# Patient Record
Sex: Female | Born: 1965
Health system: Southern US, Community
[De-identification: ages and names within clinical notes are randomized; demographics above are authoritative.]

## PROBLEM LIST (undated history)

## (undated) DIAGNOSIS — C50919 Malignant neoplasm of unspecified site of unspecified female breast: Secondary | ICD-10-CM

## (undated) DIAGNOSIS — C801 Malignant (primary) neoplasm, unspecified: Secondary | ICD-10-CM

## (undated) DIAGNOSIS — R530 Neoplastic (malignant) related fatigue: Secondary | ICD-10-CM

## (undated) DIAGNOSIS — K219 Gastro-esophageal reflux disease without esophagitis: Secondary | ICD-10-CM

## (undated) HISTORY — PX: ABDOMINAL HYSTERECTOMY: SHX81

## (undated) HISTORY — DX: Neoplastic (malignant) related fatigue: R53.0

## (undated) HISTORY — PX: OOPHORECTOMY: SHX86

## (undated) HISTORY — PX: AUGMENTATION MAMMAPLASTY: SUR837

## (undated) HISTORY — DX: Malignant (primary) neoplasm, unspecified: C80.1

## (undated) HISTORY — DX: Gastro-esophageal reflux disease without esophagitis: K21.9

---

## 2004-01-10 ENCOUNTER — Ambulatory Visit: Payer: Self-pay | Admitting: Urology

## 2004-01-14 ENCOUNTER — Ambulatory Visit: Payer: Self-pay | Admitting: Urology

## 2004-02-09 ENCOUNTER — Ambulatory Visit: Payer: Self-pay | Admitting: Unknown Physician Specialty

## 2005-09-20 ENCOUNTER — Ambulatory Visit: Payer: Self-pay | Admitting: Unknown Physician Specialty

## 2006-01-08 ENCOUNTER — Ambulatory Visit: Payer: Self-pay | Admitting: Unknown Physician Specialty

## 2006-05-09 ENCOUNTER — Ambulatory Visit: Payer: Self-pay | Admitting: Family Medicine

## 2007-11-27 ENCOUNTER — Ambulatory Visit: Payer: Self-pay | Admitting: Unknown Physician Specialty

## 2008-07-30 ENCOUNTER — Other Ambulatory Visit: Payer: Self-pay | Admitting: General Surgery

## 2008-07-30 ENCOUNTER — Ambulatory Visit: Payer: Self-pay | Admitting: General Surgery

## 2008-08-03 ENCOUNTER — Ambulatory Visit: Payer: Self-pay | Admitting: Oncology

## 2008-08-18 ENCOUNTER — Ambulatory Visit: Payer: Self-pay | Admitting: Oncology

## 2008-08-31 ENCOUNTER — Ambulatory Visit: Payer: Self-pay | Admitting: General Surgery

## 2008-09-02 ENCOUNTER — Ambulatory Visit: Payer: Self-pay | Admitting: Oncology

## 2008-10-03 ENCOUNTER — Ambulatory Visit: Payer: Self-pay | Admitting: Oncology

## 2008-11-03 ENCOUNTER — Ambulatory Visit: Payer: Self-pay | Admitting: Oncology

## 2008-12-03 ENCOUNTER — Ambulatory Visit: Payer: Self-pay | Admitting: Oncology

## 2009-01-03 ENCOUNTER — Ambulatory Visit: Payer: Self-pay | Admitting: Oncology

## 2009-02-02 ENCOUNTER — Ambulatory Visit: Payer: Self-pay | Admitting: Oncology

## 2009-03-05 ENCOUNTER — Ambulatory Visit: Payer: Self-pay | Admitting: Oncology

## 2009-03-05 HISTORY — PX: MASTECTOMY: SHX3

## 2009-03-09 ENCOUNTER — Ambulatory Visit: Payer: Self-pay | Admitting: General Surgery

## 2009-03-17 ENCOUNTER — Inpatient Hospital Stay: Payer: Self-pay

## 2009-03-17 DIAGNOSIS — C50919 Malignant neoplasm of unspecified site of unspecified female breast: Secondary | ICD-10-CM

## 2009-03-17 HISTORY — DX: Malignant neoplasm of unspecified site of unspecified female breast: C50.919

## 2009-04-05 ENCOUNTER — Ambulatory Visit: Payer: Self-pay | Admitting: Oncology

## 2009-04-20 ENCOUNTER — Ambulatory Visit: Payer: Self-pay

## 2009-05-03 ENCOUNTER — Ambulatory Visit: Payer: Self-pay | Admitting: Oncology

## 2009-06-03 ENCOUNTER — Ambulatory Visit: Payer: Self-pay | Admitting: Oncology

## 2009-07-03 ENCOUNTER — Ambulatory Visit: Payer: Self-pay | Admitting: Oncology

## 2009-08-03 ENCOUNTER — Ambulatory Visit: Payer: Self-pay | Admitting: Oncology

## 2009-09-02 ENCOUNTER — Ambulatory Visit: Payer: Self-pay | Admitting: Oncology

## 2009-09-08 ENCOUNTER — Encounter: Admission: RE | Admit: 2009-09-08 | Discharge: 2009-09-08 | Payer: Self-pay | Admitting: Oncology

## 2009-09-29 ENCOUNTER — Ambulatory Visit: Payer: Self-pay

## 2009-10-03 ENCOUNTER — Ambulatory Visit: Payer: Self-pay | Admitting: Oncology

## 2009-11-03 ENCOUNTER — Ambulatory Visit: Payer: Self-pay | Admitting: Oncology

## 2009-11-11 ENCOUNTER — Ambulatory Visit: Payer: Self-pay | Admitting: Oncology

## 2009-11-22 ENCOUNTER — Ambulatory Visit: Payer: Self-pay

## 2009-12-03 ENCOUNTER — Ambulatory Visit: Payer: Self-pay | Admitting: Oncology

## 2010-02-22 ENCOUNTER — Ambulatory Visit: Payer: Self-pay | Admitting: Oncology

## 2010-03-05 ENCOUNTER — Ambulatory Visit: Payer: Self-pay | Admitting: Oncology

## 2010-08-18 ENCOUNTER — Ambulatory Visit: Payer: Self-pay | Admitting: Unknown Physician Specialty

## 2010-08-19 ENCOUNTER — Emergency Department: Payer: Self-pay | Admitting: Emergency Medicine

## 2010-08-24 ENCOUNTER — Ambulatory Visit: Payer: Self-pay | Admitting: Oncology

## 2010-09-03 ENCOUNTER — Ambulatory Visit: Payer: Self-pay | Admitting: Oncology

## 2010-09-25 ENCOUNTER — Other Ambulatory Visit: Payer: Self-pay | Admitting: Oncology

## 2010-09-25 DIAGNOSIS — Z9012 Acquired absence of left breast and nipple: Secondary | ICD-10-CM

## 2010-10-04 ENCOUNTER — Ambulatory Visit: Payer: Self-pay | Admitting: Oncology

## 2010-10-10 ENCOUNTER — Ambulatory Visit
Admission: RE | Admit: 2010-10-10 | Discharge: 2010-10-10 | Disposition: A | Discharge status: Home / Self Care | Payer: BC Managed Care – PPO | Source: Ambulatory Visit | Attending: Oncology | Admitting: Oncology

## 2010-10-10 ENCOUNTER — Ambulatory Visit: Payer: Self-pay

## 2010-10-10 DIAGNOSIS — Z9012 Acquired absence of left breast and nipple: Secondary | ICD-10-CM

## 2010-10-10 MED ORDER — GADOBENATE DIMEGLUMINE 529 MG/ML IV SOLN
18.0000 mL | Freq: Once | INTRAVENOUS | Status: AC | PRN
  Administered 2010-10-10: 18 mL via INTRAVENOUS

## 2010-10-17 ENCOUNTER — Ambulatory Visit: Payer: Self-pay

## 2010-11-18 ENCOUNTER — Emergency Department: Payer: Self-pay | Admitting: Emergency Medicine

## 2010-11-30 ENCOUNTER — Ambulatory Visit: Payer: Self-pay | Admitting: Oncology

## 2010-12-04 ENCOUNTER — Ambulatory Visit: Payer: Self-pay | Admitting: Oncology

## 2011-03-19 ENCOUNTER — Ambulatory Visit: Payer: Self-pay | Admitting: Oncology

## 2011-03-19 LAB — CBC CANCER CENTER
Eosinophil %: 0.7 %
HCT: 38.7 % (ref 35.0–47.0)
HGB: 13.3 g/dL (ref 12.0–16.0)
Lymphocyte #: 1.9 x10 3/mm (ref 1.0–3.6)
MCV: 83 fL (ref 80–100)
Monocyte %: 7.5 %
Neutrophil #: 3.6 x10 3/mm (ref 1.4–6.5)
RBC: 4.66 10*6/uL (ref 3.80–5.20)
WBC: 6.1 x10 3/mm (ref 3.6–11.0)

## 2011-03-19 LAB — COMPREHENSIVE METABOLIC PANEL
Alkaline Phosphatase: 133 U/L (ref 50–136)
BUN: 11 mg/dL (ref 7–18)
Bilirubin,Total: 0.4 mg/dL (ref 0.2–1.0)
Chloride: 105 mmol/L (ref 98–107)
Creatinine: 0.8 mg/dL (ref 0.60–1.30)
Osmolality: 278 (ref 275–301)
Potassium: 3.8 mmol/L (ref 3.5–5.1)
SGPT (ALT): 30 U/L
Sodium: 140 mmol/L (ref 136–145)
Total Protein: 7 g/dL (ref 6.4–8.2)

## 2011-03-19 LAB — TSH: Thyroid Stimulating Horm: 1.05 u[IU]/mL

## 2011-03-20 LAB — CANCER ANTIGEN 27.29: CA 27.29: 19.2 U/mL (ref 0.0–38.6)

## 2011-03-22 ENCOUNTER — Encounter: Payer: Self-pay | Admitting: Oncology

## 2011-04-06 ENCOUNTER — Ambulatory Visit: Payer: Self-pay | Admitting: Oncology

## 2011-04-06 ENCOUNTER — Encounter: Payer: Self-pay | Admitting: Oncology

## 2011-05-04 ENCOUNTER — Encounter: Payer: Self-pay | Admitting: Oncology

## 2011-09-05 ENCOUNTER — Ambulatory Visit: Payer: Self-pay | Admitting: Oncology

## 2011-09-17 ENCOUNTER — Ambulatory Visit: Payer: Self-pay | Admitting: Oncology

## 2011-09-24 LAB — CBC CANCER CENTER
Basophil %: 0.6 %
HCT: 42.2 % (ref 35.0–47.0)
HGB: 13.8 g/dL (ref 12.0–16.0)
Lymphocyte #: 1.9 x10 3/mm (ref 1.0–3.6)
Lymphocyte %: 22.1 %
MCV: 85 fL (ref 80–100)
Monocyte %: 6.2 %
Neutrophil #: 6.2 x10 3/mm (ref 1.4–6.5)
RBC: 4.95 10*6/uL (ref 3.80–5.20)
WBC: 8.8 x10 3/mm (ref 3.6–11.0)

## 2011-09-24 LAB — COMPREHENSIVE METABOLIC PANEL
Alkaline Phosphatase: 141 U/L — ABNORMAL HIGH (ref 50–136)
BUN: 15 mg/dL (ref 7–18)
Bilirubin,Total: 0.4 mg/dL (ref 0.2–1.0)
Chloride: 104 mmol/L (ref 98–107)
Co2: 26 mmol/L (ref 21–32)
Creatinine: 0.99 mg/dL (ref 0.60–1.30)
EGFR (Non-African Amer.): >60
Osmolality: 285 (ref 275–301)
Potassium: 3.3 mmol/L — ABNORMAL LOW (ref 3.5–5.1)
SGPT (ALT): 27 U/L
Total Protein: 7.6 g/dL (ref 6.4–8.2)

## 2011-09-25 LAB — CANCER ANTIGEN 27.29: CA 27.29: 16.4 U/mL

## 2011-10-04 ENCOUNTER — Ambulatory Visit: Payer: Self-pay | Admitting: Oncology

## 2011-10-09 ENCOUNTER — Other Ambulatory Visit: Payer: Self-pay | Admitting: Oncology

## 2011-10-09 DIAGNOSIS — Z853 Personal history of malignant neoplasm of breast: Secondary | ICD-10-CM

## 2011-10-16 ENCOUNTER — Ambulatory Visit
Admission: RE | Admit: 2011-10-16 | Discharge: 2011-10-16 | Disposition: A | Discharge status: Home / Self Care | Payer: PRIVATE HEALTH INSURANCE | Source: Ambulatory Visit | Attending: Oncology | Admitting: Oncology

## 2011-10-16 DIAGNOSIS — Z853 Personal history of malignant neoplasm of breast: Secondary | ICD-10-CM

## 2011-10-16 MED ORDER — GADOBENATE DIMEGLUMINE 529 MG/ML IV SOLN
18.0000 mL | Freq: Once | INTRAVENOUS | Status: AC | PRN
  Administered 2011-10-16: 18 mL via INTRAVENOUS

## 2011-11-30 ENCOUNTER — Ambulatory Visit: Payer: Self-pay | Admitting: Family Medicine

## 2012-01-23 ENCOUNTER — Ambulatory Visit: Payer: Self-pay | Admitting: Gastroenterology

## 2012-03-05 ENCOUNTER — Ambulatory Visit: Payer: Self-pay | Admitting: Oncology

## 2012-04-02 LAB — COMPREHENSIVE METABOLIC PANEL
Albumin: 4.1 g/dL (ref 3.4–5.0)
BUN: 12 mg/dL (ref 7–18)
Bilirubin,Total: 0.3 mg/dL (ref 0.2–1.0)
Calcium, Total: 9.2 mg/dL (ref 8.5–10.1)
Co2: 31 mmol/L (ref 21–32)
EGFR (African American): >60
Osmolality: 279 (ref 275–301)
Potassium: 4 mmol/L (ref 3.5–5.1)
Sodium: 140 mmol/L (ref 136–145)

## 2012-04-02 LAB — CBC CANCER CENTER
Basophil #: 0 x10 3/mm (ref 0.0–0.1)
Lymphocyte #: 2.7 x10 3/mm (ref 1.0–3.6)
Lymphocyte %: 28.5 %
MCH: 28 pg (ref 26.0–34.0)
MCV: 83 fL (ref 80–100)
Monocyte %: 5.5 %
Neutrophil #: 6.1 x10 3/mm (ref 1.4–6.5)
Platelet: 196 x10 3/mm (ref 150–440)
RDW: 13.8 % (ref 11.5–14.5)
WBC: 9.4 x10 3/mm (ref 3.6–11.0)

## 2012-04-05 ENCOUNTER — Ambulatory Visit: Payer: Self-pay | Admitting: Oncology

## 2012-09-18 ENCOUNTER — Ambulatory Visit: Payer: Self-pay | Admitting: Oncology

## 2012-09-25 ENCOUNTER — Ambulatory Visit: Payer: Self-pay | Admitting: Oncology

## 2012-10-02 LAB — CBC CANCER CENTER
Basophil %: 0.8 %
Eosinophil #: 0.1 x10 3/mm (ref 0.0–0.7)
Eosinophil %: 1.2 %
HCT: 41.4 % (ref 35.0–47.0)
MCH: 28.4 pg (ref 26.0–34.0)
MCHC: 34.4 g/dL (ref 32.0–36.0)
MCV: 83 fL (ref 80–100)
Platelet: 208 x10 3/mm (ref 150–440)
RDW: 13.9 % (ref 11.5–14.5)
WBC: 8 x10 3/mm (ref 3.6–11.0)

## 2012-10-02 LAB — COMPREHENSIVE METABOLIC PANEL
Albumin: 4.2 g/dL (ref 3.4–5.0)
Alkaline Phosphatase: 126 U/L (ref 50–136)
Anion Gap: 7 (ref 7–16)
BUN: 12 mg/dL (ref 7–18)
Calcium, Total: 9 mg/dL (ref 8.5–10.1)
EGFR (African American): >60
Osmolality: 280 (ref 275–301)
Total Protein: 7.3 g/dL (ref 6.4–8.2)

## 2012-10-03 ENCOUNTER — Ambulatory Visit: Payer: Self-pay | Admitting: Oncology

## 2012-10-03 LAB — CANCER ANTIGEN 27.29: CA 27.29: 17.3 U/mL (ref 0.0–38.6)

## 2013-03-31 ENCOUNTER — Ambulatory Visit: Payer: Self-pay | Admitting: Oncology

## 2013-04-28 ENCOUNTER — Ambulatory Visit: Payer: Self-pay | Admitting: Oncology

## 2013-04-29 LAB — COMPREHENSIVE METABOLIC PANEL
ALK PHOS: 107 U/L
ANION GAP: 3 — AB (ref 7–16)
Albumin: 4.3 g/dL (ref 3.4–5.0)
BILIRUBIN TOTAL: 0.3 mg/dL (ref 0.2–1.0)
BUN: 16 mg/dL (ref 7–18)
CHLORIDE: 107 mmol/L (ref 98–107)
CO2: 29 mmol/L (ref 21–32)
CREATININE: 0.98 mg/dL (ref 0.60–1.30)
Calcium, Total: 9.3 mg/dL (ref 8.5–10.1)
EGFR (African American): >60
EGFR (Non-African Amer.): >60
GLUCOSE: 82 mg/dL (ref 65–99)
OSMOLALITY: 278 (ref 275–301)
POTASSIUM: 4.6 mmol/L (ref 3.5–5.1)
SGOT(AST): 25 U/L (ref 15–37)
SGPT (ALT): 25 U/L (ref 12–78)
SODIUM: 139 mmol/L (ref 136–145)
TOTAL PROTEIN: 7.4 g/dL (ref 6.4–8.2)

## 2013-04-29 LAB — CBC CANCER CENTER
BASOS PCT: 0.8 %
Basophil #: 0.1 x10 3/mm (ref 0.0–0.1)
EOS ABS: 0.2 x10 3/mm (ref 0.0–0.7)
Eosinophil %: 1.7 %
HCT: 43.8 % (ref 35.0–47.0)
HGB: 14.2 g/dL (ref 12.0–16.0)
LYMPHS PCT: 30.5 %
Lymphocyte #: 2.8 x10 3/mm (ref 1.0–3.6)
MCH: 27.3 pg (ref 26.0–34.0)
MCHC: 32.5 g/dL (ref 32.0–36.0)
MCV: 84 fL (ref 80–100)
Monocyte #: 0.7 x10 3/mm (ref 0.2–0.9)
Monocyte %: 7.2 %
Neutrophil #: 5.5 x10 3/mm (ref 1.4–6.5)
Neutrophil %: 59.8 %
PLATELETS: 213 x10 3/mm (ref 150–440)
RBC: 5.22 10*6/uL — ABNORMAL HIGH (ref 3.80–5.20)
RDW: 13.9 % (ref 11.5–14.5)
WBC: 9.2 x10 3/mm (ref 3.6–11.0)

## 2013-05-01 LAB — CANCER ANTIGEN 27.29: CA 27.29: 22.3 U/mL (ref 0.0–38.6)

## 2013-05-03 ENCOUNTER — Ambulatory Visit: Payer: Self-pay | Admitting: Oncology

## 2013-09-22 ENCOUNTER — Ambulatory Visit: Payer: Self-pay | Admitting: Oncology

## 2013-10-26 ENCOUNTER — Ambulatory Visit: Payer: Self-pay | Admitting: Oncology

## 2013-10-26 LAB — URINALYSIS, COMPLETE
Bilirubin,UR: NEGATIVE
Blood: NEGATIVE
Glucose,UR: NEGATIVE mg/dL (ref 0–75)
KETONE: NEGATIVE
NITRITE: NEGATIVE
Ph: 7 (ref 4.5–8.0)
Protein: NEGATIVE
SPECIFIC GRAVITY: 1.015 (ref 1.003–1.030)

## 2013-10-28 LAB — CBC CANCER CENTER
BASOS ABS: 0.1 x10 3/mm (ref 0.0–0.1)
BASOS PCT: 0.9 %
EOS ABS: 0.1 x10 3/mm (ref 0.0–0.7)
EOS PCT: 0.9 %
HCT: 41.8 % (ref 35.0–47.0)
HGB: 13.6 g/dL (ref 12.0–16.0)
LYMPHS PCT: 25.5 %
Lymphocyte #: 2.3 x10 3/mm (ref 1.0–3.6)
MCH: 27.6 pg (ref 26.0–34.0)
MCHC: 32.7 g/dL (ref 32.0–36.0)
MCV: 85 fL (ref 80–100)
MONOS PCT: 6.4 %
Monocyte #: 0.6 x10 3/mm (ref 0.2–0.9)
NEUTROS ABS: 6 x10 3/mm (ref 1.4–6.5)
NEUTROS PCT: 66.3 %
Platelet: 214 x10 3/mm (ref 150–440)
RBC: 4.94 10*6/uL (ref 3.80–5.20)
RDW: 13.8 % (ref 11.5–14.5)
WBC: 9.1 x10 3/mm (ref 3.6–11.0)

## 2013-10-28 LAB — COMPREHENSIVE METABOLIC PANEL
ALBUMIN: 3.7 g/dL (ref 3.4–5.0)
ANION GAP: 9 (ref 7–16)
AST: 16 U/L (ref 15–37)
Alkaline Phosphatase: 100 U/L
BUN: 16 mg/dL (ref 7–18)
Bilirubin,Total: 0.3 mg/dL (ref 0.2–1.0)
CALCIUM: 8.9 mg/dL (ref 8.5–10.1)
CHLORIDE: 103 mmol/L (ref 98–107)
Co2: 29 mmol/L (ref 21–32)
Creatinine: 0.89 mg/dL (ref 0.60–1.30)
GLUCOSE: 81 mg/dL (ref 65–99)
OSMOLALITY: 281 (ref 275–301)
POTASSIUM: 4.5 mmol/L (ref 3.5–5.1)
SGPT (ALT): 21 U/L
Sodium: 141 mmol/L (ref 136–145)
TOTAL PROTEIN: 7.1 g/dL (ref 6.4–8.2)

## 2013-10-29 LAB — URINE CULTURE

## 2013-11-03 ENCOUNTER — Ambulatory Visit: Payer: Self-pay | Admitting: Oncology

## 2014-06-03 ENCOUNTER — Ambulatory Visit
Admit: 2014-06-03 | Disposition: A | Discharge status: Home / Self Care | Payer: Self-pay | Attending: Oncology | Admitting: Oncology

## 2014-06-03 LAB — COMPREHENSIVE METABOLIC PANEL
ALK PHOS: 88 U/L
ALT: 20 U/L
Albumin: 4.4 g/dL
Anion Gap: 6 — ABNORMAL LOW (ref 7–16)
BILIRUBIN TOTAL: 0.6 mg/dL
BUN: 24 mg/dL — AB
Calcium, Total: 9.1 mg/dL
Chloride: 104 mmol/L
Co2: 25 mmol/L
Creatinine: 0.79 mg/dL
EGFR (African American): >60
EGFR (Non-African Amer.): >60
Glucose: 96 mg/dL
POTASSIUM: 3.8 mmol/L
SGOT(AST): 21 U/L
Sodium: 135 mmol/L
TOTAL PROTEIN: 7.1 g/dL

## 2014-06-03 LAB — CBC CANCER CENTER
BASOS ABS: 0.1 x10 3/mm (ref 0.0–0.1)
BASOS PCT: 0.7 %
EOS ABS: 0.1 x10 3/mm (ref 0.0–0.7)
Eosinophil %: 1.2 %
HCT: 42.1 % (ref 35.0–47.0)
HGB: 14 g/dL (ref 12.0–16.0)
LYMPHS PCT: 28.6 %
Lymphocyte #: 2.3 x10 3/mm (ref 1.0–3.6)
MCH: 27.5 pg (ref 26.0–34.0)
MCHC: 33.2 g/dL (ref 32.0–36.0)
MCV: 83 fL (ref 80–100)
MONOS PCT: 7.4 %
Monocyte #: 0.6 x10 3/mm (ref 0.2–0.9)
NEUTROS ABS: 4.9 x10 3/mm (ref 1.4–6.5)
NEUTROS PCT: 62.1 %
Platelet: 194 x10 3/mm (ref 150–440)
RBC: 5.08 10*6/uL (ref 3.80–5.20)
RDW: 14.3 % (ref 11.5–14.5)
WBC: 7.9 x10 3/mm (ref 3.6–11.0)

## 2014-06-04 ENCOUNTER — Ambulatory Visit
Admit: 2014-06-04 | Disposition: A | Discharge status: Home / Self Care | Payer: Self-pay | Attending: Oncology | Admitting: Oncology

## 2014-07-07 ENCOUNTER — Other Ambulatory Visit: Payer: Self-pay | Admitting: *Deleted

## 2014-07-07 DIAGNOSIS — C50919 Malignant neoplasm of unspecified site of unspecified female breast: Secondary | ICD-10-CM

## 2014-07-12 ENCOUNTER — Other Ambulatory Visit: Payer: Self-pay | Admitting: Oncology

## 2014-07-12 DIAGNOSIS — Z1231 Encounter for screening mammogram for malignant neoplasm of breast: Secondary | ICD-10-CM

## 2014-09-16 ENCOUNTER — Encounter: Payer: Self-pay | Admitting: Family Medicine

## 2014-09-16 ENCOUNTER — Ambulatory Visit (INDEPENDENT_AMBULATORY_CARE_PROVIDER_SITE_OTHER): Payer: 59 | Admitting: Family Medicine

## 2014-09-16 VITALS — BP 110/74 | HR 71 | Resp 16 | Ht 66.0 in | Wt 215.6 lb

## 2014-09-16 DIAGNOSIS — Z7189 Other specified counseling: Secondary | ICD-10-CM

## 2014-09-16 DIAGNOSIS — D0592 Unspecified type of carcinoma in situ of left breast: Secondary | ICD-10-CM | POA: Diagnosis not present

## 2014-09-16 DIAGNOSIS — K219 Gastro-esophageal reflux disease without esophagitis: Secondary | ICD-10-CM | POA: Diagnosis not present

## 2014-09-16 DIAGNOSIS — N3000 Acute cystitis without hematuria: Secondary | ICD-10-CM

## 2014-09-16 DIAGNOSIS — R109 Unspecified abdominal pain: Secondary | ICD-10-CM

## 2014-09-16 DIAGNOSIS — Z8249 Family history of ischemic heart disease and other diseases of the circulatory system: Secondary | ICD-10-CM | POA: Diagnosis not present

## 2014-09-16 DIAGNOSIS — Z7689 Persons encountering health services in other specified circumstances: Secondary | ICD-10-CM

## 2014-09-16 LAB — POCT URINALYSIS DIPSTICK
BILIRUBIN UA: NEGATIVE
Glucose, UA: NEGATIVE
Ketones, UA: NEGATIVE
NITRITE UA: POSITIVE
PH UA: 5
PROTEIN UA: NEGATIVE
SPEC GRAV UA: 1.01
UROBILINOGEN UA: NEGATIVE

## 2014-09-16 MED ORDER — NITROFURANTOIN MONOHYD MACRO 100 MG PO CAPS: 100.0000 mg | ORAL_CAPSULE | Freq: Two times a day (BID) | ORAL | Status: DC

## 2014-09-16 NOTE — Progress Notes (Signed)
Subjective:    Patient ID: Natasha Dennis, female    DOB: 12/19/1965, 49 y.o.   MRN: [id number]  HPI: Natasha Dennis is a 49 y.o. female presenting on 09/16/2014 for Establish Care   HPI  Pt presents to establish care today. Pt had a previous PCP at [facility], and treated by Dr. Dionne Milo for breast cancer, diagnosed in May 2010. L masectomy and reconstructive surgery done.  Currently medical problems: Acid Reflux: Occasional, treats with prilosec or OTC tagement. No regurg, dysphagia, or blood vomit.  Pt reporting 3 days of flank pain with concentrated urine. No foul smell or blood. No dysuria, burning, or pelvic pain.   Hysterectomy age 30- removed for abnormal pap smears. Has not has pap smear done in several years. Oophrectomy- 2013.  Mammograms: Will be done next Friday.  Colonoscopy: Done in 3 years ago- normal  Past Medical History  Diagnosis Date  . Cancer   . GERD (gastroesophageal reflux disease)    History   Social History  . Marital Status: Married    Spouse Name: N/A  . Number of Children: N/A  . Years of Education: N/A   Occupational History  . Not on file.   Social History Main Topics  . Smoking status: Former Smoker    Quit date: 03/05/1984  . Smokeless tobacco: Never Used  . Alcohol Use: No  . Drug Use: No  . Sexual Activity: Not on file   Other Topics Concern  . Not on file   Social History Narrative  . No narrative on file   Family History  Problem Relation Age of Onset  . Diabetes Mother   . Arthritis Mother   . Heart disease Father    No current outpatient prescriptions on file prior to visit.   No current facility-administered medications on file prior to visit.    Review of Systems  Constitutional: Negative for chills and fatigue.  Respiratory: Negative for chest tightness and shortness of breath.   Cardiovascular: Negative for chest pain, palpitations and leg swelling.  Genitourinary: Positive for flank pain. Negative for  dysuria, urgency and difficulty urinating.  Skin: Negative.   Neurological: Negative.   Psychiatric/Behavioral: Negative.    Per HPI unless specifically indicated above     Objective:    BP 110/74 mmHg  Pulse 71  Resp 16  Ht 5\' 6"  (1.676 m)  Wt 215 lb 9.6 oz (97.796 kg)  BMI 34.82 kg/m2  Wt Readings from Last 3 Encounters:  09/16/14 215 lb 9.6 oz (97.796 kg)    Physical Exam  Constitutional: She is oriented to person, place, and time. She appears well-developed and well-nourished.  HENT:  Head: Normocephalic and atraumatic.  Neck: Neck supple.  Cardiovascular: Normal rate, regular rhythm and normal heart sounds.  Exam reveals no gallop and no friction rub.   No murmur heard. Pulmonary/Chest: Effort normal and breath sounds normal. She has no wheezes. She exhibits no tenderness.  Abdominal: Soft. Normal appearance and bowel sounds are normal. She exhibits no distension and no mass. There is no tenderness. There is no rebound and no guarding.  Musculoskeletal: Normal range of motion. She exhibits no edema or tenderness.  Lymphadenopathy:    She has no cervical adenopathy.  Neurological: She is alert and oriented to person, place, and time.  Skin: Skin is warm and dry.   Results for orders placed or performed in visit on 09/16/14  POCT Urinalysis Dipstick  Result Value Ref Range   Color, UA dark  Clarity, UA cloudy    Glucose, UA negative    Bilirubin, UA negative    Ketones, UA negative    Spec Grav, UA 1.010    Blood, UA trace    pH, UA 5.0    Protein, UA negative    Urobilinogen, UA negative    Nitrite, UA positive    Leukocytes, UA Trace (A) Negative      Assessment & Plan:   Problem List Items Addressed This Visit      Digestive   GERD (gastroesophageal reflux disease)   Relevant Medications   omeprazole (PRILOSEC) 20 MG capsule   Other Relevant Orders   Comprehensive Metabolic Panel (CMET)    Other Visit Diagnoses    Encounter to establish care    -   Primary    Breast cancer in situ, left        Diagnosed in 2010. L masectomy. Was previously on tamoxifen. Stopped 6 mos ago.     Right flank pain        Relevant Orders    POCT Urinalysis Dipstick (Completed)    Acute cystitis without hematuria        + Leukocytes, nitrites, and blood. Treat for UTI. Sent for culture. Alarm symptoms reviewed.     Relevant Medications    nitrofurantoin, macrocrystal-monohydrate, (MACROBID) 100 MG capsule    Other Relevant Orders    CULTURE, URINE COMPREHENSIVE    Family history of heart disease        Baseline lipid panel.     Relevant Orders    Lipid Profile       Meds ordered this encounter  Medications  . cholecalciferol (VITAMIN D) 1000 UNITS tablet    Sig: Take 1,000 Units by mouth daily.  Marland Kitchen omeprazole (PRILOSEC) 20 MG capsule    Sig: Take 20 mg by mouth daily.  . nitrofurantoin, macrocrystal-monohydrate, (MACROBID) 100 MG capsule    Sig: Take 1 capsule (100 mg total) by mouth 2 (two) times daily.    Dispense:  14 capsule    Refill:  0    Order Specific Question:  Supervising Provider    Answer:  Arlis Porta [[id number]]      Follow up plan: Return if symptoms worsen or fail to improve.

## 2014-09-16 NOTE — Patient Instructions (Signed)

## 2014-09-18 LAB — CULTURE, URINE COMPREHENSIVE

## 2014-09-20 ENCOUNTER — Telehealth: Payer: Self-pay

## 2014-09-20 NOTE — Telephone Encounter (Signed)
-----   Message from Luciana Axe, NP sent at 09/20/2014  8:10 AM EDT ----- Please let her know, her urine grew E. Coli. It is susceptible to the antibiotic she was placed on. Continue current course of macrobid. Thanks! AK

## 2014-09-20 NOTE — Telephone Encounter (Signed)
LMTCB

## 2014-09-21 NOTE — Progress Notes (Signed)
Pt advised as per Amy with her test result.

## 2014-09-24 ENCOUNTER — Other Ambulatory Visit: Payer: Self-pay | Admitting: Oncology

## 2014-09-24 ENCOUNTER — Ambulatory Visit
Admission: RE | Admit: 2014-09-24 | Discharge: 2014-09-24 | Disposition: A | Discharge status: Home / Self Care | Payer: 59 | Source: Ambulatory Visit | Attending: Oncology | Admitting: Oncology

## 2014-09-24 DIAGNOSIS — Z1231 Encounter for screening mammogram for malignant neoplasm of breast: Secondary | ICD-10-CM | POA: Diagnosis present

## 2014-09-24 HISTORY — DX: Malignant neoplasm of unspecified site of unspecified female breast: C50.919

## 2014-11-26 ENCOUNTER — Other Ambulatory Visit: Payer: Self-pay | Admitting: *Deleted

## 2014-11-26 DIAGNOSIS — C50919 Malignant neoplasm of unspecified site of unspecified female breast: Secondary | ICD-10-CM

## 2014-12-02 ENCOUNTER — Inpatient Hospital Stay: Payer: 59 | Admitting: Oncology

## 2014-12-02 ENCOUNTER — Inpatient Hospital Stay: Payer: 59

## 2014-12-06 ENCOUNTER — Inpatient Hospital Stay: Payer: 59 | Admitting: Oncology

## 2014-12-06 ENCOUNTER — Inpatient Hospital Stay: Payer: 59 | Attending: Oncology

## 2015-01-03 ENCOUNTER — Inpatient Hospital Stay: Payer: 59

## 2015-01-03 ENCOUNTER — Inpatient Hospital Stay: Payer: 59 | Admitting: Oncology

## 2015-01-05 ENCOUNTER — Other Ambulatory Visit: Payer: Self-pay

## 2015-01-05 ENCOUNTER — Other Ambulatory Visit: Payer: Self-pay | Admitting: Family Medicine

## 2015-01-05 DIAGNOSIS — E785 Hyperlipidemia, unspecified: Secondary | ICD-10-CM

## 2015-01-11 ENCOUNTER — Inpatient Hospital Stay: Payer: 59

## 2015-01-11 ENCOUNTER — Encounter: Payer: Self-pay | Admitting: Oncology

## 2015-01-11 ENCOUNTER — Inpatient Hospital Stay: Payer: 59 | Attending: Oncology | Admitting: Oncology

## 2015-01-11 VITALS — BP 128/83 | HR 81 | Temp 96.5°F | Wt 216.1 lb

## 2015-01-11 DIAGNOSIS — Z79899 Other long term (current) drug therapy: Secondary | ICD-10-CM | POA: Insufficient documentation

## 2015-01-11 DIAGNOSIS — Z9012 Acquired absence of left breast and nipple: Secondary | ICD-10-CM | POA: Insufficient documentation

## 2015-01-11 DIAGNOSIS — Z808 Family history of malignant neoplasm of other organs or systems: Secondary | ICD-10-CM | POA: Insufficient documentation

## 2015-01-11 DIAGNOSIS — Z9221 Personal history of antineoplastic chemotherapy: Secondary | ICD-10-CM | POA: Diagnosis not present

## 2015-01-11 DIAGNOSIS — K219 Gastro-esophageal reflux disease without esophagitis: Secondary | ICD-10-CM | POA: Diagnosis not present

## 2015-01-11 DIAGNOSIS — C50919 Malignant neoplasm of unspecified site of unspecified female breast: Secondary | ICD-10-CM

## 2015-01-11 DIAGNOSIS — Z853 Personal history of malignant neoplasm of breast: Secondary | ICD-10-CM | POA: Insufficient documentation

## 2015-01-11 DIAGNOSIS — Z87891 Personal history of nicotine dependence: Secondary | ICD-10-CM | POA: Insufficient documentation

## 2015-01-11 LAB — CBC WITH DIFFERENTIAL/PLATELET
Basophils Absolute: 0 10*3/uL (ref 0–0.1)
Basophils Relative: 1 %
EOS ABS: 0.1 10*3/uL (ref 0–0.7)
EOS PCT: 1 %
HCT: 45 % (ref 35.0–47.0)
Hemoglobin: 15 g/dL (ref 12.0–16.0)
LYMPHS ABS: 2.1 10*3/uL (ref 1.0–3.6)
Lymphocytes Relative: 24 %
MCH: 27.5 pg (ref 26.0–34.0)
MCHC: 33.3 g/dL (ref 32.0–36.0)
MCV: 82.6 fL (ref 80.0–100.0)
Monocytes Absolute: 0.5 10*3/uL (ref 0.2–0.9)
Monocytes Relative: 6 %
Neutro Abs: 6 10*3/uL (ref 1.4–6.5)
Neutrophils Relative %: 68 %
PLATELETS: 217 10*3/uL (ref 150–440)
RBC: 5.46 MIL/uL — AB (ref 3.80–5.20)
RDW: 13.7 % (ref 11.5–14.5)
WBC: 8.8 10*3/uL (ref 3.6–11.0)

## 2015-01-11 LAB — COMPREHENSIVE METABOLIC PANEL
ALT: 19 U/L (ref 14–54)
AST: 21 U/L (ref 15–41)
Albumin: 4.7 g/dL (ref 3.5–5.0)
Alkaline Phosphatase: 103 U/L (ref 38–126)
Anion gap: 7 (ref 5–15)
BUN: 13 mg/dL (ref 6–20)
CALCIUM: 9.7 mg/dL (ref 8.9–10.3)
CHLORIDE: 102 mmol/L (ref 101–111)
CO2: 26 mmol/L (ref 22–32)
CREATININE: 0.79 mg/dL (ref 0.44–1.00)
GFR calc non Af Amer: >60 mL/min (ref >60)
Glucose, Bld: 102 mg/dL — ABNORMAL HIGH (ref 65–99)
Potassium: 4.2 mmol/L (ref 3.5–5.1)
SODIUM: 135 mmol/L (ref 135–145)
TOTAL PROTEIN: 7.6 g/dL (ref 6.5–8.1)
Total Bilirubin: 0.5 mg/dL (ref 0.3–1.2)

## 2015-01-11 NOTE — Progress Notes (Signed)
Natasha Dennis @ [facility] [phone]  Fax:([phone]) [place]: [birth year]  MR#: [id number]  JYN#:[id number]  Patient Care Team: Luciana Axe, NP as PCP - General (Family Medicine)  CHIEF COMPLAINT:  Chief Complaint  Patient presents with  . OTHER   Chief Complaint/Diagnosis:   Invasive lobular carcinoma, currently on NASBP protocol. on tamoxifen (switch from Femara) in July of 2012 now on Femara 2.5 mg by mouth dailyfrom October of 2012 discontinued Femara because of bony pains July of 2013  Started on Aromasin from July of 2013 Patient has stopped taking Aromasin because of perceived side effect (total duration off anti-hormonal therapy was folded in half years)   INTERVAL HISTORY: Patient is here for further follow-up regarding carcinoma of left breast.  T2 N1 M0 tumor status post chemotherapy under NSABP protocol.  An abbreviated anti-hormonal therapy Had a regular mammogram of the right breast. Patient is asymptomatic at present time.  Does not smoke. Does not take any flu vaccine is previous side effects from the flu vaccine.  REVIEW OF SYSTEMS:   GENERAL:  Feels good.  Active.  No fevers, sweats or weight loss. PERFORMANCE STATUS (ECOG): 0 HEENT:  No visual changes, runny nose, sore throat, mouth sores or tenderness. Lungs: No shortness of breath or cough.  No hemoptysis. Cardiac:  No chest pain, palpitations, orthopnea, or PND. GI:  No nausea, vomiting, diarrhea, constipation, melena or hematochezia. GU:  No urgency, frequency, dysuria, or hematuria. Musculoskeletal:  No back pain.  No joint pain.  No muscle tenderness. Extremities:  No pain or swelling. Skin:  No rashes or skin changes. Neuro:  No headache, numbness or weakness, balance or coordination issues. Endocrine:  No diabetes, thyroid issues, hot flashes or night sweats. Psych:  No mood changes, depression or anxiety. Pain:  No focal pain. Review of systems:  All other  systems reviewed and found to be negative. As per HPI. Otherwise, a complete review of systems is negatve.  PAST MEDICAL HISTORY: Past Medical History  Diagnosis Date  . Cancer   . GERD (gastroesophageal reflux disease)   . Breast cancer 03-17-2009    lt Mastectomy    PAST SURGICAL HISTORY: Past Surgical History  Procedure Laterality Date  . Abdominal hysterectomy    . Mastectomy Left 2011  . Augmentation mammaplasty      rt implant    FAMILY HISTORY Family History  Problem Relation Age of Onset  . Diabetes Mother   . Arthritis Mother   . Heart disease Father   Significant History/PMH:   Oncology Protocol: NSABP B40  Call [phone] and speak with someone in the Research department or contact the MD on call   Breast cancer:    hysterectomy:    left mastectomy:    Insertion Porta Cath: 2010   left breast biopsy:   Preventive Screening:  Has patient had any of the following test? Mammography (1)   Last Mammography: july 2014(1)   PFSH: Family History: history of cancer of cervix, uterus, melanoma in the family  Comments: does not smoke, does not  drink  Additional Past Medical and Surgical History: partial  hysterectomy    ADVANCED DIRECTIVES:  No flowsheet data found.  HEALTH MAINTENANCE: Social History  Substance Use Topics  . Smoking status: Former Smoker    Quit date: 03/05/1984  . Smokeless tobacco: Never Used  . Alcohol Use: No      No Known Allergies  Current Outpatient Prescriptions  Medication Sig  Dispense Refill  . cholecalciferol (VITAMIN D) 1000 UNITS tablet Take 1,000 Units by mouth daily.    . nitrofurantoin, macrocrystal-monohydrate, (MACROBID) 100 MG capsule Take 1 capsule (100 mg total) by mouth 2 (two) times daily. 14 capsule 0  . omeprazole (PRILOSEC) 20 MG capsule Take 20 mg by mouth daily.     No current facility-administered medications for this visit.    OBJECTIVE:  Filed Vitals:   01/11/15 1126  BP: 128/83    Pulse: 81  Temp: 96.5 F (35.8 C)     Body mass index is 34.89 kg/(m^2).    ECOG FS:0 - Asymptomatic  PHYSICAL EXAM: General  status: Performance status is good.  Patient has not lost significant weight. Since last evaluation there is no significant change in the general status HEENT: No evidence of stomatitis. Sclera and conjunctivae :: No jaundice.   pale looking. Lungs: Air  entry equal on both sides.  No rhonchi.  No rales.  Cardiac: Heart sounds are normal.  No pericardial rub.  No murmur. Lymphatic system: Cervical, axillary, inguinal, lymph nodes not palpable GI: Abdomen is soft.liver and spleen not palpable.  No ascites.  Bowel sounds are normal.  No other palpable masses.  No tenderness . Lower extremity: No edema Neurological system: Higher functions, cranial nerves intact no evidence of peripheral neuropathy. Skin: No rash.  No ecchymosis.. No petechial hemorrhages Examination of right breast: Patient had a normal breast tissue plus implant to augment the breast.  There is no evidence of any palpable masses or axillary area without any enlarged lymph node. Lab breast status post mastectomy and TRAM flap reconstruction.  No evidence of palpable masses.   LAB RESULTS:  CBC Latest Ref Rng 01/11/2015 06/03/2014  WBC 3.6 - 11.0 K/uL 8.8 7.9  Hemoglobin 12.0 - 16.0 g/dL 15.0 14.0  Hematocrit 35.0 - 47.0 % 45.0 42.1  Platelets 150 - 440 K/uL 217 194    Appointment on 01/11/2015  Component Date Value Ref Range Status  . WBC 01/11/2015 8.8  3.6 - 11.0 K/uL Final  . RBC 01/11/2015 5.46* 3.80 - 5.20 MIL/uL Final  . Hemoglobin 01/11/2015 15.0  12.0 - 16.0 g/dL Final  . HCT 01/11/2015 45.0  35.0 - 47.0 % Final  . MCV 01/11/2015 82.6  80.0 - 100.0 fL Final  . MCH 01/11/2015 27.5  26.0 - 34.0 pg Final  . MCHC 01/11/2015 33.3  32.0 - 36.0 g/dL Final  . RDW 01/11/2015 13.7  11.5 - 14.5 % Final  . Platelets 01/11/2015 217  150 - 440 K/uL Final  . Neutrophils Relative % 01/11/2015 68    Final  . Neutro Abs 01/11/2015 6.0  1.4 - 6.5 K/uL Final  . Lymphocytes Relative 01/11/2015 24   Final  . Lymphs Abs 01/11/2015 2.1  1.0 - 3.6 K/uL Final  . Monocytes Relative 01/11/2015 6   Final  . Monocytes Absolute 01/11/2015 0.5  0.2 - 0.9 K/uL Final  . Eosinophils Relative 01/11/2015 1   Final  . Eosinophils Absolute 01/11/2015 0.1  0 - 0.7 K/uL Final  . Basophils Relative 01/11/2015 1   Final  . Basophils Absolute 01/11/2015 0.0  0 - 0.1 K/uL Final  . Sodium 01/11/2015 135  135 - 145 mmol/L Final  . Potassium 01/11/2015 4.2  3.5 - 5.1 mmol/L Final  . Chloride 01/11/2015 102  101 - 111 mmol/L Final  . CO2 01/11/2015 26  22 - 32 mmol/L Final  . Glucose, Bld 01/11/2015 102* 65 - 99 mg/dL  Final  . BUN 01/11/2015 13  6 - 20 mg/dL Final  . Creatinine, Ser 01/11/2015 0.79  0.44 - 1.00 mg/dL Final  . Calcium 01/11/2015 9.7  8.9 - 10.3 mg/dL Final  . Total Protein 01/11/2015 7.6  6.5 - 8.1 g/dL Final  . Albumin 01/11/2015 4.7  3.5 - 5.0 g/dL Final  . AST 01/11/2015 21  15 - 41 U/L Final  . ALT 01/11/2015 19  14 - 54 U/L Final  . Alkaline Phosphatase 01/11/2015 103  38 - 126 U/L Final  . Total Bilirubin 01/11/2015 0.5  0.3 - 1.2 mg/dL Final  . GFR calc non Af Amer 01/11/2015 >60  >60 mL/min Final  . GFR calc Af Amer 01/11/2015 >60  >60 mL/min Final   Comment: (NOTE) The eGFR has been calculated using the CKD EPI equation. This calculation has not been validated in all clinical situations. eGFR's persistently <60 mL/min signify possible Chronic Kidney Disease.   . Anion gap 01/11/2015 7  5 - 15 Final    Mammogram was done in July of 2016 IMPRESSION: No mammographic evidence of malignancy. A result letter of this screening mammogram will be mailed directly to the patient.  RECOMMENDATION: Screening mammogram in one year. (Code:SM-R-01Y)  BI-RADS CATEGORY 1: Negative.   ASSESSMENT: 1. Carcinoma of left breast there is no evidence of recurrent disease status post  mastectomy and chemotherapy under NSABP protocol with an abbreviated anti-hormonal therapy. Patient had reconstructive surgery done with a TRAM flap  Patient had her right breast augmentation with implant.  (Significant breast tissue remains)  No evidence of recurrent disease we will proceed with another mammogram of the right breast in 1 year All lab data has been reviewed it and evaluation in one year    Patient expressed understanding and was in agreement with this plan. She also understands that She can call clinic at any time with any questions, concerns, or complaints.    No matching staging information was found for the patient.  Forest Gleason, MD   01/11/2015 11:34 AM

## 2015-01-14 ENCOUNTER — Other Ambulatory Visit
Admission: RE | Admit: 2015-01-14 | Discharge: 2015-01-14 | Disposition: A | Discharge status: Home / Self Care | Payer: 59 | Source: Ambulatory Visit | Attending: Family Medicine | Admitting: Family Medicine

## 2015-01-14 DIAGNOSIS — E785 Hyperlipidemia, unspecified: Secondary | ICD-10-CM | POA: Diagnosis present

## 2015-01-14 LAB — LIPID PANEL
CHOLESTEROL: 203 mg/dL — AB (ref 0–200)
HDL: 51 mg/dL (ref >40)
LDL Cholesterol: 140 mg/dL — ABNORMAL HIGH (ref 0–99)
TRIGLYCERIDES: 60 mg/dL (ref <150)
Total CHOL/HDL Ratio: 4 RATIO
VLDL: 12 mg/dL (ref 0–40)

## 2015-09-26 ENCOUNTER — Other Ambulatory Visit: Payer: Self-pay | Admitting: Oncology

## 2015-09-26 ENCOUNTER — Ambulatory Visit
Admission: RE | Admit: 2015-09-26 | Discharge: 2015-09-26 | Disposition: A | Discharge status: Home / Self Care | Payer: 59 | Source: Ambulatory Visit | Attending: Oncology | Admitting: Oncology

## 2015-09-26 DIAGNOSIS — C50919 Malignant neoplasm of unspecified site of unspecified female breast: Secondary | ICD-10-CM | POA: Diagnosis not present

## 2015-09-26 DIAGNOSIS — Z1231 Encounter for screening mammogram for malignant neoplasm of breast: Secondary | ICD-10-CM | POA: Diagnosis not present

## 2015-09-28 ENCOUNTER — Ambulatory Visit (INDEPENDENT_AMBULATORY_CARE_PROVIDER_SITE_OTHER): Payer: 59 | Admitting: Family Medicine

## 2015-09-28 ENCOUNTER — Other Ambulatory Visit: Payer: Self-pay | Admitting: Family Medicine

## 2015-09-28 VITALS — BP 113/73 | HR 75 | Temp 98.7°F | Resp 16 | Ht 66.0 in | Wt 215.6 lb

## 2015-09-28 DIAGNOSIS — Z01419 Encounter for gynecological examination (general) (routine) without abnormal findings: Secondary | ICD-10-CM

## 2015-09-28 DIAGNOSIS — C50912 Malignant neoplasm of unspecified site of left female breast: Secondary | ICD-10-CM | POA: Diagnosis not present

## 2015-09-28 DIAGNOSIS — Z124 Encounter for screening for malignant neoplasm of cervix: Secondary | ICD-10-CM

## 2015-09-28 DIAGNOSIS — R635 Abnormal weight gain: Secondary | ICD-10-CM | POA: Diagnosis not present

## 2015-09-28 DIAGNOSIS — M8580 Other specified disorders of bone density and structure, unspecified site: Secondary | ICD-10-CM

## 2015-09-28 NOTE — Patient Instructions (Addendum)
Health Maintenance, Female Adopting a healthy lifestyle and getting preventive care can go a long way to promote health and wellness. Talk with your health care provider about what schedule of regular examinations is right for you. This is a good chance for you to check in with your provider about disease prevention and staying healthy. In between checkups, there are plenty of things you can do on your own. Experts have done a lot of research about which lifestyle changes and preventive measures are most likely to keep you healthy. Ask your health care provider for more information. WEIGHT AND DIET  Eat a healthy diet  Be sure to include plenty of vegetables, fruits, low-fat dairy products, and lean protein.  Do not eat a lot of foods high in solid fats, added sugars, or salt.  Get regular exercise. This is one of the most important things you can do for your health.  Most adults should exercise for at least 150 minutes each week. The exercise should increase your heart rate and make you sweat (moderate-intensity exercise).  Most adults should also do strengthening exercises at least twice a week. This is in addition to the moderate-intensity exercise.  Maintain a healthy weight  Body mass index (BMI) is a measurement that can be used to identify possible weight problems. It estimates body fat based on height and weight. Your health care provider can help determine your BMI and help you achieve or maintain a healthy weight.  For females 20 years of age and older:   A BMI below 18.5 is considered underweight.  A BMI of 18.5 to 24.9 is normal.  A BMI of 25 to 29.9 is considered overweight.  A BMI of 30 and above is considered obese.  Watch levels of cholesterol and blood lipids  You should start having your blood tested for lipids and cholesterol at 52 years of age, then have this test every 5 years.  You may need to have your cholesterol levels checked more often if:  Your lipid  or cholesterol levels are high.  You are older than 52 years of age.  You are at high risk for heart disease.  CANCER SCREENING   Lung Cancer  Lung cancer screening is recommended for adults 55-80 years old who are at high risk for lung cancer because of a history of smoking.  A yearly low-dose CT scan of the lungs is recommended for people who:  Currently smoke.  Have quit within the past 15 years.  Have at least a 30-pack-year history of smoking. A pack year is smoking an average of one pack of cigarettes a day for 1 year.  Yearly screening should continue until it has been 15 years since you quit.  Yearly screening should stop if you develop a health problem that would prevent you from having lung cancer treatment.  Breast Cancer  Practice breast self-awareness. This means understanding how your breasts normally appear and feel.  It also means doing regular breast self-exams. Let your health care provider know about any changes, no matter how small.  If you are in your 20s or 30s, you should have a clinical breast exam (CBE) by a health care provider every 1-3 years as part of a regular health exam.  If you are 40 or older, have a CBE every year. Also consider having a breast X-ray (mammogram) every year.  If you have a family history of breast cancer, talk to your health care provider about genetic screening.  If you   are at high risk for breast cancer, talk to your health care provider about having an MRI and a mammogram every year.  Breast cancer gene (BRCA) assessment is recommended for women who have family members with BRCA-related cancers. BRCA-related cancers include:  Breast.  Ovarian.  Tubal.  Peritoneal cancers.  Results of the assessment will determine the need for genetic counseling and BRCA1 and BRCA2 testing. Cervical Cancer Your health care provider may recommend that you be screened regularly for cancer of the pelvic organs (ovaries, uterus, and  vagina). This screening involves a pelvic examination, including checking for microscopic changes to the surface of your cervix (Pap test). You may be encouraged to have this screening done every 3 years, beginning at age 21.  For women ages 30-65, health care providers may recommend pelvic exams and Pap testing every 3 years, or they may recommend the Pap and pelvic exam, combined with testing for human papilloma virus (HPV), every 5 years. Some types of HPV increase your risk of cervical cancer. Testing for HPV may also be done on women of any age with unclear Pap test results.  Other health care providers may not recommend any screening for nonpregnant women who are considered low risk for pelvic cancer and who do not have symptoms. Ask your health care provider if a screening pelvic exam is right for you.  If you have had past treatment for cervical cancer or a condition that could lead to cancer, you need Pap tests and screening for cancer for at least 20 years after your treatment. If Pap tests have been discontinued, your risk factors (such as having a new sexual partner) need to be reassessed to determine if screening should resume. Some women have medical problems that increase the chance of getting cervical cancer. In these cases, your health care provider may recommend more frequent screening and Pap tests. Colorectal Cancer  This type of cancer can be detected and often prevented.  Routine colorectal cancer screening usually begins at 52 years of age and continues through 52 years of age.  Your health care provider may recommend screening at an earlier age if you have risk factors for colon cancer.  Your health care provider may also recommend using home test kits to check for hidden blood in the stool.  A small camera at the end of a tube can be used to examine your colon directly (sigmoidoscopy or colonoscopy). This is done to check for the earliest forms of colorectal  cancer.  Routine screening usually begins at age 50.  Direct examination of the colon should be repeated every 5-10 years through 52 years of age. However, you may need to be screened more often if early forms of precancerous polyps or small growths are found. Skin Cancer  Check your skin from head to toe regularly.  Tell your health care provider about any new moles or changes in moles, especially if there is a change in a mole's shape or color.  Also tell your health care provider if you have a mole that is larger than the size of a pencil eraser.  Always use sunscreen. Apply sunscreen liberally and repeatedly throughout the day.  Protect yourself by wearing long sleeves, pants, a wide-brimmed hat, and sunglasses whenever you are outside. HEART DISEASE, DIABETES, AND HIGH BLOOD PRESSURE   High blood pressure causes heart disease and increases the risk of stroke. High blood pressure is more likely to develop in:  People who have blood pressure in the high end   of the normal range (130-139/85-89 mm Hg).  People who are overweight or obese.  People who are African American.  If you are 38-23 years of age, have your blood pressure checked every 3-5 years. If you are 61 years of age or older, have your blood pressure checked every year. You should have your blood pressure measured twice--once when you are at a hospital or clinic, and once when you are not at a hospital or clinic. Record the average of the two measurements. To check your blood pressure when you are not at a hospital or clinic, you can use:  An automated blood pressure machine at a pharmacy.  A home blood pressure monitor.  If you are between 45 years and 39 years old, ask your health care provider if you should take aspirin to prevent strokes.  Have regular diabetes screenings. This involves taking a blood sample to check your fasting blood sugar level.  If you are at a normal weight and have a low risk for diabetes,  have this test once every three years after 52 years of age.  If you are overweight and have a high risk for diabetes, consider being tested at a younger age or more often. PREVENTING INFECTION  Hepatitis B  If you have a higher risk for hepatitis B, you should be screened for this virus. You are considered at high risk for hepatitis B if:  You were born in a country where hepatitis B is common. Ask your health care provider which countries are considered high risk.  Your parents were born in a high-risk country, and you have not been immunized against hepatitis B (hepatitis B vaccine).  You have HIV or AIDS.  You use needles to inject street drugs.  You live with someone who has hepatitis B.  You have had sex with someone who has hepatitis B.  You get hemodialysis treatment.  You take certain medicines for conditions, including cancer, organ transplantation, and autoimmune conditions. Hepatitis C  Blood testing is recommended for:  Everyone born from [birth year] through 1967.  Anyone with known risk factors for hepatitis C. Sexually transmitted infections (STIs)  You should be screened for sexually transmitted infections (STIs) including gonorrhea and chlamydia if:  You are sexually active and are younger than 52 years of age.  You are older than 53 years of age and your health care provider tells you that you are at risk for this type of infection.  Your sexual activity has changed since you were last screened and you are at an increased risk for chlamydia or gonorrhea. Ask your health care provider if you are at risk.  If you do not have HIV, but are at risk, it may be recommended that you take a prescription medicine daily to prevent HIV infection. This is called pre-exposure prophylaxis (PrEP). You are considered at risk if:  You are sexually active and do not regularly use condoms or know the HIV status of your partner(s).  You take drugs by injection.  You are sexually  active with a partner who has HIV. Talk with your health care provider about whether you are at high risk of being infected with HIV. If you choose to begin PrEP, you should first be tested for HIV. You should then be tested every 3 months for as long as you are taking PrEP.  PREGNANCY   If you are premenopausal and you may become pregnant, ask your health care provider about preconception counseling.  If you may  become pregnant, take 400 to 800 micrograms (mcg) of folic acid every day.  If you want to prevent pregnancy, talk to your health care provider about birth control (contraception). OSTEOPOROSIS AND MENOPAUSE   Osteoporosis is a disease in which the bones lose minerals and strength with aging. This can result in serious bone fractures. Your risk for osteoporosis can be identified using a bone density scan.  If you are 61 years of age or older, or if you are at risk for osteoporosis and fractures, ask your health care provider if you should be screened.  Ask your health care provider whether you should take a calcium or vitamin D supplement to lower your risk for osteoporosis.  Menopause may have certain physical symptoms and risks.  Hormone replacement therapy may reduce some of these symptoms and risks. Talk to your health care provider about whether hormone replacement therapy is right for you.  HOME CARE INSTRUCTIONS   Schedule regular health, dental, and eye exams.  Stay current with your immunizations.   Do not use any tobacco products including cigarettes, chewing tobacco, or electronic cigarettes.  If you are pregnant, do not drink alcohol.  If you are breastfeeding, limit how much and how often you drink alcohol.  Limit alcohol intake to no more than 1 drink per day for nonpregnant women. One drink equals 12 ounces of beer, 5 ounces of wine, or 1 ounces of hard liquor.  Do not use street drugs.  Do not share needles.  Ask your health care provider for help if  you need support or information about quitting drugs.  Tell your health care provider if you often feel depressed.  Tell your health care provider if you have ever been abused or do not feel safe at home.   This information is not intended to replace advice given to you by your health care provider. Make sure you discuss any questions you have with your health care provider.   Document Released: 09/04/2010 Document Revised: 03/12/2014 Document Reviewed: 01/21/2013 Elsevier Interactive Patient Education [employer].

## 2015-09-28 NOTE — Assessment & Plan Note (Signed)
Continue follow-up with [facility] in November. Last mammogram was negative.

## 2015-09-28 NOTE — Progress Notes (Signed)
Subjective:    Patient ID: Natasha Dennis, female    DOB: June 10, 1965, 50 y.o.   MRN: [id number]  HPI: Natasha Dennis is a 50 y.o. female presenting on 09/28/2015 for Gynecologic Exam   HPI  Pt presents for physical today. Overall doing well.  Hysterectomy age 80- and bilateral oophremectomy 2013.  Increasing weight- since being placed on hormones blockers in her breast cancer. Just finished therapy first of the year. No current exercise regimen.  Stressors with parents health.  TDAP- within past 10 years.  Bone Density scan- 2014- mild osteopenia.   Past Medical History:  Diagnosis Date  . Breast cancer ([place]) 03-17-2009   lt Mastectomy  . Cancer ([place])   . GERD (gastroesophageal reflux disease)    Social History   Social History  . Marital status: Married    Spouse name: N/A  . Number of children: N/A  . Years of education: N/A   Occupational History  . Not on file.   Social History Main Topics  . Smoking status: Former Smoker    Quit date: 03/05/1984  . Smokeless tobacco: Never Used  . Alcohol use No  . Drug use: No  . Sexual activity: Not on file   Other Topics Concern  . Not on file   Social History Narrative  . No narrative on file   Family History  Problem Relation Age of Onset  . Diabetes Mother   . Arthritis Mother   . Heart disease Father   . Breast cancer Neg Hx    Current Outpatient Prescriptions on File Prior to Visit  Medication Sig  . cholecalciferol (VITAMIN D) 1000 UNITS tablet Take 1,000 Units by mouth daily.  Marland Kitchen omeprazole (PRILOSEC) 20 MG capsule Take 20 mg by mouth daily.   No current facility-administered medications on file prior to visit.     Review of Systems  Constitutional: Negative for chills and fever.  HENT: Negative.   Respiratory: Negative for cough, chest tightness and wheezing.   Cardiovascular: Negative for chest pain and leg swelling.  Gastrointestinal: Negative for abdominal pain, constipation, diarrhea, nausea  and vomiting.  Endocrine: Negative.  Negative for cold intolerance, heat intolerance, polydipsia, polyphagia and polyuria.  Genitourinary: Negative for difficulty urinating and dysuria.  Musculoskeletal: Negative.   Neurological: Negative for dizziness, light-headedness and numbness.  Psychiatric/Behavioral: Negative.    Per HPI unless specifically indicated above     Objective:    BP 113/73 (BP Location: Right Arm, Patient Position: Sitting, Cuff Size: Large)   Pulse 75   Temp 98.7 F (37.1 C) (Oral)   Resp 16   Ht 5\' 6"  (1.676 m)   Wt 215 lb 9.6 oz (97.8 kg)   BMI 34.80 kg/m   Wt Readings from Last 3 Encounters:  09/28/15 215 lb 9.6 oz (97.8 kg)  01/11/15 216 lb 0.8 oz (98 kg)  09/16/14 215 lb 9.6 oz (97.8 kg)    Physical Exam  Constitutional: She is oriented to person, place, and time. She appears well-developed and well-nourished.  HENT:  Head: Normocephalic and atraumatic.  Neck: Neck supple.  Cardiovascular: Normal rate, regular rhythm and normal heart sounds.  Exam reveals no gallop and no friction rub.   No murmur heard. Pulmonary/Chest: Effort normal and breath sounds normal. She has no wheezes. She exhibits no tenderness.  Abdominal: Soft. Normal appearance and bowel sounds are normal. She exhibits no distension and no mass. There is no tenderness. There is no rebound and no guarding.  Genitourinary:  Vagina normal. There is no tenderness on the right labia. There is no tenderness on the left labia. No erythema, tenderness or bleeding in the vagina. No vaginal discharge found.  Genitourinary Comments: Uterus, cervix, and ovaries surgically absent.   Musculoskeletal: Normal range of motion. She exhibits no edema or tenderness.  Lymphadenopathy:    She has no cervical adenopathy.  Neurological: She is alert and oriented to person, place, and time.  Skin: Skin is warm and dry.   Results for orders placed or performed during the hospital encounter of 01/14/15  Lipid  panel  Result Value Ref Range   Cholesterol 203 (H) 0 - 200 mg/dL   Triglycerides 60 <150 mg/dL   HDL 51 >40 mg/dL   Total CHOL/HDL Ratio 4.0 RATIO   VLDL 12 0 - 40 mg/dL   LDL Cholesterol 140 (H) 0 - 99 mg/dL      Assessment & Plan:   Problem List Items Addressed This Visit      Musculoskeletal and Integument   Osteopenia    Check vitamin D. Last DEXa was 2014. Plan on repeat in next few years. Reviewed weight bearing exercise and calcium/vitamin D requirements.       Relevant Orders   VITAMIN D 25 Hydroxy (Vit-D Deficiency, Fractures)   BASIC METABOLIC PANEL WITH GFR     Other   Breast cancer ([place])    Continue follow-up with [place] [facility] in November. Last mammogram was negative.       Relevant Orders   TSH    Other Visit Diagnoses    Abnormal weight gain    -  Primary   Check TSH. Likely 2/2 hormone medication for breast cancer.    Relevant Orders   B12   Well woman exam with routine gynecological exam       Reviewed health maintenance. Discussed tips for weight loss.    Relevant Orders   Pap,SurePath with HPV   Lipid Profile   Screening for cervical cancer       Will screen since patient has not has screening in several years. Discussed recommendations for cancer screening post hysterectomy.    Relevant Orders   Pap,SurePath with HPV      No orders of the defined types were placed in this encounter.     Follow up plan: No Follow-up on file.

## 2015-09-28 NOTE — Assessment & Plan Note (Signed)
Check vitamin D. Last DEXa was 2014. Plan on repeat in next few years. Reviewed weight bearing exercise and calcium/vitamin D requirements.

## 2015-09-29 LAB — BASIC METABOLIC PANEL WITH GFR
BUN: 12 mg/dL (ref 7–25)
CALCIUM: 9.2 mg/dL (ref 8.6–10.4)
CO2: 27 mmol/L (ref 20–31)
Chloride: 104 mmol/L (ref 98–110)
Creat: 0.77 mg/dL (ref 0.50–1.05)
Glucose, Bld: 86 mg/dL (ref 65–99)
Potassium: 4.2 mmol/L (ref 3.5–5.3)
SODIUM: 139 mmol/L (ref 135–146)

## 2015-09-29 LAB — LIPID PANEL
CHOLESTEROL: 181 mg/dL (ref 125–200)
HDL: 56 mg/dL (ref >=46)
LDL CALC: 99 mg/dL (ref <130)
TRIGLYCERIDES: 130 mg/dL (ref <150)
Total CHOL/HDL Ratio: 3.2 Ratio (ref <=5.0)
VLDL: 26 mg/dL (ref <30)

## 2015-09-29 LAB — TSH: TSH: 0.81 m[IU]/L

## 2015-09-29 LAB — VITAMIN D 25 HYDROXY (VIT D DEFICIENCY, FRACTURES): Vit D, 25-Hydroxy: 25 ng/mL — ABNORMAL LOW (ref 30–100)

## 2015-09-30 ENCOUNTER — Encounter: Payer: Self-pay | Admitting: Family Medicine

## 2015-09-30 ENCOUNTER — Other Ambulatory Visit: Payer: Self-pay | Admitting: Family Medicine

## 2015-09-30 DIAGNOSIS — E559 Vitamin D deficiency, unspecified: Secondary | ICD-10-CM

## 2015-09-30 LAB — PAP,SUREPATH WITH HPV: HPV DNA High Risk: NOT DETECTED

## 2015-09-30 LAB — VITAMIN B12: VITAMIN B 12: 669 pg/mL (ref 200–1100)

## 2015-09-30 MED ORDER — VITAMIN D (ERGOCALCIFEROL) 1.25 MG (50000 UNIT) PO CAPS: 50000.0000 [IU] | ORAL_CAPSULE | ORAL | 1 refills | Status: DC

## 2015-10-04 ENCOUNTER — Other Ambulatory Visit: Payer: Self-pay | Admitting: Family Medicine

## 2015-10-04 DIAGNOSIS — E669 Obesity, unspecified: Secondary | ICD-10-CM

## 2015-10-04 MED ORDER — NALTREXONE-BUPROPION HCL ER 8-90 MG PO TB12: ORAL_TABLET | ORAL | 2 refills | Status: DC

## 2015-10-05 ENCOUNTER — Other Ambulatory Visit: Payer: Self-pay | Admitting: Family Medicine

## 2015-10-05 DIAGNOSIS — E669 Obesity, unspecified: Secondary | ICD-10-CM

## 2015-10-05 MED ORDER — NALTREXONE-BUPROPION HCL ER 8-90 MG PO TB12: ORAL_TABLET | ORAL | 2 refills | Status: DC

## 2015-11-29 ENCOUNTER — Ambulatory Visit (INDEPENDENT_AMBULATORY_CARE_PROVIDER_SITE_OTHER): Payer: 59 | Admitting: Family Medicine

## 2015-11-29 ENCOUNTER — Encounter: Payer: Self-pay | Admitting: Family Medicine

## 2015-11-29 VITALS — BP 134/78 | HR 79 | Temp 98.0°F | Resp 16 | Ht 66.0 in | Wt 208.8 lb

## 2015-11-29 DIAGNOSIS — Z6833 Body mass index (BMI) 33.0-33.9, adult: Secondary | ICD-10-CM | POA: Diagnosis not present

## 2015-11-29 DIAGNOSIS — K219 Gastro-esophageal reflux disease without esophagitis: Secondary | ICD-10-CM | POA: Diagnosis not present

## 2015-11-29 MED ORDER — OMEPRAZOLE 20 MG PO CPDR: 20.0000 mg | DELAYED_RELEASE_CAPSULE | Freq: Every day | ORAL | 2 refills | Status: DC

## 2015-11-29 NOTE — Progress Notes (Signed)
Subjective:    Patient ID: Natasha Dennis, female    DOB: [birth year], 50 y.o.   MRN: [id number]  HPI: Natasha Dennis is a 50 y.o. female presenting on 11/29/2015 for Weight Check (need RX for omeprozole)   HPI  Pt presents for weight check with Contrave. Doing well. Loss of 7lbs. Starting to go to the gym- sick parent have prevented her from doing so. Having some constipation with contrave. Taking colace. Goal weight is 180lbs. Making diet changes- trying smaller portion sizes.  Having more acid reflux symptoms.Woke up in the middle of night gagging on acid.  Symptoms worse at night. Worse with certain foods. No hematemesis. No blood in stools. No dysphagia.     Past Medical History:  Diagnosis Date  . Breast cancer ([place]) 03-17-2009   lt Mastectomy  . Cancer ([place])   . GERD (gastroesophageal reflux disease)     Current Outpatient Prescriptions on File Prior to Visit  Medication Sig  . Naltrexone-Bupropion HCl ER (CONTRAVE) 8-90 MG TB12 Week 1: Take 1 tablet by mouth in the AM; Week 2: Take 1 tab AM and 1 tab PM; Week 3: 2 tab AM and 1 tab PM; Week 4: 2 AM and 2 PM  . Vitamin D, Ergocalciferol, (DRISDOL) 50000 units CAPS capsule Take 1 capsule (50,000 Units total) by mouth every 7 (seven) days.   No current facility-administered medications on file prior to visit.     Review of Systems  Constitutional: Negative for chills and fever.  HENT: Negative.   Respiratory: Negative for cough, chest tightness and wheezing.   Cardiovascular: Negative for chest pain and leg swelling.  Gastrointestinal: Negative for abdominal pain, constipation, diarrhea, nausea and vomiting.  Endocrine: Negative.  Negative for cold intolerance, heat intolerance, polydipsia, polyphagia and polyuria.  Genitourinary: Negative for difficulty urinating and dysuria.  Musculoskeletal: Negative.   Neurological: Negative for dizziness, light-headedness and numbness.  Psychiatric/Behavioral: Negative.    Per  HPI unless specifically indicated above     Objective:    BP 134/78   Pulse 79   Temp 98 F (36.7 C) (Oral)   Resp 16   Ht 5\' 6"  (1.676 m)   Wt 208 lb 12.8 oz (94.7 kg)   BMI 33.70 kg/m   Wt Readings from Last 3 Encounters:  11/29/15 208 lb 12.8 oz (94.7 kg)  09/28/15 215 lb 9.6 oz (97.8 kg)  01/11/15 216 lb 0.8 oz (98 kg)    Physical Exam  Constitutional: She is oriented to person, place, and time. She appears well-developed and well-nourished.  HENT:  Head: Normocephalic and atraumatic.  Neck: Neck supple.  Cardiovascular: Normal rate, regular rhythm and normal heart sounds.  Exam reveals no gallop and no friction rub.   No murmur heard. Pulmonary/Chest: Effort normal and breath sounds normal. She has no wheezes. She exhibits no tenderness.  Abdominal: Soft. Normal appearance and bowel sounds are normal. She exhibits no distension and no mass. There is no tenderness. There is no rebound and no guarding.  Musculoskeletal: Normal range of motion. She exhibits no edema or tenderness.  Lymphadenopathy:    She has no cervical adenopathy.  Neurological: She is alert and oriented to person, place, and time.  Skin: Skin is warm and dry.   Results for orders placed or performed in visit on 09/28/15  Pap,SurePath with HPV  Result Value Ref Range   HPV DNA High Risk Not Detected    Specimen adequacy:     FINAL DIAGNOSIS:  Cytotechnologist:        Assessment & Plan:   Problem List Items Addressed This Visit      Digestive   GERD (gastroesophageal reflux disease) - Primary    Restart omeprazole. Reviewed risks vs. Benefits of long term medication. Pt will try to wean off as she loses weight. Discussed diet triggers. Reviewed indications for EGD- if acid gagging does not improve with medication, refer to GI.       Relevant Medications   omeprazole (PRILOSEC) 20 MG capsule     Other   BMI 33.0-33.9,adult    Continue contrave for weight loss. Loss of 7lbs thus far. Goal  is 11lb loss by 12 weeks on medications. Encouraged diet and lifestyle changes. Pt plans to continue on medications for 3 mos. Discussed how to taper off.  Goal weight 180lbs.       Other Visit Diagnoses   None.     Meds ordered this encounter  Medications  . omeprazole (PRILOSEC) 20 MG capsule    Sig: Take 1 capsule (20 mg total) by mouth daily.    Dispense:  90 capsule    Refill:  2    Order Specific Question:   Supervising Provider    Answer:   Arlis Porta [id number]      Follow up plan: Return if symptoms worsen or fail to improve.

## 2015-11-29 NOTE — Patient Instructions (Signed)
Keep up the good work with weight loss. Goal is 11lbs weight loss by 12 weeks on the medication.  Try adding 10 minutes after each meal walking to help with weight loss.   Please try to meet the goal of 150 minutes of exercise per week.  This is generally 30-40 minutes of moderate activity 3-4 times per week. Consider a calorie goal of 1800 per day. Eat mainly fruits, vegetables, and lean proteins (chicken or fish).  A great resource for healthy living and weight loss is Choosemyplate.gov

## 2015-11-29 NOTE — Assessment & Plan Note (Signed)
Restart omeprazole. Reviewed risks vs. Benefits of long term medication. Pt will try to wean off as she loses weight. Discussed diet triggers. Reviewed indications for EGD- if acid gagging does not improve with medication, refer to GI.

## 2015-11-29 NOTE — Assessment & Plan Note (Signed)
Continue contrave for weight loss. Loss of 7lbs thus far. Goal is 11lb loss by 12 weeks on medications. Encouraged diet and lifestyle changes. Pt plans to continue on medications for 3 mos. Discussed how to taper off.  Goal weight 180lbs.

## 2015-11-30 ENCOUNTER — Other Ambulatory Visit: Payer: Self-pay | Admitting: Family Medicine

## 2015-11-30 DIAGNOSIS — Z87898 Personal history of other specified conditions: Secondary | ICD-10-CM

## 2015-11-30 MED ORDER — SCOPOLAMINE 1 MG/3DAYS TD PT72: 1.0000 | MEDICATED_PATCH | TRANSDERMAL | 12 refills | Status: DC

## 2016-01-11 ENCOUNTER — Inpatient Hospital Stay (HOSPITAL_BASED_OUTPATIENT_CLINIC_OR_DEPARTMENT_OTHER): Payer: 59 | Admitting: Internal Medicine

## 2016-01-11 ENCOUNTER — Inpatient Hospital Stay: Payer: 59 | Attending: Internal Medicine

## 2016-01-11 VITALS — BP 119/81 | HR 76 | Temp 97.1°F | Resp 18 | Wt 209.2 lb

## 2016-01-11 DIAGNOSIS — Z9012 Acquired absence of left breast and nipple: Secondary | ICD-10-CM | POA: Insufficient documentation

## 2016-01-11 DIAGNOSIS — Z853 Personal history of malignant neoplasm of breast: Secondary | ICD-10-CM | POA: Insufficient documentation

## 2016-01-11 DIAGNOSIS — Z9221 Personal history of antineoplastic chemotherapy: Secondary | ICD-10-CM | POA: Insufficient documentation

## 2016-01-11 DIAGNOSIS — Z803 Family history of malignant neoplasm of breast: Secondary | ICD-10-CM

## 2016-01-11 DIAGNOSIS — C50011 Malignant neoplasm of nipple and areola, right female breast: Secondary | ICD-10-CM

## 2016-01-11 DIAGNOSIS — K219 Gastro-esophageal reflux disease without esophagitis: Secondary | ICD-10-CM | POA: Diagnosis not present

## 2016-01-11 DIAGNOSIS — C50919 Malignant neoplasm of unspecified site of unspecified female breast: Secondary | ICD-10-CM

## 2016-01-11 LAB — COMPREHENSIVE METABOLIC PANEL
ALBUMIN: 4.7 g/dL (ref 3.5–5.0)
ALT: 18 U/L (ref 14–54)
ANION GAP: 8 (ref 5–15)
AST: 21 U/L (ref 15–41)
Alkaline Phosphatase: 92 U/L (ref 38–126)
BILIRUBIN TOTAL: 0.7 mg/dL (ref 0.3–1.2)
BUN: 16 mg/dL (ref 6–20)
CHLORIDE: 99 mmol/L — AB (ref 101–111)
CO2: 27 mmol/L (ref 22–32)
Calcium: 9.2 mg/dL (ref 8.9–10.3)
Creatinine, Ser: 0.78 mg/dL (ref 0.44–1.00)
GFR calc Af Amer: >60 mL/min (ref >60)
GLUCOSE: 99 mg/dL (ref 65–99)
POTASSIUM: 4.1 mmol/L (ref 3.5–5.1)
Sodium: 134 mmol/L — ABNORMAL LOW (ref 135–145)
TOTAL PROTEIN: 7.8 g/dL (ref 6.5–8.1)

## 2016-01-11 LAB — CBC WITH DIFFERENTIAL/PLATELET
BASOS ABS: 0 10*3/uL (ref 0–0.1)
BASOS PCT: 0 %
Eosinophils Absolute: 0.1 10*3/uL (ref 0–0.7)
Eosinophils Relative: 1 %
HEMATOCRIT: 42.1 % (ref 35.0–47.0)
HEMOGLOBIN: 14.5 g/dL (ref 12.0–16.0)
LYMPHS PCT: 25 %
Lymphs Abs: 2.1 10*3/uL (ref 1.0–3.6)
MCH: 28.1 pg (ref 26.0–34.0)
MCHC: 34.3 g/dL (ref 32.0–36.0)
MCV: 81.8 fL (ref 80.0–100.0)
MONO ABS: 0.5 10*3/uL (ref 0.2–0.9)
Monocytes Relative: 6 %
NEUTROS ABS: 5.7 10*3/uL (ref 1.4–6.5)
NEUTROS PCT: 68 %
Platelets: 207 10*3/uL (ref 150–440)
RBC: 5.15 MIL/uL (ref 3.80–5.20)
RDW: 13.9 % (ref 11.5–14.5)
WBC: 8.5 10*3/uL (ref 3.6–11.0)

## 2016-01-11 NOTE — Progress Notes (Signed)
Patient does not offer any problems today.   bila mamo and [place] in 09/2016

## 2016-01-12 ENCOUNTER — Encounter: Payer: Self-pay | Admitting: Internal Medicine

## 2016-01-13 ENCOUNTER — Encounter: Payer: Self-pay | Admitting: Internal Medicine

## 2016-01-20 ENCOUNTER — Telehealth: Payer: 59 | Admitting: Family

## 2016-01-20 DIAGNOSIS — N390 Urinary tract infection, site not specified: Secondary | ICD-10-CM

## 2016-01-20 MED ORDER — SULFAMETHOXAZOLE-TRIMETHOPRIM 800-160 MG PO TABS: 1.0000 | ORAL_TABLET | Freq: Two times a day (BID) | ORAL | 0 refills | Status: DC

## 2016-01-20 NOTE — Progress Notes (Signed)

## 2016-01-21 ENCOUNTER — Ambulatory Visit (HOSPITAL_COMMUNITY)
Admission: RE | Admit: 2016-01-21 | Discharge: 2016-01-21 | Disposition: A | Discharge status: Home / Self Care | Payer: 59 | Source: Ambulatory Visit | Attending: Internal Medicine | Admitting: Internal Medicine

## 2016-01-21 DIAGNOSIS — Z9882 Breast implant status: Secondary | ICD-10-CM | POA: Insufficient documentation

## 2016-01-21 DIAGNOSIS — N6489 Other specified disorders of breast: Secondary | ICD-10-CM | POA: Diagnosis not present

## 2016-01-21 DIAGNOSIS — C50012 Malignant neoplasm of nipple and areola, left female breast: Secondary | ICD-10-CM | POA: Diagnosis not present

## 2016-01-21 DIAGNOSIS — C50011 Malignant neoplasm of nipple and areola, right female breast: Secondary | ICD-10-CM

## 2016-01-21 MED ORDER — GADOBENATE DIMEGLUMINE 529 MG/ML IV SOLN
19.0000 mL | Freq: Once | INTRAVENOUS | Status: AC | PRN
  Administered 2016-01-21: 19 mL via INTRAVENOUS

## 2016-01-29 NOTE — Progress Notes (Signed)
[place]  [phone] Fax:(336) [phone]  ID: Natasha Dennis OB: [birth year]  MR#: [id number]  [id number]  Patient Care Team: Luciana Axe, NP as PCP - General (Family Medicine)  CHIEF COMPLAINT: Breast Cancer   HPI: Invasive lobular carcinoma, T2 N1 M0, treated  on NASBP protocol.with chemotherapy on tamoxifen (switch from Femara) in July of 2012 now on Femara 2.5 mg by mouth dailyfrom October of 2012 discontinued Femara because of bony pains July of 2013 Started on Aromasin from July of 2013- stopped in 2016 due to side effects Patient has stopped taking Aromasin because of perceived side effect   She returns today with no new compiants, no new breast lumps, no new bone pains, no axilllary or neck adenopathy.  Results review ( External and Internal):  Mammogram B/L from 09/2015 ha sbeen reviewed, shows benign findings, but has ACR breast density category c limiting sensitivity.   LAB RESULTS:  Lab Results  Component Value Date   NA 134 (L) 01/11/2016   K 4.1 01/11/2016   CL 99 (L) 01/11/2016   CO2 27 01/11/2016   GLUCOSE 99 01/11/2016   BUN 16 01/11/2016   CREATININE 0.78 01/11/2016   CALCIUM 9.2 01/11/2016   PROT 7.8 01/11/2016   ALBUMIN 4.7 01/11/2016   AST 21 01/11/2016   ALT 18 01/11/2016   ALKPHOS 92 01/11/2016   BILITOT 0.7 01/11/2016   GFRNONAA >60 01/11/2016   GFRAA >60 01/11/2016    Lab Results  Component Value Date   WBC 8.5 01/11/2016   NEUTROABS 5.7 01/11/2016   HGB 14.5 01/11/2016   HCT 42.1 01/11/2016   MCV 81.8 01/11/2016   PLT 207 01/11/2016     STUDIES: Mr Breast Bilateral W Wo Contrast  Result Date: 01/23/2016 CLINICAL DATA:  Screening breast MRI. History of left mastectomy in 2011 for breast cancer. Status post TRAM flap reconstruction on the left. LABS:  None obtained EXAM: BILATERAL BREAST MRI WITH AND WITHOUT CONTRAST TECHNIQUE: Multiplanar, multisequence MR images of both breasts  were obtained prior to and following the intravenous administration of 19 ml of MultiHance. THREE-DIMENSIONAL MR IMAGE RENDERING ON INDEPENDENT WORKSTATION: Three-dimensional MR images were rendered by post-processing of the original MR data on an independent workstation. The three-dimensional MR images were interpreted, and findings are reported in the following complete MRI report for this study. Three dimensional images were evaluated at the independent DynaCad workstation COMPARISON:  Screening mammogram of the right breast 09/26/2015. Breast MRI 10/16/2011 FINDINGS: Breast composition: b. Scattered fibroglandular tissue. Background parenchymal enhancement: Mild Right breast: No mass or abnormal enhancement. A subpectoral breast implant is present. Left breast: Status post mastectomy with TRAM flap reconstruction. No mass or abnormal enhancement. Lymph nodes: No abnormal appearing lymph nodes. Ancillary findings:  None. IMPRESSION: No MRI evidence of malignancy in the right breast or reconstructed left breast (TRAM flap). RECOMMENDATION: Screening mammogram is recommended in July 2018. BI-RADS CATEGORY  1: Negative. Electronically Signed   By: Curlene Dolphin M.D.   On: 01/23/2016 10:39       REVIEW OF SYSTEMS:   Review of Systems  Cardiovascular: Negative.   Gastrointestinal: Negative.   Musculoskeletal: Negative.   Skin: Negative.   Neurological: Negative.   Endo/Heme/Allergies: Negative.   Psychiatric/Behavioral: Negative.   All other systems reviewed and are negative.   As per HPI. Otherwise, a complete review of systems is negative.  PAST MEDICAL HISTORY: Past Medical History:  Diagnosis Date  . Breast cancer ([place]) 03-17-2009   lt  Mastectomy  . Cancer ([place])   . GERD (gastroesophageal reflux disease)     PAST SURGICAL HISTORY: Past Surgical History:  Procedure Laterality Date  . ABDOMINAL HYSTERECTOMY    . AUGMENTATION MAMMAPLASTY Right    RT BREAST IMPLANT ONLY  . MASTECTOMY  Left 2011    FAMILY HISTORY: Family History  Problem Relation Age of Onset  . Diabetes Mother   . Arthritis Mother   . Heart disease Father   . Breast cancer Neg Hx     No Known Allergies  Vitals:   01/11/16 1014  BP: 119/81  Pulse: 76  Resp: 18  Temp: 97.1 F (36.2 C)     Body mass index is 33.77 kg/m.   2.1 meters squared    Physical Exam  Constitutional: She is oriented to person, place, and time. She appears well-developed and well-nourished.  HENT:  Head: Normocephalic and atraumatic.  Eyes: EOM are normal. Pupils are equal, round, and reactive to light.  Neck: Normal range of motion. Neck supple.  Cardiovascular: Normal rate.   Pulmonary/Chest: Effort normal.  Musculoskeletal: Normal range of motion. She exhibits no edema or deformity.  Neurological: She is oriented to person, place, and time.  Skin: Skin is warm. No pallor.  Psychiatric: She has a normal mood and affect. Her behavior is normal.    Breast exam::  Right breast shows no palpable lumps, an implant is noted in place Well healed TRAM reconstruction noted  in the left breast Right axilla is free of any palpable lumps Left axillla is free of any palpable lumps  No adenopathy was felt in the supraclavicular or cervical regions  Upper extremities are without edema.       Impression and plan:  Left breast invasive lobular cancer, with no evidence of recurrence I have reviwed the mamogarm with the radiologist. The density of her breast remains high to lower the sensitivity of a mammogram. Also, given that her cancer was lobular and the fact that her oriiginal cancer was nto seen on a mammogram, I have recommended that we continue with MRI breast yearly along with mammogram.  Even though she had a left mastectomy, given that she has a TRAM reconstucttion , I would recommend a bilateral mammogram and a b/l MRI alterantign every 6 months.   She understands and agrees to the plan; She knows that  this approach has a high chance of false positives that my lead to unnecessary biopsies, but she is willing to take that chance. In the future, as her breast density declines, the sensitiviy of mamogram will impove at which point, MRI may not be necessary.. As  Orders Placed This Encounter  Procedures  . MR BREAST BILATERAL W WO CONTRAST    Return in about 1 month (around 02/10/2016).   Patient expressed understanding and was in agreement with this plan. She also understands that She can call clinic at any time with any questions, concerns, or complaints.       This note was generated in part with voice recognition software and I apologize for any typographical errors that were not detected and corrected.    Creola Corn, MD   01/29/2016 1:28 PM

## 2016-02-01 ENCOUNTER — Other Ambulatory Visit: Payer: Self-pay | Admitting: Hematology and Oncology

## 2016-02-01 DIAGNOSIS — Z853 Personal history of malignant neoplasm of breast: Secondary | ICD-10-CM | POA: Insufficient documentation

## 2016-02-15 ENCOUNTER — Encounter: Payer: Self-pay | Admitting: Hematology and Oncology

## 2016-02-15 ENCOUNTER — Inpatient Hospital Stay: Payer: 59 | Attending: Hematology and Oncology | Admitting: Hematology and Oncology

## 2016-02-15 DIAGNOSIS — Z87891 Personal history of nicotine dependence: Secondary | ICD-10-CM | POA: Insufficient documentation

## 2016-02-15 DIAGNOSIS — Z9221 Personal history of antineoplastic chemotherapy: Secondary | ICD-10-CM | POA: Insufficient documentation

## 2016-02-15 DIAGNOSIS — Z853 Personal history of malignant neoplasm of breast: Secondary | ICD-10-CM | POA: Diagnosis not present

## 2016-02-15 DIAGNOSIS — Z9012 Acquired absence of left breast and nipple: Secondary | ICD-10-CM | POA: Diagnosis not present

## 2016-02-15 DIAGNOSIS — C50012 Malignant neoplasm of nipple and areola, left female breast: Secondary | ICD-10-CM

## 2016-02-15 DIAGNOSIS — Z1231 Encounter for screening mammogram for malignant neoplasm of breast: Secondary | ICD-10-CM | POA: Insufficient documentation

## 2016-02-15 DIAGNOSIS — K219 Gastro-esophageal reflux disease without esophagitis: Secondary | ICD-10-CM | POA: Insufficient documentation

## 2016-02-15 NOTE — Assessment & Plan Note (Signed)
Examination did not reveal any signs of cancer recurrence. I stressed the importance of frequent self breast examination We discussed future follow-up. She will need to continue on annual screening mammogram and MRI every 2-3 years as additional screening modality. The patient is comfortable with follow-up with primary care doctor only. She will call us if she needs future follow-up.

## 2016-02-15 NOTE — Progress Notes (Signed)
[place] progress notes  Patient Care Team: Amy Overton Mam, NP as PCP - General (Family Medicine)  CHIEF COMPLAINTS/PURPOSE OF VISIT:  History of left breast cancer status post neoadjuvant chemotherapy, mastectomy and anti-estrogen treatment  HISTORY OF PRESENTING ILLNESS:  Natasha Dennis 50 y.o. female was transferred to my care after her prior physician has left.  I reviewed the patient's records extensive and collaborated the history with the patient. Summary of her history is as follows:   Cancer of left breast ([place])   07/05/2008 Initial Diagnosis    History is obtained through chart review from Dr. Metro Kung notes. She was originally diagnosed with breast cancer, invasive lobular carcinoma in May 2010. She was placed on the protocol and received neoadjuvant chemotherapy. She had left mastectomy with reconstruction surgery and right breast augmentation with implant. Subsequently she was placed on tamoxifen until July of 2012, then Femara 2.5 mg.  Femara was stopped because of bony pains July of 2013. She was then switched to Aromasin. Patient has stopped taking Aromasin because of perceived side effect. In total, she had received anti-estrogen therapy for almost 4 years      10/17/2010 Surgery    She had bilateral salpingo-oophorectomy      01/21/2016 Imaging    No MRI evidence of malignancy in the right breast or reconstructed left breast (TRAM flap).      She is doing well. She had very minimum trace neuropathy from prior treatment.She denies any recent abnormal breast examination, palpable mass, abnormal breast appearance or nipple changes The patient had previously tested negative for BRCA mutation  MEDICAL HISTORY:  Past Medical History:  Diagnosis Date  . Breast cancer ([place]) 03-17-2009   lt Mastectomy  . Cancer ([place])   . GERD (gastroesophageal reflux disease)     SURGICAL HISTORY: Past Surgical History:  Procedure Laterality Date  .  ABDOMINAL HYSTERECTOMY    . AUGMENTATION MAMMAPLASTY Right    RT BREAST IMPLANT ONLY  . MASTECTOMY Left 2011    SOCIAL HISTORY: Social History   Social History  . Marital status: Married    Spouse name: N/A  . Number of children: N/A  . Years of education: N/A   Occupational History  . Not on file.   Social History Main Topics  . Smoking status: Former Smoker    Quit date: 03/05/1984  . Smokeless tobacco: Never Used  . Alcohol use No  . Drug use: No  . Sexual activity: Not on file   Other Topics Concern  . Not on file   Social History Narrative  . No narrative on file    FAMILY HISTORY: Family History  Problem Relation Age of Onset  . Diabetes Mother   . Arthritis Mother   . Heart disease Father   . Breast cancer Neg Hx     ALLERGIES:  has No Known Allergies.  MEDICATIONS:  Current Outpatient Prescriptions  Medication Sig Dispense Refill  . omeprazole (PRILOSEC) 20 MG capsule Take 1 capsule (20 mg total) by mouth daily. 90 capsule 2  . Vitamin D, Ergocalciferol, (DRISDOL) 50000 units CAPS capsule Take 1 capsule (50,000 Units total) by mouth every 7 (seven) days. 12 capsule 1  . Naltrexone-Bupropion HCl ER (CONTRAVE) 8-90 MG TB12 Week 1: Take 1 tablet by mouth in the AM; Week 2: Take 1 tab AM and 1 tab PM; Week 3: 2 tab AM and 1 tab PM; Week 4: 2 AM and 2 PM (Patient not taking: Reported on  02/15/2016) 120 tablet 2   No current facility-administered medications for this visit.     REVIEW OF SYSTEMS:   Constitutional: Denies fevers, chills or abnormal night sweats Eyes: Denies blurriness of vision, double vision or watery eyes Ears, nose, mouth, throat, and face: Denies mucositis or sore throat Respiratory: Denies cough, dyspnea or wheezes Cardiovascular: Denies palpitation, chest discomfort or lower extremity swelling Gastrointestinal:  Denies nausea, heartburn or change in bowel habits Skin: Denies abnormal skin rashes Lymphatics: Denies new lymphadenopathy  or easy bruising Neurological:Denies numbness, tingling or new weaknesses Behavioral/Psych: Mood is stable, no new changes  All other systems were reviewed with the patient and are negative.  PHYSICAL EXAMINATION: ECOG PERFORMANCE STATUS: 0 - Asymptomatic  Vitals:   02/15/16 1116  BP: 120/88  Pulse: 81  Resp: 18  Temp: 98.5 F (36.9 C)   Filed Weights   02/15/16 1116  Weight: 209 lb 14.1 oz (95.2 kg)    GENERAL:alert, no distress and comfortable SKIN: skin color, texture, turgor are normal, no rashes or significant lesions EYES: normal, conjunctiva are pink and non-injected, sclera clear PSYCH: alert & oriented x 3 with fluent speech NEURO: no focal motor/sensory deficits Bilateral breast examination is performed. Well-healed surgical scar on both breast. No palpable abnormalities  LABORATORY DATA:  I have reviewed the data as listed Lab Results  Component Value Date   WBC 8.5 01/11/2016   HGB 14.5 01/11/2016   HCT 42.1 01/11/2016   MCV 81.8 01/11/2016   PLT 207 01/11/2016    Recent Labs  09/28/15 1603 01/11/16 0955  NA 139 134*  K 4.2 4.1  CL 104 99*  CO2 27 27  GLUCOSE 86 99  BUN 12 16  CREATININE 0.77 0.78  CALCIUM 9.2 9.2  GFRNONAA >89 >60  GFRAA >89 >60  PROT  --  7.8  ALBUMIN  --  4.7  AST  --  21  ALT  --  18  ALKPHOS  --  92  BILITOT  --  0.7    RADIOGRAPHIC STUDIES: I have personally reviewed the radiological images as listed and agreed with the findings in the report. Mr Breast Bilateral W Wo Contrast  Result Date: 01/23/2016 CLINICAL DATA:  Screening breast MRI. History of left mastectomy in 2011 for breast cancer. Status post TRAM flap reconstruction on the left. LABS:  None obtained EXAM: BILATERAL BREAST MRI WITH AND WITHOUT CONTRAST TECHNIQUE: Multiplanar, multisequence MR images of both breasts were obtained prior to and following the intravenous administration of 19 ml of MultiHance. THREE-DIMENSIONAL MR IMAGE RENDERING ON INDEPENDENT  WORKSTATION: Three-dimensional MR images were rendered by post-processing of the original MR data on an independent workstation. The three-dimensional MR images were interpreted, and findings are reported in the following complete MRI report for this study. Three dimensional images were evaluated at the independent DynaCad workstation COMPARISON:  Screening mammogram of the right breast 09/26/2015. Breast MRI 10/16/2011 FINDINGS: Breast composition: b. Scattered fibroglandular tissue. Background parenchymal enhancement: Mild Right breast: No mass or abnormal enhancement. A subpectoral breast implant is present. Left breast: Status post mastectomy with TRAM flap reconstruction. No mass or abnormal enhancement. Lymph nodes: No abnormal appearing lymph nodes. Ancillary findings:  None. IMPRESSION: No MRI evidence of malignancy in the right breast or reconstructed left breast (TRAM flap). RECOMMENDATION: Screening mammogram is recommended in July 2018. BI-RADS CATEGORY  1: Negative. Electronically Signed   By: Curlene Dolphin M.D.   On: 01/23/2016 10:39    ASSESSMENT & PLAN:  Cancer of  left breast ([place]) Examination did not reveal any signs of cancer recurrence. I stressed the importance of frequent self breast examination We discussed future follow-up. She will need to continue on annual screening mammogram and MRI every 2-3 years as additional screening modality. The patient is comfortable with follow-up with primary care doctor only. She will call us if she needs future follow-up.    No orders of the defined types were placed in this encounter.   All questions were answered. The patient knows to call the clinic with any problems, questions or concerns. I spent 15 minutes counseling the patient face to face. The total time spent in the appointment was 20 minutes and more than 50% was on counseling.     Heath Lark, MD 02/15/2016 11:51 AM

## 2016-09-24 ENCOUNTER — Telehealth: Payer: Self-pay | Admitting: Obstetrics and Gynecology

## 2016-09-24 NOTE — Telephone Encounter (Signed)
Patient lvm to speak with "Joyice Faster". I did not see any record of the patient being seen here, And I also tried to reach out twice to get more information as to why she needs to speak with C.M. Please advise.

## 2016-09-24 NOTE — Telephone Encounter (Signed)
Pt states she wanted a appt for her daughter. She has scheduled one for tomorrow at 8:30.

## 2017-02-08 ENCOUNTER — Encounter: Payer: Self-pay | Admitting: Nurse Practitioner

## 2017-02-08 ENCOUNTER — Ambulatory Visit (INDEPENDENT_AMBULATORY_CARE_PROVIDER_SITE_OTHER): Payer: 59 | Admitting: Nurse Practitioner

## 2017-02-08 ENCOUNTER — Other Ambulatory Visit: Payer: Self-pay

## 2017-02-08 VITALS — BP 122/69 | HR 86 | Temp 98.0°F | Ht 64.0 in | Wt 210.2 lb

## 2017-02-08 DIAGNOSIS — Z6833 Body mass index (BMI) 33.0-33.9, adult: Secondary | ICD-10-CM | POA: Diagnosis not present

## 2017-02-08 DIAGNOSIS — Z13820 Encounter for screening for osteoporosis: Secondary | ICD-10-CM | POA: Diagnosis not present

## 2017-02-08 DIAGNOSIS — E6609 Other obesity due to excess calories: Secondary | ICD-10-CM

## 2017-02-08 DIAGNOSIS — Z1231 Encounter for screening mammogram for malignant neoplasm of breast: Secondary | ICD-10-CM

## 2017-02-08 DIAGNOSIS — Z1239 Encounter for other screening for malignant neoplasm of breast: Secondary | ICD-10-CM

## 2017-02-08 DIAGNOSIS — Z0000 Encounter for general adult medical examination without abnormal findings: Secondary | ICD-10-CM

## 2017-02-08 NOTE — Patient Instructions (Addendum)
Natasha Dennis, Thank you for coming in to clinic today.  1. START taking one calcium w/ vitamin D tablet daily if you are going to continue your omeprazole.   2. Your mammogram and bone density scan orders have been placed.  Call the Scheduling phone number at [phone] to schedule your mammogram at your convenience.  You can choose to go to either location listed below.  Let the scheduler know which location you prefer.  [facility] Out[facility] Radiology [address] [place], [place] [address] [place],  [address]   3. Get your eyes examined and your teeth cleaned sometime in Jan or Feb.   4. Call about your Shingles vaccine.   Please schedule a follow-up appointment with Cassell Smiles, AGNP. Return in about 1 year (around 02/08/2018) for annual physical.  If you have any other questions or concerns, please feel free to call the clinic or send a message through [place]. You may also schedule an earlier appointment if necessary.  You will receive a survey after today's visit either digitally by e-mail or paper by [employer]. Your experiences and feedback matter to [place].  Please respond so we know how we are doing as we provide care for you.  Cassell Smiles, DNP, AGNP-BC Adult Gerontology Nurse Practitioner [place]

## 2017-02-08 NOTE — Progress Notes (Signed)
Subjective:    Patient ID: Natasha Dennis, female    DOB: 05/13/1965, 51 y.o.   MRN: [id number]  Natasha Dennis is a 51 y.o. female presenting on 02/08/2017 for Annual Exam   HPI Annual Physical Exam  Patient has been feeling well.  They have acute concerns today w/ lower leg swelling. Sleeps 6-7 hours per night frequently interrupted.  Arms keep going to sleep at night(possible 2/2 chemo).  HEALTH MAINTENANCE: Weight/BMI: overweight, stable Physical activity: not recently Diet: eat out 2x per week, Fried foods only 1-2 x per week, steamable for lunch - using shakes for breakfast Seatbelt: always Sunscreen: yes PAP: has had hysterectomy at age 78- precancerous cells.  No abnormal since hysterectomy Mammogram: due now (Q1 yr s/p breast cancer left breast) DEXA: ovaries removed 4 years ago. Colon Cancer Screen: negative in past HIV: negative in past, no high risk behaviors Optometry: last exam about 2 years ago Dentistry: every 6 months  VACCINES: Tetanus: 2017 Influenza: exempt - allergy Shingles: due    Past Medical History:  Diagnosis Date  . Breast cancer ([place]) 03-17-2009   lt Mastectomy  . Cancer ([place])   . GERD (gastroesophageal reflux disease)    Past Surgical History:  Procedure Laterality Date  . ABDOMINAL HYSTERECTOMY    . AUGMENTATION MAMMAPLASTY Right    RT BREAST IMPLANT ONLY  . MASTECTOMY Left 2011   Social History   Socioeconomic History  . Marital status: Married    Spouse name: Not on file  . Number of children: Not on file  . Years of education: Not on file  . Highest education level: Not on file  Social Needs  . Financial resource strain: Not on file  . Food insecurity - worry: Not on file  . Food insecurity - inability: Not on file  . Transportation needs - medical: Not on file  . Transportation needs - non-medical: Not on file  Occupational History  . Not on file  Tobacco Use  . Smoking status: Former Smoker    Last attempt to  quit: 03/05/1984    Years since quitting: 32.9  . Smokeless tobacco: Never Used  Substance and Sexual Activity  . Alcohol use: No  . Drug use: No  . Sexual activity: Not on file  Other Topics Concern  . Not on file  Social History Narrative  . Not on file   Family History  Problem Relation Age of Onset  . Diabetes Mother   . Arthritis Mother   . Heart disease Father   . Breast cancer Neg Hx    Current Outpatient Medications on File Prior to Visit  Medication Sig  . cholecalciferol (VITAMIN D) 1000 units tablet Take 1,000 Units by mouth daily.  Marland Kitchen omeprazole (PRILOSEC) 20 MG capsule Take 1 capsule (20 mg total) by mouth daily.  . Vitamin D, Ergocalciferol, (DRISDOL) 50000 units CAPS capsule Take 1 capsule (50,000 Units total) by mouth every 7 (seven) days.  . Naltrexone-Bupropion HCl ER (CONTRAVE) 8-90 MG TB12 Week 1: Take 1 tablet by mouth in the AM; Week 2: Take 1 tab AM and 1 tab PM; Week 3: 2 tab AM and 1 tab PM; Week 4: 2 AM and 2 PM (Patient not taking: Reported on 02/15/2016)   No current facility-administered medications on file prior to visit.     Review of Systems  Constitutional: Negative.   HENT: Negative.   Respiratory: Negative for cough and shortness of breath.   Cardiovascular: Negative.  Gastrointestinal: Negative for constipation, diarrhea, nausea and vomiting.  Musculoskeletal: Positive for arthralgias. Negative for back pain.  Skin: Negative.   Neurological: Negative.   Psychiatric/Behavioral: Negative.    Per HPI unless specifically indicated above     Objective:    BP 122/69 (BP Location: Right Arm, Patient Position: Sitting, Cuff Size: Normal)   Pulse 86   Temp 98 F (36.7 C) (Oral)   Ht 5\' 4"  (1.626 m)   Wt 210 lb 3.2 oz (95.3 kg)   BMI 36.08 kg/m   Wt Readings from Last 3 Encounters:  02/08/17 210 lb 3.2 oz (95.3 kg)  02/15/16 209 lb 14.1 oz (95.2 kg)  01/11/16 209 lb 3.5 oz (94.9 kg)    Physical Exam  General - obese, well-appearing,  NAD HEENT - Normocephalic, atraumatic, PERRL, EOMI, patent nares w/o congestion, oropharynx clear, MMM Neck - supple, non-tender, no LAD, no thyromegaly, no carotid bruit Heart - RRR, no murmurs heard Breast - Normal exam w/ symmetric breasts, no mass, no nipple discharge, no skin changes or tenderness.  Lungs - Clear throughout all lobes, no wheezing, crackles, or rhonchi. Normal work of breathing. Abdomen - soft, NTND, no masses, no hepatosplenomegaly, active bowel sounds GU - deferred by pt - no PAP r/t hysterectomy and negative PAP smears since surgery Extremeties - non-tender, no edema, cap refill < 2 seconds, peripheral pulses intact +2 bilaterally Skin - warm, dry, no rashes Neuro - awake, alert, oriented x3, CN II-X intact, intact muscle strength 5/5 bilaterally, intact distal sensation to light touch, normal coordination, normal gait Psych - Normal mood and affect, normal behavior   Results for orders placed or performed in visit on 01/11/16  CBC with Differential  Result Value Ref Range   WBC 8.5 3.6 - 11.0 K/uL   RBC 5.15 3.80 - 5.20 MIL/uL   Hemoglobin 14.5 12.0 - 16.0 g/dL   HCT 42.1 35.0 - 47.0 %   MCV 81.8 80.0 - 100.0 fL   MCH 28.1 26.0 - 34.0 pg   MCHC 34.3 32.0 - 36.0 g/dL   RDW 13.9 11.5 - 14.5 %   Platelets 207 150 - 440 K/uL   Neutrophils Relative % 68 %   Neutro Abs 5.7 1.4 - 6.5 K/uL   Lymphocytes Relative 25 %   Lymphs Abs 2.1 1.0 - 3.6 K/uL   Monocytes Relative 6 %   Monocytes Absolute 0.5 0.2 - 0.9 K/uL   Eosinophils Relative 1 %   Eosinophils Absolute 0.1 0 - 0.7 K/uL   Basophils Relative 0 %   Basophils Absolute 0.0 0 - 0.1 K/uL  Comprehensive metabolic panel  Result Value Ref Range   Sodium 134 (L) 135 - 145 mmol/L   Potassium 4.1 3.5 - 5.1 mmol/L   Chloride 99 (L) 101 - 111 mmol/L   CO2 27 22 - 32 mmol/L   Glucose, Bld 99 65 - 99 mg/dL   BUN 16 6 - 20 mg/dL   Creatinine, Ser 0.78 0.44 - 1.00 mg/dL   Calcium 9.2 8.9 - 10.3 mg/dL   Total  Protein 7.8 6.5 - 8.1 g/dL   Albumin 4.7 3.5 - 5.0 g/dL   AST 21 15 - 41 U/L   ALT 18 14 - 54 U/L   Alkaline Phosphatase 92 38 - 126 U/L   Total Bilirubin 0.7 0.3 - 1.2 mg/dL   GFR calc non Af Amer >60 >60 mL/min   GFR calc Af Amer >60 >60 mL/min   Anion gap 8 5 -  15      Assessment & Plan:   Problem List Items Addressed This Visit    None    Visit Diagnoses    Encounter for annual physical exam    -  Primary Physical exam with no new findings.  Well adult with no acute concerns.  Plan: 1. Obtain health maintenance screenings. - Labs requested to be done at [facility] for convenience to work. 2. Return 1 year for annual physical.   Relevant Orders   CBC with Differential/Platelet   Breast cancer screening     Pt due for repeat mammogram.  Plan: 1. Screening mammogram order placed.  Pt will call to schedule appointment.  Information given.   Relevant Orders   MM SCREENING BREAST W/IMPLANT TOMO BILATERAL   Osteoporosis screening     Pt postmenopausal w/o history of prior DEXA scan.    Plan: 1. Obtain DG bone density.  Pt will call to schedule appointment.  Information given.   Relevant Orders   DG Bone Density   Class 1 obesity due to excess calories without serious comorbidity with body mass index (BMI) of 33.0 to 33.9 in adult     Pt with desire for weight loss. Pt w/ plan to continue increasing exercise and healthier eating.  Plan: 1. Reviewed calorie intake and tips for reducing overall calories while staying full. 2. - Discussed MyFitnessPal.  Establish baseline intake and reduce by about 500 calories per day probably around 1400 - 1600 calories per day.  Continue for 1-2 weeks, then stop calorie counting, continue food log, and continue new eating pattern. - Weight loss goals 1/2 to 1 lb weekly. 3.Consider nutrition visits if covered by insurance or pt willing to pay out of pocket. 5. Follow up 1 year.      Follow up plan: Return in about 1 year (around  02/08/2018) for annual physical.  Cassell Smiles, DNP, AGPCNP-BC Adult Gerontology Primary Care Nurse Practitioner [facility] 02/08/2017, 3:37 PM

## 2017-02-13 ENCOUNTER — Encounter: Payer: Self-pay | Admitting: Nurse Practitioner

## 2017-02-19 ENCOUNTER — Other Ambulatory Visit
Admission: RE | Admit: 2017-02-19 | Discharge: 2017-02-19 | Disposition: A | Discharge status: Home / Self Care | Payer: 59 | Source: Ambulatory Visit | Attending: Nurse Practitioner | Admitting: Nurse Practitioner

## 2017-02-19 DIAGNOSIS — Z0000 Encounter for general adult medical examination without abnormal findings: Secondary | ICD-10-CM | POA: Insufficient documentation

## 2017-02-19 LAB — COMPREHENSIVE METABOLIC PANEL
ALT: 16 U/L (ref 14–54)
AST: 21 U/L (ref 15–41)
Albumin: 4.2 g/dL (ref 3.5–5.0)
Alkaline Phosphatase: 95 U/L (ref 38–126)
Anion gap: 8 (ref 5–15)
BUN: 17 mg/dL (ref 6–20)
CO2: 25 mmol/L (ref 22–32)
Calcium: 9.4 mg/dL (ref 8.9–10.3)
Chloride: 105 mmol/L (ref 101–111)
Creatinine, Ser: 0.86 mg/dL (ref 0.44–1.00)
GFR calc Af Amer: >60 mL/min (ref >60)
GFR calc non Af Amer: >60 mL/min (ref >60)
Glucose, Bld: 107 mg/dL — ABNORMAL HIGH (ref 65–99)
Potassium: 4 mmol/L (ref 3.5–5.1)
Sodium: 138 mmol/L (ref 135–145)
Total Bilirubin: 0.7 mg/dL (ref 0.3–1.2)
Total Protein: 7.3 g/dL (ref 6.5–8.1)

## 2017-02-19 LAB — TSH: TSH: 1.609 u[IU]/mL (ref 0.350–4.500)

## 2017-02-19 LAB — CBC WITH DIFFERENTIAL/PLATELET
Basophils Absolute: 0.1 10*3/uL (ref 0–0.1)
Basophils Relative: 1 %
Eosinophils Absolute: 0.1 10*3/uL (ref 0–0.7)
Eosinophils Relative: 2 %
HCT: 42.8 % (ref 35.0–47.0)
Hemoglobin: 14.3 g/dL (ref 12.0–16.0)
Lymphocytes Relative: 26 %
Lymphs Abs: 1.9 10*3/uL (ref 1.0–3.6)
MCH: 27.8 pg (ref 26.0–34.0)
MCHC: 33.4 g/dL (ref 32.0–36.0)
MCV: 83.2 fL (ref 80.0–100.0)
Monocytes Absolute: 0.5 10*3/uL (ref 0.2–0.9)
Monocytes Relative: 6 %
Neutro Abs: 4.9 10*3/uL (ref 1.4–6.5)
Neutrophils Relative %: 65 %
Platelets: 198 10*3/uL (ref 150–440)
RBC: 5.14 MIL/uL (ref 3.80–5.20)
RDW: 14.2 % (ref 11.5–14.5)
WBC: 7.5 10*3/uL (ref 3.6–11.0)

## 2017-02-19 LAB — LIPID PANEL
Cholesterol: 221 mg/dL — ABNORMAL HIGH (ref 0–200)
HDL: 60 mg/dL (ref >40)
LDL Cholesterol: 142 mg/dL — ABNORMAL HIGH (ref 0–99)
Total CHOL/HDL Ratio: 3.7 RATIO
Triglycerides: 96 mg/dL (ref <150)
VLDL: 19 mg/dL (ref 0–40)

## 2017-02-20 LAB — HEMOGLOBIN A1C
Hgb A1c MFr Bld: 5.3 % (ref 4.8–5.6)
Mean Plasma Glucose: 105 mg/dL

## 2017-02-20 LAB — VITAMIN D 25 HYDROXY (VIT D DEFICIENCY, FRACTURES): Vit D, 25-Hydroxy: 24.7 ng/mL — ABNORMAL LOW (ref 30.0–100.0)

## 2017-05-02 ENCOUNTER — Ambulatory Visit
Admission: RE | Admit: 2017-05-02 | Discharge: 2017-05-02 | Disposition: A | Discharge status: Home / Self Care | Payer: No Typology Code available for payment source | Source: Ambulatory Visit | Attending: Nurse Practitioner | Admitting: Nurse Practitioner

## 2017-05-02 DIAGNOSIS — Z1231 Encounter for screening mammogram for malignant neoplasm of breast: Secondary | ICD-10-CM | POA: Insufficient documentation

## 2017-05-02 DIAGNOSIS — Z9012 Acquired absence of left breast and nipple: Secondary | ICD-10-CM | POA: Insufficient documentation

## 2017-05-02 DIAGNOSIS — Z1239 Encounter for other screening for malignant neoplasm of breast: Secondary | ICD-10-CM

## 2017-05-04 ENCOUNTER — Encounter: Payer: Self-pay | Admitting: Nurse Practitioner

## 2017-05-10 ENCOUNTER — Encounter: Payer: Self-pay | Admitting: Family Medicine

## 2017-05-10 ENCOUNTER — Ambulatory Visit (INDEPENDENT_AMBULATORY_CARE_PROVIDER_SITE_OTHER): Payer: No Typology Code available for payment source | Admitting: Nurse Practitioner

## 2017-05-10 VITALS — BP 120/75 | HR 75 | Temp 98.4°F | Resp 16 | Ht 64.0 in | Wt 216.0 lb

## 2017-05-10 DIAGNOSIS — F419 Anxiety disorder, unspecified: Secondary | ICD-10-CM

## 2017-05-10 MED ORDER — PAROXETINE HCL 20 MG PO TABS: 20.0000 mg | ORAL_TABLET | Freq: Every day | ORAL | 5 refills | Status: DC

## 2017-05-10 MED ORDER — HYDROXYZINE HCL 25 MG PO TABS: 12.5000 mg | ORAL_TABLET | Freq: Three times a day (TID) | ORAL | 0 refills | Status: DC | PRN

## 2017-05-10 NOTE — Progress Notes (Signed)
Subjective:    Patient ID: Natasha Dennis, female    DOB: 08/23/1965, 52 y.o.   MRN: [id number]  Natasha Dennis is a 52 y.o. female presenting on 05/10/2017 for Anxiety   HPI Anxiety Pt reports she is dealing with increased life stressors over recent months.  She cites needing to keep boundaries to support, but not enable her son with his drug use.  He had been in rehab, but quit early and is now living with her at home mostly because her husband is allowing this.  Pt would prefer to keep firm to previously established boundaries, but feels no support with this decision from her husband.  Now having increased stress from son who is a drug addict and stress for relationship difficulties with her husband.   - Symptoms include increased anger and irritability, insomnia (hard to fall asleep, is restless, wakes frequently but is usually able to go back to sleep).   - Pt requests to start a medication that will help her cope with period of increased stress as it is making it hard for her to carry out daily activities.  . Depression screen [facility] 2/9 05/10/2017 02/08/2017 09/28/2015  Decreased Interest 0 0 0  Down, Depressed, Hopeless 1 0 0  PHQ - 2 Score 1 0 0  Altered sleeping 2 - -  Tired, decreased energy 0 - -  Change in appetite 0 - -  Feeling bad or failure about yourself  0 - -  Trouble concentrating 0 - -  Moving slowly or fidgety/restless 0 - -  Suicidal thoughts 0 - -  PHQ-9 Score 3 - -  Difficult doing work/chores Not difficult at all - -    GAD 7 : Generalized Anxiety Score 05/10/2017  Nervous, Anxious, on Edge 2  Control/stop worrying 2  Worry too much - different things 1  Trouble relaxing 1  Restless 0  Easily annoyed or irritable 1  Afraid - awful might happen 0  Total GAD 7 Score 7  Anxiety Difficulty Not difficult at all     Social History   Tobacco Use  . Smoking status: Former Smoker    Last attempt to quit: 03/05/1984    Years since quitting: 33.2  .  Smokeless tobacco: Never Used  Substance Use Topics  . Alcohol use: No  . Drug use: No    Review of Systems Per HPI unless specifically indicated above     Objective:    BP 120/75   Pulse 75   Temp 98.4 F (36.9 C) (Oral)   Resp 16   Ht 5\' 4"  (1.626 m)   Wt 216 lb (98 kg)   BMI 37.08 kg/m   Wt Readings from Last 3 Encounters:  05/10/17 216 lb (98 kg)  02/08/17 210 lb 3.2 oz (95.3 kg)  02/15/16 209 lb 14.1 oz (95.2 kg)    Physical Exam  Constitutional: She is oriented to person, place, and time. She appears well-developed and well-nourished. No distress.  HENT:  Head: Normocephalic and atraumatic.  Cardiovascular: Normal rate, regular rhythm, S1 normal, S2 normal, normal heart sounds and intact distal pulses.  Pulmonary/Chest: Effort normal and breath sounds normal. No respiratory distress.  Neurological: She is alert and oriented to person, place, and time.  Skin: Skin is warm and dry.  Psychiatric: Her behavior is normal. Judgment and thought content normal. Her mood appears anxious. Her speech is rapid and/or pressured. Cognition and memory are normal. She expresses no homicidal and no suicidal  ideation. She is communicative.  Vitals reviewed.       Assessment & Plan:   Problem List Items Addressed This Visit    None    Visit Diagnoses    Anxiety    -  Primary   Relevant Medications   PARoxetine (PAXIL) 20 MG tablet   hydrOXYzine (ATARAX/VISTARIL) 25 MG tablet    Acute increase in anxiety 2/2 increased life stressors.  Pt with interruption of coping mechanisms and has current inability to cope.  Increased sleep disturbance and ability to carry out daily work are now occurring.  She occasionally has uncontrollable tearfulness.  Plan: 1. Start paroxetine 20 mg once daily.  Take 1/2 tablet for 7 days, then take full tablet to reduce side effect potential. 2. Start hydroxyzine 25 mg tablet three times daily as needed for overwhelming anxiety.  Pt may have  drowsiness, cautioned to use only at night or when home to know how it will affect her. 3. Encouraged counseling.  Pt admits she is awaiting an appointment already. 4. Followup 4-6 weeks.    Meds ordered this encounter  Medications  . PARoxetine (PAXIL) 20 MG tablet    Sig: Take 1 tablet (20 mg total) by mouth daily.    Dispense:  30 tablet    Refill:  5    Order Specific Question:   Supervising Provider    Answer:   Olin Hauser [[id number]]  . hydrOXYzine (ATARAX/VISTARIL) 25 MG tablet    Sig: Take 0.5-1 tablets (12.5-25 mg total) by mouth 3 (three) times daily as needed for anxiety.    Dispense:  30 tablet    Refill:  0    Order Specific Question:   Supervising Provider    Answer:   Olin Hauser [[id number]]    Follow up plan: Return in about 6 weeks (around 06/21/2017) for anxiety.  Cassell Smiles, DNP, AGPCNP-BC Adult Gerontology Primary Care Nurse Practitioner [facility] 06/02/2017, 11:58 AM

## 2017-05-10 NOTE — Patient Instructions (Addendum)
[place], Thank you for coming in to clinic today.  1. START paroxetine 20 mg tablet.  Take 1/2 tablet once daily for first 7 days.  Then, increase to 1 full tablet.  2. START hydroxyzine 25 mg tablet.  Take 1/2 -1 tablet as needed for anxiety or sleep.  Psych Counseling ONLY Self Referral: 1. [facility] [place] [facility]   Address: [place], [place], [place] [address] Hours: Open today  9AM-7PM Phone: [phone]  2. [place], [place] Address: [address] [place], [place], [place] [address] Phone: [phone]   Please schedule a follow-up appointment with Cassell Smiles, AGNP. Return in about 6 weeks (around 06/21/2017) for anxiety.  If you have any other questions or concerns, please feel free to call the clinic or send a message through [place]. You may also schedule an earlier appointment if necessary.  You will receive a survey after today's visit either digitally by e-mail or paper by [employer]. Your experiences and feedback matter to [place].  Please respond so we know how we are doing as we provide care for you.   Cassell Smiles, DNP, AGNP-BC Adult Gerontology Nurse Practitioner [place]

## 2017-06-02 ENCOUNTER — Encounter: Payer: Self-pay | Admitting: Nurse Practitioner

## 2017-06-21 ENCOUNTER — Ambulatory Visit: Payer: No Typology Code available for payment source | Admitting: Nurse Practitioner

## 2017-06-28 ENCOUNTER — Encounter: Payer: Self-pay | Admitting: Nurse Practitioner

## 2017-06-28 ENCOUNTER — Ambulatory Visit (INDEPENDENT_AMBULATORY_CARE_PROVIDER_SITE_OTHER): Payer: No Typology Code available for payment source | Admitting: Nurse Practitioner

## 2017-06-28 ENCOUNTER — Other Ambulatory Visit: Payer: Self-pay

## 2017-06-28 ENCOUNTER — Telehealth: Payer: Self-pay

## 2017-06-28 VITALS — BP 121/70 | HR 106 | Temp 98.3°F | Ht 64.0 in | Wt 208.4 lb

## 2017-06-28 DIAGNOSIS — R000 Tachycardia, unspecified: Secondary | ICD-10-CM

## 2017-06-28 DIAGNOSIS — F419 Anxiety disorder, unspecified: Secondary | ICD-10-CM | POA: Diagnosis not present

## 2017-06-28 NOTE — Progress Notes (Signed)
Subjective:    Patient ID: Natasha Dennis, female    DOB: 31-Mar-1965, 52 y.o.   MRN: [id number]  Natasha Dennis is a 52 y.o. female presenting on 06/28/2017 for Anxiety and Tachycardia (pt state she fel her heart racing after working in the yard today.)   HPI Anxiety Stable, doing well.  Still having increased stressors, but reports she is more easily able to manage them without as much tearfulness.  Life stressors have improved, but pt is still working with keeping established boundaries in her relationships.  Pt has started paroxetine with good response.  Pt is experiencing no side effects of paroxetine.  Tachycardia Pt has heart racing after yardwork today.  It has now mostly resolved and has not occurred before.  Pt admits she is not as hydrated as she should be.   Social History   Tobacco Use  . Smoking status: Former Smoker    Last attempt to quit: 03/05/1984    Years since quitting: 33.4  . Smokeless tobacco: Never Used  Substance Use Topics  . Alcohol use: No  . Drug use: No    Review of Systems Per HPI unless specifically indicated above     Objective:    BP 121/70 (BP Location: Right Arm, Patient Position: Sitting, Cuff Size: Normal)   Pulse (!) 106   Temp 98.3 F (36.8 C) (Oral)   Ht 5\' 4"  (1.626 m)   Wt 208 lb 6.4 oz (94.5 kg)   BMI 35.77 kg/m   Wt Readings from Last 3 Encounters:  06/28/17 208 lb 6.4 oz (94.5 kg)  05/10/17 216 lb (98 kg)  02/08/17 210 lb 3.2 oz (95.3 kg)    Physical Exam  Constitutional: She is oriented to person, place, and time. She appears well-developed and well-nourished. No distress.  HENT:  Head: Normocephalic and atraumatic.  Cardiovascular: Regular rhythm, S1 normal, S2 normal, normal heart sounds and intact distal pulses. Tachycardia present.  Pulmonary/Chest: Effort normal and breath sounds normal. No respiratory distress.  Neurological: She is alert and oriented to person, place, and time.  Skin: Skin is warm and  dry.  Psychiatric: She has a normal mood and affect. Her speech is normal and behavior is normal. Judgment and thought content normal. Cognition and memory are normal. She expresses no homicidal and no suicidal ideation.  No tearfulness today on HPI/Exam.  Pt converses appropriately.  Vitals reviewed.   Results for orders placed or performed during the hospital encounter of 02/19/17  VITAMIN D 25 Hydroxy (Vit-D Deficiency, Fractures)  Result Value Ref Range   Vit D, 25-Hydroxy 24.7 (L) 30.0 - 100.0 ng/mL  Lipid panel  Result Value Ref Range   Cholesterol 221 (H) 0 - 200 mg/dL   Triglycerides 96 <150 mg/dL   HDL 60 >40 mg/dL   Total CHOL/HDL Ratio 3.7 RATIO   VLDL 19 0 - 40 mg/dL   LDL Cholesterol 142 (H) 0 - 99 mg/dL  Hemoglobin A1c  Result Value Ref Range   Hgb A1c MFr Bld 5.3 4.8 - 5.6 %   Mean Plasma Glucose 105 mg/dL  TSH  Result Value Ref Range   TSH 1.609 0.350 - 4.500 uIU/mL  Comprehensive metabolic panel  Result Value Ref Range   Sodium 138 135 - 145 mmol/L   Potassium 4.0 3.5 - 5.1 mmol/L   Chloride 105 101 - 111 mmol/L   CO2 25 22 - 32 mmol/L   Glucose, Bld 107 (H) 65 - 99 mg/dL   BUN  17 6 - 20 mg/dL   Creatinine, Ser 0.86 0.44 - 1.00 mg/dL   Calcium 9.4 8.9 - 10.3 mg/dL   Total Protein 7.3 6.5 - 8.1 g/dL   Albumin 4.2 3.5 - 5.0 g/dL   AST 21 15 - 41 U/L   ALT 16 14 - 54 U/L   Alkaline Phosphatase 95 38 - 126 U/L   Total Bilirubin 0.7 0.3 - 1.2 mg/dL   GFR calc non Af Amer >60 >60 mL/min   GFR calc Af Amer >60 >60 mL/min   Anion gap 8 5 - 15  CBC with Differential/Platelet  Result Value Ref Range   WBC 7.5 3.6 - 11.0 K/uL   RBC 5.14 3.80 - 5.20 MIL/uL   Hemoglobin 14.3 12.0 - 16.0 g/dL   HCT 42.8 35.0 - 47.0 %   MCV 83.2 80.0 - 100.0 fL   MCH 27.8 26.0 - 34.0 pg   MCHC 33.4 32.0 - 36.0 g/dL   RDW 14.2 11.5 - 14.5 %   Platelets 198 150 - 440 K/uL   Neutrophils Relative % 65 %   Neutro Abs 4.9 1.4 - 6.5 K/uL   Lymphocytes Relative 26 %   Lymphs Abs  1.9 1.0 - 3.6 K/uL   Monocytes Relative 6 %   Monocytes Absolute 0.5 0.2 - 0.9 K/uL   Eosinophils Relative 2 %   Eosinophils Absolute 0.1 0 - 0.7 K/uL   Basophils Relative 1 %   Basophils Absolute 0.1 0 - 0.1 K/uL      Assessment & Plan:   Problem List Items Addressed This Visit    None    Visit Diagnoses    Tachycardia    -  Primary Improving.  Is likely response to exercise and possible dehydration.  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  Recommend to pt that if pulse less than 100 at bedtime, can proceed with regular routine.  If over 100 at bedtime/tomorrow, should seek urgent care to proceed with EKG and determine possible cause.  Pt declines additional workup today.  Encouraged good hydration and moderation of physical activity in future.    Anxiety     Improved today on exam.  Medications tolerated without side effects.  Continue at current doses.  Refills provided.   . Followup 3 months.       Follow up plan: Return in about 3 months (around 09/27/2017) for anxiety AND sooner if needed for elevated HR.  Cassell Smiles, DNP, AGPCNP-BC Adult Gerontology Primary Care Nurse Practitioner [facility] 07/29/2017, 8:59 AM

## 2017-06-28 NOTE — Telephone Encounter (Signed)
The pt called back stating she forgot she took a 24 hr sudafed this afternoon before coming in. She think this could be the cause of her elevated heart rate today at her visit.

## 2017-06-28 NOTE — Patient Instructions (Addendum)
Karma Ganja,   Thank you for coming in to clinic today.  1. Continue medications without changes.  2. Check your pulse again tonight.  If near 100, you can go to bed.  If over 100 tomorrow, please seek care at Urgent Care.  Please schedule a follow-up appointment with Cassell Smiles, AGNP. Return in about 3 months (around 09/27/2017) for anxiety AND sooner if needed for elevated HR.  If you have any other questions or concerns, please feel free to call the clinic or send a message through [place]. You may also schedule an earlier appointment if necessary.  You will receive a survey after today's visit either digitally by e-mail or paper by [employer]. Your experiences and feedback matter to [place].  Please respond so we know how we are doing as we provide care for you.   Cassell Smiles, DNP, AGNP-BC Adult Gerontology Nurse Practitioner [place]

## 2017-06-28 NOTE — Telephone Encounter (Signed)
Yes.  This is most definitely possible.  Avoid sudafed if at all possible.

## 2017-07-29 ENCOUNTER — Encounter: Payer: Self-pay | Admitting: Nurse Practitioner

## 2017-08-13 ENCOUNTER — Ambulatory Visit
Admission: RE | Admit: 2017-08-13 | Discharge: 2017-08-13 | Disposition: A | Discharge status: Home / Self Care | Payer: No Typology Code available for payment source | Source: Ambulatory Visit | Attending: Nurse Practitioner | Admitting: Nurse Practitioner

## 2017-08-13 ENCOUNTER — Other Ambulatory Visit: Payer: Self-pay | Admitting: Nurse Practitioner

## 2017-08-13 DIAGNOSIS — Z13820 Encounter for screening for osteoporosis: Secondary | ICD-10-CM | POA: Insufficient documentation

## 2017-09-27 ENCOUNTER — Ambulatory Visit: Payer: No Typology Code available for payment source | Admitting: Nurse Practitioner

## 2017-10-11 ENCOUNTER — Ambulatory Visit (INDEPENDENT_AMBULATORY_CARE_PROVIDER_SITE_OTHER): Payer: No Typology Code available for payment source | Admitting: Nurse Practitioner

## 2017-10-11 ENCOUNTER — Other Ambulatory Visit: Payer: Self-pay

## 2017-10-11 ENCOUNTER — Encounter: Payer: Self-pay | Admitting: Nurse Practitioner

## 2017-10-11 DIAGNOSIS — K219 Gastro-esophageal reflux disease without esophagitis: Secondary | ICD-10-CM | POA: Diagnosis not present

## 2017-10-11 DIAGNOSIS — F419 Anxiety disorder, unspecified: Secondary | ICD-10-CM | POA: Diagnosis not present

## 2017-10-11 MED ORDER — OMEPRAZOLE 20 MG PO CPDR: 20.0000 mg | DELAYED_RELEASE_CAPSULE | Freq: Every day | ORAL | 3 refills | Status: DC

## 2017-10-11 MED ORDER — HYDROXYZINE HCL 25 MG PO TABS: 12.5000 mg | ORAL_TABLET | Freq: Three times a day (TID) | ORAL | 0 refills | Status: DC | PRN

## 2017-10-11 MED ORDER — PAROXETINE HCL 20 MG PO TABS: 20.0000 mg | ORAL_TABLET | Freq: Every day | ORAL | 5 refills | Status: DC

## 2017-10-11 NOTE — Progress Notes (Signed)
Subjective:    Patient ID: Natasha Dennis, female    DOB: 01/02/1966, 52 y.o.   MRN: [id number]  Natasha Dennis is a 52 y.o. female presenting on 10/11/2017 for Anxiety   HPI Anxiety Presents today for follow-up of anxiety with some mild depression. She notes she is feeling much better and has the ability to function through life without difficulty even though she still has routine sources of stress with husband going through chemo/radiation and her son continuing to make poor life choices.   - Sleeping well overall. Occasionally wakes some during the night, but this was present prior.  Shoulder aches cause need for repositioning. - Tolerating medication well without side effects. Does not desire to increase or reduce at this time.  - Family members state she seems to be getting back to her normal moods.  GAD 7 : Generalized Anxiety Score 10/11/2017 05/10/2017  Nervous, Anxious, on Edge 0 2  Control/stop worrying 0 2  Worry too much - different things 0 1  Trouble relaxing 0 1  Restless 0 0  Easily annoyed or irritable 0 1  Afraid - awful might happen 0 0  Total GAD 7 Score 0 7  Anxiety Difficulty Not difficult at all Not difficult at all     Depression screen [facility] 2/9 10/11/2017 05/10/2017 02/08/2017 09/28/2015  Decreased Interest 0 0 0 0  Down, Depressed, Hopeless 0 1 0 0  PHQ - 2 Score 0 1 0 0  Altered sleeping 1 2 - -  Tired, decreased energy 0 0 - -  Change in appetite 0 0 - -  Feeling bad or failure about yourself  0 0 - -  Trouble concentrating 0 0 - -  Moving slowly or fidgety/restless 0 0 - -  Suicidal thoughts 0 0 - -  PHQ-9 Score 1 3 - -  Difficult doing work/chores Not difficult at all Not difficult at all - -     GERD Heartburn nearly daily and is severe at night. Coughing and burning in throat occurs 2-3 times per week. Has intermittent abdominal pain. She is not currently taking any acid reducer for last 1 month.  Has previously taken omeprazole and tolerated  well.  No signs and symptoms of bleeding to include coffee ground emesis, dark/tarry stools, or BRBPR.     Social History   Tobacco Use  . Smoking status: Former Smoker    Last attempt to quit: 03/05/1984    Years since quitting: 33.6  . Smokeless tobacco: Never Used  Substance Use Topics  . Alcohol use: No  . Drug use: No    Review of Systems Per HPI unless specifically indicated above     Objective:    BP 107/66 (BP Location: Right Arm, Patient Position: Sitting, Cuff Size: Normal)   Pulse 79   Temp 98.4 F (36.9 C) (Oral)   Ht 5\' 5"  (1.651 m)   Wt 207 lb (93.9 kg)   BMI 34.45 kg/m   Wt Readings from Last 3 Encounters:  10/11/17 207 lb (93.9 kg)  06/28/17 208 lb 6.4 oz (94.5 kg)  05/10/17 216 lb (98 kg)    Physical Exam  Constitutional: She is oriented to person, place, and time. She appears well-developed and well-nourished. No distress.  HENT:  Head: Normocephalic and atraumatic.  Cardiovascular: Normal rate, regular rhythm, S1 normal, S2 normal, normal heart sounds and intact distal pulses.  Pulmonary/Chest: Effort normal and breath sounds normal. No respiratory distress.  Abdominal: Soft. Bowel  sounds are normal. She exhibits no distension. There is no hepatosplenomegaly. There is tenderness in the epigastric area. There is no rigidity, no rebound, no guarding, no CVA tenderness, no tenderness at McBurney's point and negative Murphy's sign. No hernia.  Neurological: She is alert and oriented to person, place, and time.  Skin: Skin is warm and dry. Capillary refill takes less than 2 seconds.  Psychiatric: She has a normal mood and affect. Her behavior is normal. Judgment and thought content normal.  Vitals reviewed.   Results for orders placed or performed during the hospital encounter of 02/19/17  VITAMIN D 25 Hydroxy (Vit-D Deficiency, Fractures)  Result Value Ref Range   Vit D, 25-Hydroxy 24.7 (L) 30.0 - 100.0 ng/mL  Lipid panel  Result Value Ref Range    Cholesterol 221 (H) 0 - 200 mg/dL   Triglycerides 96 <150 mg/dL   HDL 60 >40 mg/dL   Total CHOL/HDL Ratio 3.7 RATIO   VLDL 19 0 - 40 mg/dL   LDL Cholesterol 142 (H) 0 - 99 mg/dL  Hemoglobin A1c  Result Value Ref Range   Hgb A1c MFr Bld 5.3 4.8 - 5.6 %   Mean Plasma Glucose 105 mg/dL  TSH  Result Value Ref Range   TSH 1.609 0.350 - 4.500 uIU/mL  Comprehensive metabolic panel  Result Value Ref Range   Sodium 138 135 - 145 mmol/L   Potassium 4.0 3.5 - 5.1 mmol/L   Chloride 105 101 - 111 mmol/L   CO2 25 22 - 32 mmol/L   Glucose, Bld 107 (H) 65 - 99 mg/dL   BUN 17 6 - 20 mg/dL   Creatinine, Ser 0.86 0.44 - 1.00 mg/dL   Calcium 9.4 8.9 - 10.3 mg/dL   Total Protein 7.3 6.5 - 8.1 g/dL   Albumin 4.2 3.5 - 5.0 g/dL   AST 21 15 - 41 U/L   ALT 16 14 - 54 U/L   Alkaline Phosphatase 95 38 - 126 U/L   Total Bilirubin 0.7 0.3 - 1.2 mg/dL   GFR calc non Af Amer >60 >60 mL/min   GFR calc Af Amer >60 >60 mL/min   Anion gap 8 5 - 15  CBC with Differential/Platelet  Result Value Ref Range   WBC 7.5 3.6 - 11.0 K/uL   RBC 5.14 3.80 - 5.20 MIL/uL   Hemoglobin 14.3 12.0 - 16.0 g/dL   HCT 42.8 35.0 - 47.0 %   MCV 83.2 80.0 - 100.0 fL   MCH 27.8 26.0 - 34.0 pg   MCHC 33.4 32.0 - 36.0 g/dL   RDW 14.2 11.5 - 14.5 %   Platelets 198 150 - 440 K/uL   Neutrophils Relative % 65 %   Neutro Abs 4.9 1.4 - 6.5 K/uL   Lymphocytes Relative 26 %   Lymphs Abs 1.9 1.0 - 3.6 K/uL   Monocytes Relative 6 %   Monocytes Absolute 0.5 0.2 - 0.9 K/uL   Eosinophils Relative 2 %   Eosinophils Absolute 0.1 0 - 0.7 K/uL   Basophils Relative 1 %   Basophils Absolute 0.1 0 - 0.1 K/uL      Assessment & Plan:   Problem List Items Addressed This Visit      Digestive   GERD (gastroesophageal reflux disease) Currently uncontrolled not on medication.  Previously well controlled on omeprazole 20 mg once daily.  Plan: 1. Resume omeprazole 20 mg once daily. Side effects discussed. Benefit outweighs risk. 2. Avoid  diet triggers. Reviewed need to seek care  if globus sensation, difficulty swallowing, s/sx of GI bleed. 3. Follow up as needed and in 6 months.    Relevant Medications   omeprazole (PRILOSEC) 20 MG capsule    Other Visit Diagnoses    Anxiety     Controlled today on exam.  Medications tolerated without side effects.    Plan: 1. Continue medications  at current doses.  Refills provided. 2. Continue to encourage counseling.  3. Followup 6 months or sooner if needed.   Relevant Medications   PARoxetine (PAXIL) 20 MG tablet   hydrOXYzine (ATARAX/VISTARIL) 25 MG tablet      Meds ordered this encounter  Medications  . PARoxetine (PAXIL) 20 MG tablet    Sig: Take 1 tablet (20 mg total) by mouth daily.    Dispense:  30 tablet    Refill:  5    Order Specific Question:   Supervising Provider    Answer:   Olin Hauser [[id number]]  . hydrOXYzine (ATARAX/VISTARIL) 25 MG tablet    Sig: Take 0.5-1 tablets (12.5-25 mg total) by mouth 3 (three) times daily as needed for anxiety.    Dispense:  30 tablet    Refill:  0    Order Specific Question:   Supervising Provider    Answer:   Olin Hauser [[id number]]  . omeprazole (PRILOSEC) 20 MG capsule    Sig: Take 1 capsule (20 mg total) by mouth daily.    Dispense:  90 capsule    Refill:  3    Order Specific Question:   Supervising Provider    Answer:   Olin Hauser [[id number]]    Follow up plan: Return in about 6 months (around 04/13/2018) for anxiety, GERD.  Cassell Smiles, DNP, AGPCNP-BC Adult Gerontology Primary Care Nurse Practitioner [facility] 10/11/2017, 2:02 PM

## 2017-10-11 NOTE — Patient Instructions (Addendum)
Natasha Dennis,   Thank you for coming in to clinic today.  1. Continue medications without change except restart omeprazole daily.  Please schedule a follow-up appointment with Cassell Smiles, AGNP. Return in about 6 months (around 04/13/2018) for anxiety, GERD.  If you have any other questions or concerns, please feel free to call the clinic or send a message through [place]. You may also schedule an earlier appointment if necessary.  You will receive a survey after today's visit either digitally by e-mail or paper by [employer]. Your experiences and feedback matter to [place].  Please respond so we know how we are doing as we provide care for you.   Cassell Smiles, DNP, AGNP-BC Adult Gerontology Nurse Practitioner [place]

## 2018-02-14 ENCOUNTER — Encounter: Payer: No Typology Code available for payment source | Admitting: Nurse Practitioner

## 2018-02-14 ENCOUNTER — Encounter: Payer: 59 | Admitting: Family Medicine

## 2018-03-14 ENCOUNTER — Ambulatory Visit: Payer: No Typology Code available for payment source | Admitting: Nurse Practitioner

## 2018-04-18 ENCOUNTER — Ambulatory Visit (INDEPENDENT_AMBULATORY_CARE_PROVIDER_SITE_OTHER): Payer: No Typology Code available for payment source | Admitting: Nurse Practitioner

## 2018-04-18 ENCOUNTER — Encounter: Payer: Self-pay | Admitting: Nurse Practitioner

## 2018-04-18 ENCOUNTER — Other Ambulatory Visit: Payer: Self-pay

## 2018-04-18 VITALS — BP 125/68 | HR 80 | Temp 98.1°F | Ht 65.0 in | Wt 211.4 lb

## 2018-04-18 DIAGNOSIS — B351 Tinea unguium: Secondary | ICD-10-CM

## 2018-04-18 DIAGNOSIS — F419 Anxiety disorder, unspecified: Secondary | ICD-10-CM | POA: Diagnosis not present

## 2018-04-18 DIAGNOSIS — Z23 Encounter for immunization: Secondary | ICD-10-CM | POA: Diagnosis not present

## 2018-04-18 DIAGNOSIS — K219 Gastro-esophageal reflux disease without esophagitis: Secondary | ICD-10-CM | POA: Diagnosis not present

## 2018-04-18 MED ORDER — OMEPRAZOLE 20 MG PO CPDR: 20.0000 mg | DELAYED_RELEASE_CAPSULE | Freq: Two times a day (BID) | ORAL | 3 refills | Status: DC

## 2018-04-18 MED ORDER — CICLOPIROX 8 % EX SOLN: Freq: Every day | CUTANEOUS | 0 refills | Status: DC

## 2018-04-18 MED ORDER — ZOSTER VAC RECOMB ADJUVANTED 50 MCG/0.5ML IM SUSR: 0.5000 mL | Freq: Once | INTRAMUSCULAR | 0 refills | Status: DC

## 2018-04-18 NOTE — Progress Notes (Signed)
Subjective:    Patient ID: Karma Ganja, female    DOB: 1965-09-06, 53 y.o.   MRN: [id number]  JAILEIGH WEIMER is a 53 y.o. female presenting on 04/18/2018 for Anxiety and Gastroesophageal Reflux   HPI GERD Reflux is "bad" even with taking omeprazole 20 mg once daily.  Patient is regularly waking cough with reflux symptoms and regurgitation with forward bending.  Bothers significantly 3-4 times per week.    Anxiety Patient takes hydroxyzine rarely.  Has not needed any over last several months. - Patient is not usually overwhelmed at work.  At home,  Husband had good results and feeling better, but still doing caregiving for her husband.  Fungal nail Patient has tried OTC meds without any relief.  Asks about any prescription treatment today.   GAD 7 : Generalized Anxiety Score 04/18/2018 10/11/2017 05/10/2017  Nervous, Anxious, on Edge 0 0 2  Control/stop worrying 0 0 2  Worry too much - different things 0 0 1  Trouble relaxing 0 0 1  Restless 0 0 0  Easily annoyed or irritable 0 0 1  Afraid - awful might happen 0 0 0  Total GAD 7 Score 0 0 7  Anxiety Difficulty Not difficult at all Not difficult at all Not difficult at all     Depression screen [facility] 2/9 04/18/2018 10/11/2017 05/10/2017 02/08/2017 09/28/2015  Decreased Interest 0 0 0 0 0  Down, Depressed, Hopeless 0 0 1 0 0  PHQ - 2 Score 0 0 1 0 0  Altered sleeping 0 1 2 - -  Tired, decreased energy 0 0 0 - -  Change in appetite 0 0 0 - -  Feeling bad or failure about yourself  0 0 0 - -  Trouble concentrating 0 0 0 - -  Moving slowly or fidgety/restless 0 0 0 - -  Suicidal thoughts 0 0 0 - -  PHQ-9 Score 0 1 3 - -  Difficult doing work/chores Not difficult at all Not difficult at all Not difficult at all - -     Social History   Tobacco Use  . Smoking status: Former Smoker    Last attempt to quit: 03/05/1984    Years since quitting: 34.1  . Smokeless tobacco: Never Used  Substance Use Topics  . Alcohol use: No  .  Drug use: No    Review of Systems Per HPI unless specifically indicated above     Objective:    BP 125/68 (BP Location: Right Arm, Patient Position: Sitting, Cuff Size: Normal)   Pulse 80   Temp 98.1 F (36.7 C) (Oral)   Ht 5\' 5"  (1.651 m)   Wt 211 lb 6.4 oz (95.9 kg)   BMI 35.18 kg/m   Wt Readings from Last 3 Encounters:  04/18/18 211 lb 6.4 oz (95.9 kg)  10/11/17 207 lb (93.9 kg)  06/28/17 208 lb 6.4 oz (94.5 kg)    Physical Exam Vitals signs reviewed.  Constitutional:      General: She is not in acute distress.    Appearance: She is well-developed.  HENT:     Head: Normocephalic and atraumatic.  Cardiovascular:     Rate and Rhythm: Normal rate and regular rhythm.     Heart sounds: Normal heart sounds, S1 normal and S2 normal.  Pulmonary:     Effort: Pulmonary effort is normal. No respiratory distress.     Breath sounds: Normal breath sounds.  Abdominal:     General: Bowel sounds are  normal. There is no distension.     Palpations: Abdomen is soft. Abdomen is not rigid.     Tenderness: There is abdominal tenderness (mild) in the epigastric area. There is no guarding or rebound. Negative signs include Murphy's sign and McBurney's sign.     Hernia: No hernia is present.  Skin:    General: Skin is warm and dry.     Capillary Refill: Capillary refill takes less than 2 seconds.  Neurological:     Mental Status: She is alert and oriented to person, place, and time.  Psychiatric:        Mood and Affect: Mood normal.        Behavior: Behavior normal.        Thought Content: Thought content normal.        Judgment: Judgment normal.    Results for orders placed or performed during the hospital encounter of 02/19/17  VITAMIN D 25 Hydroxy (Vit-D Deficiency, Fractures)  Result Value Ref Range   Vit D, 25-Hydroxy 24.7 (L) 30.0 - 100.0 ng/mL  Lipid panel  Result Value Ref Range   Cholesterol 221 (H) 0 - 200 mg/dL   Triglycerides 96 <150 mg/dL   HDL 60 >40 mg/dL   Total  CHOL/HDL Ratio 3.7 RATIO   VLDL 19 0 - 40 mg/dL   LDL Cholesterol 142 (H) 0 - 99 mg/dL  Hemoglobin A1c  Result Value Ref Range   Hgb A1c MFr Bld 5.3 4.8 - 5.6 %   Mean Plasma Glucose 105 mg/dL  TSH  Result Value Ref Range   TSH 1.609 0.350 - 4.500 uIU/mL  Comprehensive metabolic panel  Result Value Ref Range   Sodium 138 135 - 145 mmol/L   Potassium 4.0 3.5 - 5.1 mmol/L   Chloride 105 101 - 111 mmol/L   CO2 25 22 - 32 mmol/L   Glucose, Bld 107 (H) 65 - 99 mg/dL   BUN 17 6 - 20 mg/dL   Creatinine, Ser 0.86 0.44 - 1.00 mg/dL   Calcium 9.4 8.9 - 10.3 mg/dL   Total Protein 7.3 6.5 - 8.1 g/dL   Albumin 4.2 3.5 - 5.0 g/dL   AST 21 15 - 41 U/L   ALT 16 14 - 54 U/L   Alkaline Phosphatase 95 38 - 126 U/L   Total Bilirubin 0.7 0.3 - 1.2 mg/dL   GFR calc non Af Amer >60 >60 mL/min   GFR calc Af Amer >60 >60 mL/min   Anion gap 8 5 - 15  CBC with Differential/Platelet  Result Value Ref Range   WBC 7.5 3.6 - 11.0 K/uL   RBC 5.14 3.80 - 5.20 MIL/uL   Hemoglobin 14.3 12.0 - 16.0 g/dL   HCT 42.8 35.0 - 47.0 %   MCV 83.2 80.0 - 100.0 fL   MCH 27.8 26.0 - 34.0 pg   MCHC 33.4 32.0 - 36.0 g/dL   RDW 14.2 11.5 - 14.5 %   Platelets 198 150 - 440 K/uL   Neutrophils Relative % 65 %   Neutro Abs 4.9 1.4 - 6.5 K/uL   Lymphocytes Relative 26 %   Lymphs Abs 1.9 1.0 - 3.6 K/uL   Monocytes Relative 6 %   Monocytes Absolute 0.5 0.2 - 0.9 K/uL   Eosinophils Relative 2 %   Eosinophils Absolute 0.1 0 - 0.7 K/uL   Basophils Relative 1 %   Basophils Absolute 0.1 0 - 0.1 K/uL      Assessment & Plan:   Problem List  Items Addressed This Visit      Digestive   GERD (gastroesophageal reflux disease) Currently improved, but not completely controlled on omeprazole 20 mg once daily.  Plan: 1. Increase to omeprazole 20 mg bid ac. Side effects discussed. 2. Avoid diet triggers. Reviewed need to seek care if globus sensation, difficulty swallowing, s/sx of GI bleed. 3. Follow up as needed and in 6  months.    Relevant Medications   omeprazole (PRILOSEC) 20 MG capsule     Musculoskeletal and Integument   Fungal nail infection - Primary Chronic onchymycosis not improved with otc topicals.    Plan: - Discussed harms of oral medications.  Patient declines to use. - START Penlac one application nightly.  After 7 nights, remove with alcohol.  Continue x 1 year up to 18 months. - Follow-up 6 months.   Relevant Medications   ciclopirox (PENLAC) 8 % solution     Other   Anxiety Stable today on exam.  Medications tolerated without side effects.  Continue at current doses.  Refills provided.  encouraged ongoing non-pharm management of stress.. Followup 6 months.    Other Visit Diagnoses    Need for shingles vaccine       Relevant Orders   Varicella-zoster vaccine IM (Shingrix) (Completed)      Meds ordered this encounter  Medications  . omeprazole (PRILOSEC) 20 MG capsule    Sig: Take 1 capsule (20 mg total) by mouth 2 (two) times daily before a meal.    Dispense:  180 capsule    Refill:  3    Order Specific Question:   Supervising Provider    Answer:   Olin Hauser [[id number]]  . ciclopirox (PENLAC) 8 % solution    Sig: Apply topically at bedtime. Apply over nail and skin. Apply daily over previous coat. After seven (7) days, remove with alcohol and continue    Dispense:  6.6 mL    Refill:  0    Order Specific Question:   Supervising Provider    Answer:   Olin Hauser [[id number]]  .  Zoster Vaccine Adjuvanted [facility]) injection    Sig: Inject 0.5 mLs into the muscle once for 1 dose.    Dispense:  0.5 mL    Refill:  0    Order Specific Question:   Supervising Provider    Answer:   Olin Hauser [[id number]]    Follow up plan: Return in about 6 months (around 10/17/2018) for anxiety, GERD.  Cassell Smiles, DNP, AGPCNP-BC Adult Gerontology Primary Care Nurse Practitioner [facility] 04/18/2018, 1:57 PM

## 2018-04-18 NOTE — Patient Instructions (Addendum)
Natasha Dennis,   Thank you for coming in to clinic today.  1. Continue paroxetine 20 mg once daily.   2. Start fungal nail treatment nightly. Remove with alcohol every 7 days  Use for full year.  We may extend up to additional 9 months if needed.  3. Increase omeprazole to 20 mg one tablet twice daily before meals.  Take about 30 minutes before breakfast and supper for best results. - may also use Pepcid nightly for 7 nights as needed for symptom control.  Please schedule a follow-up appointment with Cassell Smiles, AGNP. Return in about 6 months (around 10/17/2018) for anxiety, GERD.  If you have any other questions or concerns, please feel free to call the clinic or send a message through [place]. You may also schedule an earlier appointment if necessary.  You will receive a survey after today's visit either digitally by e-mail or paper by [employer]. Your experiences and feedback matter to [place].  Please respond so we know how we are doing as we provide care for you.   Cassell Smiles, DNP, AGNP-BC Adult Gerontology Nurse Practitioner [place]

## 2018-04-21 ENCOUNTER — Encounter: Payer: Self-pay | Admitting: Nurse Practitioner

## 2018-04-21 DIAGNOSIS — B351 Tinea unguium: Secondary | ICD-10-CM | POA: Insufficient documentation

## 2018-04-21 DIAGNOSIS — F419 Anxiety disorder, unspecified: Secondary | ICD-10-CM | POA: Insufficient documentation

## 2018-07-08 ENCOUNTER — Other Ambulatory Visit: Payer: Self-pay | Admitting: Nurse Practitioner

## 2018-07-08 DIAGNOSIS — F419 Anxiety disorder, unspecified: Secondary | ICD-10-CM

## 2018-07-14 ENCOUNTER — Encounter: Payer: Self-pay | Admitting: Nurse Practitioner

## 2018-07-15 ENCOUNTER — Other Ambulatory Visit: Payer: Self-pay | Admitting: Nurse Practitioner

## 2018-07-15 DIAGNOSIS — Z1231 Encounter for screening mammogram for malignant neoplasm of breast: Secondary | ICD-10-CM

## 2018-09-19 ENCOUNTER — Other Ambulatory Visit: Payer: Self-pay | Admitting: Nurse Practitioner

## 2018-09-19 ENCOUNTER — Other Ambulatory Visit: Payer: Self-pay

## 2018-09-19 ENCOUNTER — Ambulatory Visit
Admission: RE | Admit: 2018-09-19 | Discharge: 2018-09-19 | Disposition: A | Discharge status: Home / Self Care | Payer: No Typology Code available for payment source | Source: Ambulatory Visit | Attending: Nurse Practitioner | Admitting: Nurse Practitioner

## 2018-09-19 DIAGNOSIS — Z1231 Encounter for screening mammogram for malignant neoplasm of breast: Secondary | ICD-10-CM | POA: Insufficient documentation

## 2018-10-06 ENCOUNTER — Ambulatory Visit
Admission: RE | Admit: 2018-10-06 | Discharge: 2018-10-06 | Disposition: A | Discharge status: Home / Self Care | Payer: No Typology Code available for payment source | Source: Ambulatory Visit | Attending: Nurse Practitioner | Admitting: Nurse Practitioner

## 2018-10-06 ENCOUNTER — Encounter: Payer: Self-pay | Admitting: Nurse Practitioner

## 2018-10-06 ENCOUNTER — Other Ambulatory Visit: Payer: Self-pay

## 2018-10-06 DIAGNOSIS — M25511 Pain in right shoulder: Secondary | ICD-10-CM | POA: Insufficient documentation

## 2018-10-06 DIAGNOSIS — G8929 Other chronic pain: Secondary | ICD-10-CM | POA: Insufficient documentation

## 2018-10-09 ENCOUNTER — Encounter: Payer: Self-pay | Admitting: Nurse Practitioner

## 2018-10-09 DIAGNOSIS — Z0000 Encounter for general adult medical examination without abnormal findings: Secondary | ICD-10-CM

## 2018-10-09 NOTE — Addendum Note (Signed)
Addended by: Cleaster Corin on: 10/09/2018 03:04 PM   Modules accepted: Orders

## 2018-10-14 NOTE — Addendum Note (Signed)
Addended by: Cleaster Corin on: 10/14/2018 10:32 AM   Modules accepted: Orders

## 2018-10-14 NOTE — Addendum Note (Signed)
Addended by: Cleaster Corin on: 10/14/2018 10:33 AM   Modules accepted: Orders

## 2018-10-14 NOTE — Addendum Note (Signed)
Addended by: Cleaster Corin on: 10/14/2018 10:31 AM   Modules accepted: Orders

## 2018-10-15 ENCOUNTER — Ambulatory Visit (HOSPITAL_BASED_OUTPATIENT_CLINIC_OR_DEPARTMENT_OTHER)
Payer: No Typology Code available for payment source | Admitting: Student in an Organized Health Care Education/Training Program

## 2018-10-15 ENCOUNTER — Ambulatory Visit
Admission: RE | Admit: 2018-10-15 | Discharge: 2018-10-15 | Disposition: A | Discharge status: Home / Self Care | Payer: No Typology Code available for payment source | Source: Ambulatory Visit | Attending: Student in an Organized Health Care Education/Training Program | Admitting: Student in an Organized Health Care Education/Training Program

## 2018-10-15 ENCOUNTER — Other Ambulatory Visit: Payer: Self-pay

## 2018-10-15 ENCOUNTER — Encounter: Payer: Self-pay | Admitting: Student in an Organized Health Care Education/Training Program

## 2018-10-15 VITALS — BP 128/88 | HR 78 | Temp 98.0°F | Resp 16 | Ht 65.0 in | Wt 208.0 lb

## 2018-10-15 DIAGNOSIS — G8929 Other chronic pain: Secondary | ICD-10-CM | POA: Insufficient documentation

## 2018-10-15 DIAGNOSIS — M19011 Primary osteoarthritis, right shoulder: Secondary | ICD-10-CM | POA: Diagnosis present

## 2018-10-15 DIAGNOSIS — M25511 Pain in right shoulder: Secondary | ICD-10-CM

## 2018-10-15 MED ORDER — LIDOCAINE HCL 2 % IJ SOLN
INTRAMUSCULAR | Status: AC
  Filled 2018-10-15: qty 20

## 2018-10-15 MED ORDER — DEXAMETHASONE SODIUM PHOSPHATE 10 MG/ML IJ SOLN: 10.0000 mg | Freq: Once | INTRAMUSCULAR | Status: DC

## 2018-10-15 MED ORDER — ROPIVACAINE HCL 2 MG/ML IJ SOLN: 1.0000 mL | Freq: Once | INTRAMUSCULAR | Status: DC

## 2018-10-15 MED ORDER — ROPIVACAINE HCL 2 MG/ML IJ SOLN
INTRAMUSCULAR | Status: AC
  Filled 2018-10-15: qty 10

## 2018-10-15 MED ORDER — DEXAMETHASONE SODIUM PHOSPHATE 10 MG/ML IJ SOLN
INTRAMUSCULAR | Status: AC
  Filled 2018-10-15: qty 1

## 2018-10-15 MED ORDER — LIDOCAINE HCL 2 % IJ SOLN
20.0000 mL | Freq: Once | INTRAMUSCULAR | Status: AC
  Administered 2018-10-15: 13:00:00 400 mg

## 2018-10-15 MED ORDER — IOHEXOL 180 MG/ML  SOLN: 10.0000 mL | Freq: Once | INTRAMUSCULAR | Status: DC

## 2018-10-15 NOTE — Progress Notes (Signed)
Patient's Name: Natasha Dennis  MRN: [id number]  Referring Provider: Mikey College, *  DOB: 1965/09/25  PCP: Mikey College, NP  DOS: 10/15/2018  Note by: Gillis Santa, MD  Service setting: Ambulatory outpatient  Specialty: Interventional Pain Management  Patient type: Established  Location: [facility] (AMB) Pain Management Facility  Visit type: Interventional Procedure   Primary Reason for Visit: Interventional Pain Management Treatment. CC: Shoulder Pain (right)  Procedure:          Anesthesia, Analgesia, Anxiolysis:  Type: Diagnostic Glenohumeral Joint (shoulder) Injection #1  Primary Purpose: Diagnostic Region: Superior Shoulder Area Level:  Shoulder Target Area: Glenohumeral Joint (shoulder) Approach: Posterior approach. Laterality: Right-Sided  Type: Local Anesthesia  Local Anesthetic: Lidocaine 1-2%  Position: Supine   Indications: 1. Primary osteoarthritis of right shoulder   2. Arthritis of right glenohumeral joint   3. Chronic right shoulder pain    Pain Score: Pre-procedure: 5 /10 Post-procedure: 0-No pain/53   53 year old female with a history of right shoulder pain.  Pain is worse with abduction and lateral arm raise.  X-rays show degenerative changes in the right acromioclavicular and glenohumeral joints with joint space narrowing and spurring.  Has tried and failed conservative oral therapies with NSAIDs.  Discussed intra-articular right shoulder steroid injection.  Risks and benefits reviewed and patient like to proceed.  Pre-op Assessment:  Natasha Dennis is a 53 y.o. (year old), female patient, seen today for interventional treatment. She  has a past surgical history that includes Abdominal hysterectomy; Augmentation mammaplasty (Right); and Mastectomy (Left, 2011). Natasha Dennis has a current medication list which includes the following prescription(s): cholecalciferol, ciclopirox, omeprazole, and paroxetine, and the following Facility-Administered  Medications: dexamethasone, iohexol, and ropivacaine (pf) 2 mg/ml (0.2%). Her primarily concern today is the Shoulder Pain (right)  Initial Vital Signs:  Pulse/HCG Rate: 78ECG Heart Rate: 77 Temp: 98 F (36.7 C) Resp: 11 BP: 131/89 SpO2: 99 %  BMI: Estimated body mass index is 34.61 kg/m as calculated from the following:   Height as of this encounter: 5\' 5"  (1.651 m).   Weight as of this encounter: 208 lb (94.3 kg).  Risk Assessment: Allergies: Reviewed. She is allergic to influenza vaccines.  Allergy Precautions: None required Coagulopathies: Reviewed. None identified.  Blood-thinner therapy: None at this time Active Infection(s): Reviewed. None identified. Natasha Dennis is afebrile  Site Confirmation: Natasha Dennis was asked to confirm the procedure and laterality before marking the site Procedure checklist: Completed Consent: Before the procedure and under the influence of no sedative(s), amnesic(s), or anxiolytics, the patient was informed of the treatment options, risks and possible complications. To fulfill our ethical and legal obligations, as recommended by the American Medical Association's Code of Ethics, I have informed the patient of my clinical impression; the nature and purpose of the treatment or procedure; the risks, benefits, and possible complications of the intervention; the alternatives, including doing nothing; the risk(s) and benefit(s) of the alternative treatment(s) or procedure(s); and the risk(s) and benefit(s) of doing nothing. The patient was provided information about the general risks and possible complications associated with the procedure. These may include, but are not limited to: failure to achieve desired goals, infection, bleeding, organ or nerve damage, allergic reactions, paralysis, and death. In addition, the patient was informed of those risks and complications associated to the procedure, such as failure to decrease pain; infection; bleeding; organ or  nerve damage with subsequent damage to sensory, motor, and/or autonomic systems, resulting in permanent pain, numbness, and/or weakness of one  or several areas of the body; allergic reactions; (i.e.: anaphylactic reaction); and/or death. Furthermore, the patient was informed of those risks and complications associated with the medications. These include, but are not limited to: allergic reactions (i.e.: anaphylactic or anaphylactoid reaction(s)); adrenal axis suppression; blood sugar elevation that in diabetics may result in ketoacidosis or comma; water retention that in patients with history of congestive heart failure may result in shortness of breath, pulmonary edema, and decompensation with resultant heart failure; weight gain; swelling or edema; medication-induced neural toxicity; particulate matter embolism and blood vessel occlusion with resultant organ, and/or nervous system infarction; and/or aseptic necrosis of one or more joints. Finally, the patient was informed that Medicine is not an exact science; therefore, there is also the possibility of unforeseen or unpredictable risks and/or possible complications that may result in a catastrophic outcome. The patient indicated having understood very clearly. We have given the patient no guarantees and we have made no promises. Enough time was given to the patient to ask questions, all of which were answered to the patient's satisfaction. Natasha Dennis has indicated that she wanted to continue with the procedure. Attestation: I, the ordering provider, attest that I have discussed with the patient the benefits, risks, side-effects, alternatives, likelihood of achieving goals, and potential problems during recovery for the procedure that I have provided informed consent. Date  Time: 10/15/2018 12:42 PM  Pre-Procedure Preparation:  Monitoring: As per clinic protocol. Respiration, ETCO2, SpO2, BP, heart rate and rhythm monitor placed and checked for adequate  function Safety Precautions: Patient was assessed for positional comfort and pressure points before starting the procedure. Time-out: I initiated and conducted the "Time-out" before starting the procedure, as per protocol. The patient was asked to participate by confirming the accuracy of the "Time Out" information. Verification of the correct person, site, and procedure were performed and confirmed by me, the nursing staff, and the patient. "Time-out" conducted as per Joint Commission's Universal Protocol (UP.01.01.01). Time: 1305  Description of Procedure:          Area Prepped: Entire shoulder Area Prepping solution: DuraPrep (Iodine Povacrylex [0.7% available iodine] and Isopropyl Alcohol, 74% w/w) Safety Precautions: Aspiration looking for blood return was conducted prior to all injections. At no point did we inject any substances, as a needle was being advanced. No attempts were made at seeking any paresthesias. Safe injection practices and needle disposal techniques used. Medications properly checked for expiration dates. SDV (single dose vial) medications used. Description of the Procedure: Protocol guidelines were followed. The patient was placed in position over the procedure table. The target area was identified and the area prepped in the usual manner. Skin & deeper tissues infiltrated with local anesthetic. Appropriate amount of time allowed to pass for local anesthetics to take effect. The procedure needles were then advanced to the target area. Proper needle placement secured. Negative aspiration confirmed. Solution injected in intermittent fashion, asking for systemic symptoms every 0.5cc of injectate. The needles were then removed and the area cleansed, making sure to leave some of the prepping solution back to take advantage of its long term bactericidal properties.    Vitals:   10/15/18 1246 10/15/18 1308 10/15/18 1313  BP: 131/89 137/87 128/88  Pulse: 78    Resp: 11 15 16   Temp:  98 F (36.7 C)    SpO2: 99% 99% 99%  Weight: 208 lb (94.3 kg)    Height: 5\' 5"  (1.651 m)      Start Time: 1305 hrs. End Time: 1312  hrs. Materials:  Needle(s) Type: Spinal Needle Gauge: 25G Length: 3.5-in Medication(s): Please see orders for medications and dosing details. 5 cc solution made of 4 cc of 0.2% ropivacaine, 1 cc of Decadron 10 mg/cc.  5 cc injected intra-articular Imaging Guidance (Non-Spinal):          Type of Imaging Technique: Fluoroscopy Guidance (Non-Spinal) Indication(s): Assistance in needle guidance and placement for procedures requiring needle placement in or near specific anatomical locations not easily accessible without such assistance. Exposure Time: Please see nurses notes. Contrast: Before injecting any contrast, we confirmed that the patient did not have an allergy to iodine, shellfish, or radiological contrast. Once satisfactory needle placement was completed at the desired level, radiological contrast was injected. Contrast injected under live fluoroscopy. No contrast complications. See chart for type and volume of contrast used. Fluoroscopic Guidance: I was personally present during the use of fluoroscopy. "Tunnel Vision Technique" used to obtain the best possible view of the target area. Parallax error corrected before commencing the procedure. "Direction-depth-direction" technique used to introduce the needle under continuous pulsed fluoroscopy. Once target was reached, antero-posterior, oblique, and lateral fluoroscopic projection used confirm needle placement in all planes. Images permanently stored in EMR. Interpretation: I personally interpreted the imaging intraoperatively. Adequate needle placement confirmed in multiple planes. Appropriate spread of contrast into desired area was observed. No evidence of afferent or efferent intravascular uptake. Permanent images saved into the patient's record.  Antibiotic Prophylaxis:   Anti-infectives (From admission,  onward)   None     Indication(s): None identified  Post-operative Assessment:  Post-procedure Vital Signs:  Pulse/HCG Rate: 7876 Temp: 98 F (36.7 C) Resp: 16 BP: 128/88 SpO2: 99 %  EBL: None  Complications: No immediate post-treatment complications observed by team, or reported by patient.  Note: The patient tolerated the entire procedure well. A repeat set of vitals were taken after the procedure and the patient was kept under observation following institutional policy, for this type of procedure. Post-procedural neurological assessment was performed, showing return to baseline, prior to discharge. The patient was provided with post-procedure discharge instructions, including a section on how to identify potential problems. Should any problems arise concerning this procedure, the patient was given instructions to immediately contact us, at any time, without hesitation. In any case, we plan to contact the patient by telephone for a follow-up status report regarding this interventional procedure.  Comments:  No additional relevant information.  Plan of Care  Orders:  Orders Placed This Encounter  Procedures  . [facility] C-ARM 1-60 MIN NO REPORT    Intraoperative interpretation by procedural physician at [facility].    Standing Status:   Standing    Number of Occurrences:   1    Order Specific Question:   Reason for exam:    Answer:   Assistance in needle guidance and placement for procedures requiring needle placement in or near specific anatomical locations not easily accessible without such assistance.  Okay thank you Medications ordered for procedure: Meds ordered this encounter  Medications  . iohexol (OMNIPAQUE) 180 MG/ML injection 10 mL    Must be Myelogram-compatible. If not available, you may substitute with a water-soluble, non-ionic, hypoallergenic, myelogram-compatible radiological contrast medium.  Marland Kitchen lidocaine (XYLOCAINE) 2 % (with pres) injection  400 mg  . ropivacaine (PF) 2 mg/mL (0.2%) (NAROPIN) injection 1 mL  . dexamethasone (DECADRON) injection 10 mg    Medications administered: We administered lidocaine.  See the medical record for exact dosing, route, and time  of administration.  Follow-up plan:   Return if symptoms worsen or fail to improve.         Recent Visits No visits were found meeting these conditions.  Showing recent visits within past 90 days and meeting all other requirements   Today's Visits Date Type Provider Dept  10/15/18 Procedure visit Gillis Santa, MD [facility]  Showing today's visits and meeting all other requirements   Future Appointments No visits were found meeting these conditions.  Showing future appointments within next 90 days and meeting all other requirements   Disposition: Discharge home  Discharge Date & Time: 10/15/2018; 1320 hrs.   Primary Care Physician: Mikey College, NP Location: [facility] Note by: Gillis Santa, MD Date: 10/15/2018; Time: 2:06 PM  Disclaimer:  Medicine is not an exact science. The only guarantee in medicine is that nothing is guaranteed. It is important to note that the decision to proceed with this intervention was based on the information collected from the patient. The Data and conclusions were drawn from the patient's questionnaire, the interview, and the physical examination. Because the information was provided in large part by the patient, it cannot be guaranteed that it has not been purposely or unconsciously manipulated. Every effort has been made to obtain as much relevant data as possible for this evaluation. It is important to note that the conclusions that lead to this procedure are derived in large part from the available data. Always take into account that the treatment will also be dependent on availability of resources and existing treatment guidelines, considered by other Pain Management Practitioners  as being common knowledge and practice, at the time of the intervention. For Medico-Legal purposes, it is also important to point out that variation in procedural techniques and pharmacological choices are the acceptable norm. The indications, contraindications, technique, and results of the above procedure should only be interpreted and judged by a Board-Certified Interventional Pain Specialist with extensive familiarity and expertise in the same exact procedure and technique.

## 2018-10-15 NOTE — Patient Instructions (Signed)

## 2018-10-15 NOTE — Progress Notes (Signed)
Safety precautions to be maintained throughout the outpatient stay will include: orient to surroundings, keep bed in low position, maintain call bell within reach at all times, provide assistance with transfer out of bed and ambulation.  

## 2018-10-16 ENCOUNTER — Telehealth: Payer: Self-pay | Admitting: *Deleted

## 2018-10-16 NOTE — Telephone Encounter (Signed)
Felling better. No complaints.No problems post procedure.

## 2018-10-17 ENCOUNTER — Ambulatory Visit (INDEPENDENT_AMBULATORY_CARE_PROVIDER_SITE_OTHER): Payer: No Typology Code available for payment source | Admitting: Nurse Practitioner

## 2018-10-17 ENCOUNTER — Encounter: Payer: Self-pay | Admitting: Nurse Practitioner

## 2018-10-17 ENCOUNTER — Other Ambulatory Visit: Payer: Self-pay

## 2018-10-17 VITALS — BP 123/71 | HR 66 | Ht 65.0 in | Wt 210.0 lb

## 2018-10-17 DIAGNOSIS — F419 Anxiety disorder, unspecified: Secondary | ICD-10-CM | POA: Diagnosis not present

## 2018-10-17 DIAGNOSIS — K219 Gastro-esophageal reflux disease without esophagitis: Secondary | ICD-10-CM | POA: Diagnosis not present

## 2018-10-17 DIAGNOSIS — Z0000 Encounter for general adult medical examination without abnormal findings: Secondary | ICD-10-CM | POA: Diagnosis not present

## 2018-10-17 DIAGNOSIS — M19019 Primary osteoarthritis, unspecified shoulder: Secondary | ICD-10-CM

## 2018-10-17 DIAGNOSIS — Z23 Encounter for immunization: Secondary | ICD-10-CM | POA: Diagnosis not present

## 2018-10-17 MED ORDER — PAROXETINE HCL 20 MG PO TABS: ORAL_TABLET | ORAL | 0 refills | Status: DC

## 2018-10-17 MED ORDER — OMEPRAZOLE 40 MG PO CPDR: 40.0000 mg | DELAYED_RELEASE_CAPSULE | Freq: Every day | ORAL | 1 refills | Status: DC

## 2018-10-17 NOTE — Patient Instructions (Signed)
Natasha Dennis,   Thank you for coming in to clinic today.  1. Continue work to healthy lifestyle  2. Change omeprazole to 40 mg daily in am.  3. START your taper down from your paroxetine.  If anxiety/depression worsen in next 6-8 weeks, we will need to put you back on the medicatoin.  Please schedule a follow-up appointment with Cassell Smiles, AGNP. No follow-ups on file.  If you have any other questions or concerns, please feel free to call the clinic or send a message through [place]. You may also schedule an earlier appointment if necessary.  You will receive a survey after today's visit either digitally by e-mail or paper by [employer]. Your experiences and feedback matter to [place].  Please respond so we know how we are doing as we provide care for you.   Cassell Smiles, DNP, AGNP-BC Adult Gerontology Nurse Practitioner [place]

## 2018-10-17 NOTE — Progress Notes (Signed)
Subjective:    Patient ID: Natasha Dennis, female    DOB: [birth year], 53 y.o.   MRN: [id number]  Natasha Dennis is a 53 y.o. female presenting on 10/17/2018 for Annual Exam  HPI Annual Physical Exam Patient has been feeling well at this point.  They have no acute concerns today. Sleeps approx 6 hours per night uninterrupted.  Patient chooses to stay up late with early rising for work.  Shoulder pain is improving after injection.  More mobility at this point after 3 days of injection.  HEALTH MAINTENANCE: Weight/BMI: stable Physical activity: stays active at home in yard Diet: regular diet, but trying to be aware of portion sizes and choices of food - Meals out about 2 x per week  - Eating smaller meals due to increased reflux Seatbelt: always Sunscreen: usually PAP: hysterectomy, not for cervical cancer - last exams normal Mammogram: completed 09/2018 DEXA: due to repeat 08/2019 Colon Cancer Screen: completed 2013 HIV/HEP C: neg Optometry: Regular exams Dentistry: Regular  VACCINES: Tetanus: 2017 Influenza: allergy Shingles: recommended - has received 1 dose, due for 2nd dose today  Paxil: now improved coping skills. Patient ready to transition off.    Past Medical History:  Diagnosis Date  . Breast cancer ([place]) 03-17-2009   lt Mastectomy  . Cancer ([place])   . GERD (gastroesophageal reflux disease)    Past Surgical History:  Procedure Laterality Date  . ABDOMINAL HYSTERECTOMY    . AUGMENTATION MAMMAPLASTY Right    RT BREAST IMPLANT ONLY  . MASTECTOMY Left 2011   Social History   Socioeconomic History  . Marital status: Married    Spouse name: Not on file  . Number of children: Not on file  . Years of education: Not on file  . Highest education level: Not on file  Occupational History  . Not on file  Social Needs  . Financial resource strain: Not on file  . Food insecurity    Worry: Not on file    Inability: Not on file  . Transportation needs   Medical: Not on file    Non-medical: Not on file  Tobacco Use  . Smoking status: Former Smoker    Quit date: 03/05/1984    Years since quitting: 34.6  . Smokeless tobacco: Never Used  Substance and Sexual Activity  . Alcohol use: No  . Drug use: No  . Sexual activity: Not on file  Lifestyle  . Physical activity    Days per week: Not on file    Minutes per session: Not on file  . Stress: Not on file  Relationships  . Social Herbalist on phone: Not on file    Gets together: Not on file    Attends religious service: Not on file    Active member of club or organization: Not on file    Attends meetings of clubs or organizations: Not on file    Relationship status: Not on file  . Intimate partner violence    Fear of current or ex partner: Not on file    Emotionally abused: Not on file    Physically abused: Not on file    Forced sexual activity: Not on file  Other Topics Concern  . Not on file  Social History Narrative  . Not on file   Family History  Problem Relation Age of Onset  . Diabetes Mother   . Arthritis Mother   . Heart disease Father   . Breast cancer Neg  Hx    Current Outpatient Medications on File Prior to Visit  Medication Sig  . cholecalciferol (VITAMIN D) 1000 units tablet Take 1,000 Units by mouth daily.  . ciclopirox (PENLAC) 8 % solution Apply topically at bedtime. Apply over nail and skin. Apply daily over previous coat. After seven (7) days, remove with alcohol and continue  . omeprazole (PRILOSEC) 20 MG capsule Take 1 capsule (20 mg total) by mouth 2 (two) times daily before a meal.  . PARoxetine (PAXIL) 20 MG tablet TAKE 1 TABLET (20 MG TOTAL) BY MOUTH DAILY.   No current facility-administered medications on file prior to visit.     Review of Systems  Constitutional: Negative for activity change, appetite change and fatigue.  Respiratory: Negative for cough and shortness of breath.   Cardiovascular: Negative for chest pain, palpitations  and leg swelling.  Gastrointestinal: Negative for constipation, diarrhea, nausea and vomiting.  Genitourinary: Negative for dysuria, frequency and urgency.  Musculoskeletal: Positive for arthralgias (RIGHT shoulder). Negative for myalgias.  Skin: Negative for rash.  Neurological: Negative for dizziness and headaches.  Psychiatric/Behavioral: Positive for sleep disturbance. Negative for dysphoric mood and suicidal ideas. The patient is not nervous/anxious.    Per HPI unless specifically indicated above    Objective:    BP 123/71 (BP Location: Right Arm, Patient Position: Sitting, Cuff Size: Normal)   Pulse 66   Ht 5\' 5"  (1.651 m)   Wt 210 lb (95.3 kg)   BMI 34.95 kg/m   Wt Readings from Last 3 Encounters:  10/17/18 210 lb (95.3 kg)  10/15/18 208 lb (94.3 kg)  04/18/18 211 lb 6.4 oz (95.9 kg)    Physical Exam Vitals signs and nursing note reviewed.  Constitutional:      General: She is not in acute distress.    Appearance: Normal appearance. She is well-developed. She is obese.  HENT:     Head: Normocephalic and atraumatic.     Right Ear: Tympanic membrane, ear canal and external ear normal.     Left Ear: Tympanic membrane, ear canal and external ear normal.     Nose: Nose normal.     Mouth/Throat:     Mouth: Mucous membranes are moist.     Pharynx: Oropharynx is clear.  Eyes:     Conjunctiva/sclera: Conjunctivae normal.     Pupils: Pupils are equal, round, and reactive to light.  Neck:     Musculoskeletal: Normal range of motion and neck supple.     Thyroid: No thyromegaly.     Vascular: No JVD.     Trachea: No tracheal deviation.  Cardiovascular:     Rate and Rhythm: Normal rate and regular rhythm.     Heart sounds: Normal heart sounds. No murmur. No friction rub. No gallop.   Pulmonary:     Effort: Pulmonary effort is normal. No respiratory distress.     Breath sounds: Normal breath sounds.  Abdominal:     General: Bowel sounds are normal. There is no distension.      Palpations: Abdomen is soft.     Tenderness: There is no abdominal tenderness.  Musculoskeletal:     Comments: RIGHT Shoulder Inspection: Normal appearance bilateral symmetrical Palpation: Non-tender to palpation over anterior, lateral, or posterior shoulder  ROM: LIMITED AROM forward flexion, abduction, internal / external rotation Special Testing: Rotator cuff testing positive for weakness with supraspinatus full can and empty can test, O'brien's negative for labral pain, Hawkin's AC impingement negative for pain Strength: Normal strength 5/5 flex/ext, ext  rot / int rot, grip, rotator cuff str testing. Neurovascular: Distally intact pulses, sensation to light touch  Lymphadenopathy:     Cervical: No cervical adenopathy.  Skin:    General: Skin is warm and dry.     Capillary Refill: Capillary refill takes less than 2 seconds.  Neurological:     General: No focal deficit present.     Mental Status: She is alert and oriented to person, place, and time. Mental status is at baseline.     Cranial Nerves: No cranial nerve deficit.  Psychiatric:        Mood and Affect: Mood normal.        Behavior: Behavior normal.        Thought Content: Thought content normal.        Judgment: Judgment normal.    Results for orders placed or performed during the hospital encounter of 02/19/17  VITAMIN D 25 Hydroxy (Vit-D Deficiency, Fractures)  Result Value Ref Range   Vit D, 25-Hydroxy 24.7 (L) 30.0 - 100.0 ng/mL  Lipid panel  Result Value Ref Range   Cholesterol 221 (H) 0 - 200 mg/dL   Triglycerides 96 <150 mg/dL   HDL 60 >40 mg/dL   Total CHOL/HDL Ratio 3.7 RATIO   VLDL 19 0 - 40 mg/dL   LDL Cholesterol 142 (H) 0 - 99 mg/dL  Hemoglobin A1c  Result Value Ref Range   Hgb A1c MFr Bld 5.3 4.8 - 5.6 %   Mean Plasma Glucose 105 mg/dL  TSH  Result Value Ref Range   TSH 1.609 0.350 - 4.500 uIU/mL  Comprehensive metabolic panel  Result Value Ref Range   Sodium 138 135 - 145 mmol/L    Potassium 4.0 3.5 - 5.1 mmol/L   Chloride 105 101 - 111 mmol/L   CO2 25 22 - 32 mmol/L   Glucose, Bld 107 (H) 65 - 99 mg/dL   BUN 17 6 - 20 mg/dL   Creatinine, Ser 0.86 0.44 - 1.00 mg/dL   Calcium 9.4 8.9 - 10.3 mg/dL   Total Protein 7.3 6.5 - 8.1 g/dL   Albumin 4.2 3.5 - 5.0 g/dL   AST 21 15 - 41 U/L   ALT 16 14 - 54 U/L   Alkaline Phosphatase 95 38 - 126 U/L   Total Bilirubin 0.7 0.3 - 1.2 mg/dL   GFR calc non Af Amer >60 >60 mL/min   GFR calc Af Amer >60 >60 mL/min   Anion gap 8 5 - 15  CBC with Differential/Platelet  Result Value Ref Range   WBC 7.5 3.6 - 11.0 K/uL   RBC 5.14 3.80 - 5.20 MIL/uL   Hemoglobin 14.3 12.0 - 16.0 g/dL   HCT 42.8 35.0 - 47.0 %   MCV 83.2 80.0 - 100.0 fL   MCH 27.8 26.0 - 34.0 pg   MCHC 33.4 32.0 - 36.0 g/dL   RDW 14.2 11.5 - 14.5 %   Platelets 198 150 - 440 K/uL   Neutrophils Relative % 65 %   Neutro Abs 4.9 1.4 - 6.5 K/uL   Lymphocytes Relative 26 %   Lymphs Abs 1.9 1.0 - 3.6 K/uL   Monocytes Relative 6 %   Monocytes Absolute 0.5 0.2 - 0.9 K/uL   Eosinophils Relative 2 %   Eosinophils Absolute 0.1 0 - 0.7 K/uL   Basophils Relative 1 %   Basophils Absolute 0.1 0 - 0.1 K/uL      Assessment & Plan:   Problem List Items Addressed This  Visit      Digestive   GERD (gastroesophageal reflux disease)   Relevant Medications   omeprazole (PRILOSEC) 40 MG capsule     Other   Anxiety   Relevant Medications   PARoxetine (PAXIL) 20 MG tablet    Other Visit Diagnoses    Encounter for annual physical exam    -  Primary   Relevant Orders   CBC with Differential/Platelet   Hemoglobin A1c   Lipid panel   Comprehensive Metabolic Panel (CMET)   TSH   Need for shingles vaccine       Relevant Orders   Varicella-zoster vaccine IM (Shingrix) (Completed)     Annual physical exam with new findings of worsening GERD, RIGHT shoulder pain, improved anxiety.  Well adult with no acute concerns.  Plan: 1. Obtain health maintenance screenings as above  according to age. - Increase physical activity to 30 minutes most days of the week.  - Eat healthy diet high in vegetables and fruits; low in refined carbohydrates. - Screening labs and tests as ordered 2. Return 1 year for annual physical.   GERD Uncontrolled and worsening.  Increase omeprazole to 40 mg daily.  Consider repeat GI follow-up. Follow-up in 2-4 weeks if increased PPI is not improving symptoms.  Anxiety Improved with better coping skills.  Stresses persist, but coping is improved.  Patient desires to stop paroxetine.  Taper provided.  Call clinic if anxiety worsens again in next 6-8 weeks or further in future.  In short term, would need to restart paroxetine.  Follow-up prn.  Right shoulder pain Confirmed R shoulder arthritis.  Continue pain management as needed. PATIENT currently within one week s/p corticosteroid injection by Dr. Holley Raring.  Consider physical therapy vs orthopedics for future treatment.  Follow-up prn.  Meds ordered this encounter  Medications  . PARoxetine (PAXIL) 20 MG tablet    Sig: Take 1/2 tablet daily for 2 weeks.  Then take 1/2 tablet every other day for 2 weeks.  Then, stop.    Dispense:  30 tablet    Refill:  0    Order Specific Question:   Supervising Provider    Answer:   Olin Hauser [[id number]]   Follow up plan: Return in about 1 year (around 10/17/2019) for annual physical.  Cassell Smiles, DNP, AGPCNP-BC Adult Gerontology Primary Care Nurse Practitioner [facility] 10/17/2018, 1:42 PM

## 2018-10-21 ENCOUNTER — Other Ambulatory Visit: Payer: Self-pay | Admitting: Nurse Practitioner

## 2018-10-21 DIAGNOSIS — D696 Thrombocytopenia, unspecified: Secondary | ICD-10-CM

## 2018-10-22 LAB — CBC WITH DIFFERENTIAL/PLATELET
Basophils Absolute: 0.1 10*3/uL (ref 0.0–0.2)
Basos: 1 %
EOS (ABSOLUTE): 0.2 10*3/uL (ref 0.0–0.4)
Eos: 2 %
Hematocrit: 40.9 % (ref 34.0–46.6)
Hemoglobin: 13.8 g/dL (ref 11.1–15.9)
Immature Grans (Abs): 0 10*3/uL (ref 0.0–0.1)
Immature Granulocytes: 0 %
Lymphocytes Absolute: 2.2 10*3/uL (ref 0.7–3.1)
Lymphs: 28 %
MCH: 27.5 pg (ref 26.6–33.0)
MCHC: 33.7 g/dL (ref 31.5–35.7)
MCV: 82 fL (ref 79–97)
Monocytes Absolute: 0.5 10*3/uL (ref 0.1–0.9)
Monocytes: 6 %
Neutrophils Absolute: 5 10*3/uL (ref 1.4–7.0)
Neutrophils: 63 %
Platelets: 97 10*3/uL — CL (ref 150–450)
RBC: 5.02 x10E6/uL (ref 3.77–5.28)
RDW: 14.5 % (ref 11.7–15.4)
WBC: 8 10*3/uL (ref 3.4–10.8)

## 2018-10-22 LAB — COMPREHENSIVE METABOLIC PANEL
ALT: 13 IU/L (ref 0–32)
AST: 20 IU/L (ref 0–40)
Albumin/Globulin Ratio: 1.9 (ref 1.2–2.2)
Albumin: 4.2 g/dL (ref 3.8–4.9)
Alkaline Phosphatase: 124 IU/L — ABNORMAL HIGH (ref 39–117)
BUN/Creatinine Ratio: 16 (ref 9–23)
BUN: 14 mg/dL (ref 6–24)
Bilirubin Total: 0.4 mg/dL (ref 0.0–1.2)
CO2: 22 mmol/L (ref 20–29)
Calcium: 9.2 mg/dL (ref 8.7–10.2)
Chloride: 104 mmol/L (ref 96–106)
Creatinine, Ser: 0.86 mg/dL (ref 0.57–1.00)
GFR calc Af Amer: 89 mL/min/{1.73_m2} (ref >59)
GFR calc non Af Amer: 77 mL/min/{1.73_m2} (ref >59)
Globulin, Total: 2.2 g/dL (ref 1.5–4.5)
Glucose: 93 mg/dL (ref 65–99)
Potassium: 4.6 mmol/L (ref 3.5–5.2)
Sodium: 141 mmol/L (ref 134–144)
Total Protein: 6.4 g/dL (ref 6.0–8.5)

## 2018-10-22 LAB — LIPID PANEL W/O CHOL/HDL RATIO
Cholesterol, Total: 195 mg/dL (ref 100–199)
HDL: 52 mg/dL (ref >39)
LDL Calculated: 126 mg/dL — ABNORMAL HIGH (ref 0–99)
Triglycerides: 87 mg/dL (ref 0–149)
VLDL Cholesterol Cal: 17 mg/dL (ref 5–40)

## 2018-10-22 LAB — HGB A1C W/O EAG: Hgb A1c MFr Bld: 5.2 % (ref 4.8–5.6)

## 2018-10-22 LAB — TSH: TSH: 0.959 u[IU]/mL (ref 0.450–4.500)

## 2018-10-23 ENCOUNTER — Encounter: Payer: Self-pay | Admitting: Nurse Practitioner

## 2018-10-24 NOTE — Addendum Note (Signed)
Addended by: Cleaster Corin on: 10/24/2018 04:37 PM   Modules accepted: Orders

## 2018-11-04 ENCOUNTER — Ambulatory Visit: Payer: No Typology Code available for payment source | Admitting: Pain Medicine

## 2018-11-24 ENCOUNTER — Encounter: Payer: Self-pay | Admitting: Nurse Practitioner

## 2018-11-24 DIAGNOSIS — D696 Thrombocytopenia, unspecified: Secondary | ICD-10-CM

## 2018-12-02 ENCOUNTER — Other Ambulatory Visit: Payer: Self-pay | Admitting: Nurse Practitioner

## 2018-12-03 LAB — CBC WITH DIFFERENTIAL/PLATELET
Basophils Absolute: 0.1 10*3/uL (ref 0.0–0.2)
Basos: 1 %
EOS (ABSOLUTE): 0.2 10*3/uL (ref 0.0–0.4)
Eos: 2 %
Hematocrit: 39.9 % (ref 34.0–46.6)
Hemoglobin: 13.2 g/dL (ref 11.1–15.9)
Immature Grans (Abs): 0 10*3/uL (ref 0.0–0.1)
Immature Granulocytes: 0 %
Lymphocytes Absolute: 3.1 10*3/uL (ref 0.7–3.1)
Lymphs: 34 %
MCH: 27.4 pg (ref 26.6–33.0)
MCHC: 33.1 g/dL (ref 31.5–35.7)
MCV: 83 fL (ref 79–97)
Monocytes Absolute: 0.5 10*3/uL (ref 0.1–0.9)
Monocytes: 6 %
Neutrophils Absolute: 5.1 10*3/uL (ref 1.4–7.0)
Neutrophils: 57 %
Platelets: 99 10*3/uL — CL (ref 150–450)
RBC: 4.82 x10E6/uL (ref 3.77–5.28)
RDW: 14.8 % (ref 11.7–15.4)
WBC: 9 10*3/uL (ref 3.4–10.8)

## 2018-12-04 ENCOUNTER — Encounter: Payer: Self-pay | Admitting: Nurse Practitioner

## 2018-12-08 ENCOUNTER — Encounter: Payer: Self-pay | Admitting: Nurse Practitioner

## 2018-12-08 DIAGNOSIS — D696 Thrombocytopenia, unspecified: Secondary | ICD-10-CM

## 2018-12-18 ENCOUNTER — Other Ambulatory Visit: Payer: Self-pay

## 2018-12-19 ENCOUNTER — Inpatient Hospital Stay: Payer: No Typology Code available for payment source | Attending: Internal Medicine | Admitting: Internal Medicine

## 2018-12-19 ENCOUNTER — Other Ambulatory Visit: Payer: Self-pay

## 2018-12-19 ENCOUNTER — Telehealth: Payer: Self-pay | Admitting: *Deleted

## 2018-12-19 ENCOUNTER — Inpatient Hospital Stay: Payer: No Typology Code available for payment source

## 2018-12-19 DIAGNOSIS — Z9012 Acquired absence of left breast and nipple: Secondary | ICD-10-CM | POA: Insufficient documentation

## 2018-12-19 DIAGNOSIS — Z90710 Acquired absence of both cervix and uterus: Secondary | ICD-10-CM | POA: Diagnosis not present

## 2018-12-19 DIAGNOSIS — Z87891 Personal history of nicotine dependence: Secondary | ICD-10-CM | POA: Diagnosis not present

## 2018-12-19 DIAGNOSIS — Z9079 Acquired absence of other genital organ(s): Secondary | ICD-10-CM | POA: Diagnosis not present

## 2018-12-19 DIAGNOSIS — Z803 Family history of malignant neoplasm of breast: Secondary | ICD-10-CM | POA: Diagnosis not present

## 2018-12-19 DIAGNOSIS — Z853 Personal history of malignant neoplasm of breast: Secondary | ICD-10-CM | POA: Insufficient documentation

## 2018-12-19 DIAGNOSIS — Z79899 Other long term (current) drug therapy: Secondary | ICD-10-CM | POA: Diagnosis not present

## 2018-12-19 DIAGNOSIS — D696 Thrombocytopenia, unspecified: Secondary | ICD-10-CM | POA: Diagnosis not present

## 2018-12-19 DIAGNOSIS — Z9221 Personal history of antineoplastic chemotherapy: Secondary | ICD-10-CM | POA: Insufficient documentation

## 2018-12-19 DIAGNOSIS — Z90722 Acquired absence of ovaries, bilateral: Secondary | ICD-10-CM | POA: Insufficient documentation

## 2018-12-19 DIAGNOSIS — Z9882 Breast implant status: Secondary | ICD-10-CM | POA: Insufficient documentation

## 2018-12-19 DIAGNOSIS — Z170 Estrogen receptor positive status [ER+]: Secondary | ICD-10-CM | POA: Insufficient documentation

## 2018-12-19 LAB — CBC WITH DIFFERENTIAL/PLATELET
Abs Immature Granulocytes: 0.04 10*3/uL (ref 0.00–0.07)
Basophils Absolute: 0.1 10*3/uL (ref 0.0–0.1)
Basophils Relative: 1 %
Eosinophils Absolute: 0.1 10*3/uL (ref 0.0–0.5)
Eosinophils Relative: 1 %
HCT: 39.9 % (ref 36.0–46.0)
Hemoglobin: 13 g/dL (ref 12.0–15.0)
Immature Granulocytes: 0 %
Lymphocytes Relative: 30 %
Lymphs Abs: 3 10*3/uL (ref 0.7–4.0)
MCH: 26.5 pg (ref 26.0–34.0)
MCHC: 32.6 g/dL (ref 30.0–36.0)
MCV: 81.3 fL (ref 80.0–100.0)
Monocytes Absolute: 0.6 10*3/uL (ref 0.1–1.0)
Monocytes Relative: 6 %
Neutro Abs: 6 10*3/uL (ref 1.7–7.7)
Neutrophils Relative %: 62 %
Platelets: 95 10*3/uL — ABNORMAL LOW (ref 150–400)
RBC: 4.91 MIL/uL (ref 3.87–5.11)
RDW: 14.6 % (ref 11.5–15.5)
WBC: 9.7 10*3/uL (ref 4.0–10.5)
nRBC: 0 % (ref 0.0–0.2)

## 2018-12-19 LAB — TECHNOLOGIST SMEAR REVIEW: Plt Morphology: DECREASED

## 2018-12-19 LAB — LACTATE DEHYDROGENASE: LDH: 137 U/L (ref 98–192)

## 2018-12-19 LAB — HIV ANTIBODY (ROUTINE TESTING W REFLEX): HIV Screen 4th Generation wRfx: NONREACTIVE

## 2018-12-19 LAB — HEPATITIS B SURFACE ANTIGEN: Hepatitis B Surface Ag: NONREACTIVE

## 2018-12-19 LAB — HEPATITIS C ANTIBODY: HCV Ab: NONREACTIVE

## 2018-12-19 NOTE — Telephone Encounter (Signed)
-----   Message from Cammie Sickle, MD sent at 12/19/2018  1:05 PM EDT ----- Natasha Dennis/Colette please schedule lab-CBC every 2 weeks x3; follow-up with me in 6 weeks-labs CBC. Thanks  FYI-lauren/Jenny.  ........................................................................................................ Hello-I wanted to inform you that-platelets are 95; which overall are holding steady/not significantly different.  Rest of the labs are still pending.  I would recommend repeating the labs every 2 weeks for now/monitor closely.  Follow-up in 6 weeks with me.   No new recommendations/follow-up as planned/call if any questions.  Thank you/Dr. B/MyChart message

## 2018-12-19 NOTE — Assessment & Plan Note (Addendum)
#  Isolated thrombocytopenia- 97-99 [OCT 2020]; normal counts in 2018.  I had a  long discussion with the patient regarding the multiple causes of thrombocytopenia including but not limited to medications liver disease/splenomegaly infections; immune related thrombocytopenia; less likely malignant causes.  Clinically suspect viral infection/ITP.   # Recommend checking CBC LDH hepatitis/HIV. Check peripheral smear. Hepatitis workup. S56 and folic acid.  Hold off ultrasound abdomen at this time.  If no clear etiology found- bone marrow biopsy could be considered however less likely to be recommended clinically more suggestive of immune mediated thrombocytopenia.  #History of breast cancer ER/PR positive  [as per patient]. II lobular s/p mastec s/p TRAM; no RT s/p Chemo; no RT. s/p aromatase negative.  No clinical recurrence.  Thank you, Ms.Kennedy for allowing me to participate in the care of your pleasant patient. Please do not hesitate to contact me with questions or concerns in the interim.  # DISPOSITION:will call with results # labs- ordered today  # follow up TBD- Dr.B

## 2018-12-19 NOTE — Progress Notes (Signed)
[facility] NOTE  Patient Care Team: Mikey College, NP as PCP - General (Nurse Practitioner)  CHIEF COMPLAINTS/PURPOSE OF CONSULTATION: Thrombocytopenia  Oncology History  Cancer of left breast ([place])  07/05/2008 Initial Diagnosis   History is obtained through chart review from Dr. Metro Kung notes. She was originally diagnosed with breast cancer, invasive lobular carcinoma in May 2010. She was placed on the protocol and received neoadjuvant chemotherapy. She had left mastectomy with reconstruction surgery and right breast augmentation with implant. Subsequently she was placed on tamoxifen until July of 2012, then Femara 2.5 mg.  Femara was stopped because of bony pains July of 2013. She was then switched to Aromasin. Patient has stopped taking Aromasin because of perceived side effect. In total, she had received anti-estrogen therapy for almost 4 years   10/17/2010 Surgery   She had bilateral salpingo-oophorectomy   01/21/2016 Imaging   No MRI evidence of malignancy in the right breast or reconstructed left breast (TRAM flap).       HEMATOLOGY HISTORY  # THROMBOCYTOPENIA [platelets- 2020 sep 97-99; 2018-N]; WBC/ Hb-N  #History of breast cancer stage II ER/PR positive [2010;]  HISTORY OF PRESENTING ILLNESS:  Natasha Dennis 53 y.o.  female pleasant patient was been referred to [place] for further evaluation of thrombocytopenia.  Denies any increased bruising or gum bleeding or nosebleeds.  Denies liver problems.  Denies any medications.  Denies any alcohol.  Denies any viral infections.  Denies any Covid exposure  I reviewed the patient's labs from PCP/summarized as above   Review of Systems  Constitutional: Negative for chills, diaphoresis, fever, malaise/fatigue and weight loss.  HENT: Negative for nosebleeds and sore throat.   Eyes: Negative for double vision.  Respiratory: Negative for cough, hemoptysis, sputum production, shortness of breath and  wheezing.   Cardiovascular: Negative for chest pain, palpitations, orthopnea and leg swelling.  Gastrointestinal: Negative for abdominal pain, blood in stool, constipation, diarrhea, heartburn, melena, nausea and vomiting.  Genitourinary: Negative for dysuria, frequency and urgency.  Musculoskeletal: Negative for back pain and joint pain.  Skin: Negative.  Negative for itching and rash.  Neurological: Negative for dizziness, tingling, focal weakness, weakness and headaches.  Endo/Heme/Allergies: Does not bruise/bleed easily.  Psychiatric/Behavioral: Negative for depression. The patient is not nervous/anxious and does not have insomnia.      MEDICAL HISTORY:  Past Medical History:  Diagnosis Date  . Breast cancer ([place]) 03-17-2009   lt Mastectomy  . Cancer ([place])   . GERD (gastroesophageal reflux disease)     SURGICAL HISTORY: Past Surgical History:  Procedure Laterality Date  . ABDOMINAL HYSTERECTOMY    . AUGMENTATION MAMMAPLASTY Right    RT BREAST IMPLANT ONLY  . MASTECTOMY Left 2011    SOCIAL HISTORY: Social History   Socioeconomic History  . Marital status: Married    Spouse name: Not on file  . Number of children: Not on file  . Years of education: Not on file  . Highest education level: Not on file  Occupational History  . Not on file  Social Needs  . Financial resource strain: Not on file  . Food insecurity    Worry: Not on file    Inability: Not on file  . Transportation needs    Medical: Not on file    Non-medical: Not on file  Tobacco Use  . Smoking status: Former Smoker    Quit date: 03/05/1984    Years since quitting: 34.8  . Smokeless tobacco: Never Used  Substance  and Sexual Activity  . Alcohol use: No  . Drug use: No  . Sexual activity: Not on file  Lifestyle  . Physical activity    Days per week: Not on file    Minutes per session: Not on file  . Stress: Not on file  Relationships  . Social Herbalist on phone: Not on file    Gets  together: Not on file    Attends religious service: Not on file    Active member of club or organization: Not on file    Attends meetings of clubs or organizations: Not on file    Relationship status: Not on file  . Intimate partner violence    Fear of current or ex partner: Not on file    Emotionally abused: Not on file    Physically abused: Not on file    Forced sexual activity: Not on file  Other Topics Concern  . Not on file  Social History Narrative  . Not on file    FAMILY HISTORY: Family History  Problem Relation Age of Onset  . Diabetes Mother   . Arthritis Mother   . Heart disease Father   . Breast cancer Neg Hx     ALLERGIES:  is allergic to influenza vaccines.  MEDICATIONS:  Current Outpatient Medications  Medication Sig Dispense Refill  . cholecalciferol (VITAMIN D) 1000 units tablet Take 1,000 Units by mouth daily.    . ciclopirox (PENLAC) 8 % solution Apply topically at bedtime. Apply over nail and skin. Apply daily over previous coat. After seven (7) days, remove with alcohol and continue 6.6 mL 0  . omeprazole (PRILOSEC) 40 MG capsule Take 1 capsule (40 mg total) by mouth daily. 90 capsule 1  . PARoxetine (PAXIL) 20 MG tablet Take 1/2 tablet daily for 2 weeks.  Then take 1/2 tablet every other day for 2 weeks.  Then, stop. (Patient not taking: Reported on 12/19/2018) 30 tablet 0   No current facility-administered medications for this visit.      Marland Kitchen  PHYSICAL EXAMINATION:   Vitals:   12/19/18 1132  BP: (!) 145/80  Pulse: 75  Resp: 18  Temp: 99.4 F (37.4 C)   Filed Weights   12/19/18 1132  Weight: 209 lb 6.4 oz (95 kg)    Physical Exam  Constitutional: She is oriented to person, place, and time and well-developed, well-nourished, and in no distress.  HENT:  Head: Normocephalic and atraumatic.  Mouth/Throat: Oropharynx is clear and moist. No oropharyngeal exudate.  Eyes: Pupils are equal, round, and reactive to light.  Neck: Normal range of  motion. Neck supple.  Cardiovascular: Normal rate and regular rhythm.  Pulmonary/Chest: Effort normal and breath sounds normal. No respiratory distress. She has no wheezes.  Abdominal: Soft. Bowel sounds are normal. She exhibits no distension and no mass. There is no abdominal tenderness. There is no rebound and no guarding.  Musculoskeletal: Normal range of motion.        General: No tenderness or edema.  Neurological: She is alert and oriented to person, place, and time.  Skin: Skin is warm.  Psychiatric: Affect normal.     LABORATORY DATA:  I have reviewed the data as listed Lab Results  Component Value Date   WBC 9.0 12/02/2018   HGB 13.2 12/02/2018   HCT 39.9 12/02/2018   MCV 83 12/02/2018   PLT 99 (LL) 12/02/2018   Recent Labs    10/21/18 1613  NA 141  K 4.6  CL 104  CO2 22  GLUCOSE 93  BUN 14  CREATININE 0.86  CALCIUM 9.2  GFRNONAA 77  GFRAA 89  PROT 6.4  ALBUMIN 4.2  AST 20  ALT 13  ALKPHOS 124*  BILITOT 0.4     No results found.  ASSESSMENT & PLAN:   Thrombocytopenia ([place]) #Isolated thrombocytopenia- 97-99 [OCT 2020]; normal counts in 2018.  I had a  long discussion with the patient regarding the multiple causes of thrombocytopenia including but not limited to medications liver disease/splenomegaly infections; immune related thrombocytopenia; less likely malignant causes.  Clinically suspect viral infection/ITP.   # Recommend checking CBC LDH hepatitis/HIV. Check peripheral smear. Hepatitis workup. S51 and folic acid.  Hold off ultrasound abdomen at this time.  If no clear etiology found- bone marrow biopsy could be considered however less likely to be recommended clinically more suggestive of immune mediated thrombocytopenia.  #History of breast cancer ER/PR positive  [as per patient]. II lobular s/p mastec s/p TRAM; no RT s/p Chemo; no RT. s/p aromatase negative.  No clinical recurrence.  Thank you, Ms.Kennedy for allowing me to participate in the care  of your pleasant patient. Please do not hesitate to contact me with questions or concerns in the interim.  # DISPOSITION:will call with results # labs- ordered today  # follow up TBD- Dr.B  All questions were answered. The patient knows to call the clinic with any problems, questions or concerns.  Thank you Dr. for allowing me to participate in the care of your pleasant patient. Please do not hesitate to contact me with questions or concerns in the interim.   Cammie Sickle, MD 12/19/2018 12:17 PM

## 2018-12-29 ENCOUNTER — Encounter: Payer: Self-pay | Admitting: Nurse Practitioner

## 2019-01-02 ENCOUNTER — Inpatient Hospital Stay: Payer: No Typology Code available for payment source

## 2019-01-05 ENCOUNTER — Inpatient Hospital Stay: Payer: No Typology Code available for payment source | Attending: Internal Medicine

## 2019-01-05 ENCOUNTER — Other Ambulatory Visit: Payer: Self-pay

## 2019-01-05 DIAGNOSIS — Z833 Family history of diabetes mellitus: Secondary | ICD-10-CM | POA: Diagnosis not present

## 2019-01-05 DIAGNOSIS — Z853 Personal history of malignant neoplasm of breast: Secondary | ICD-10-CM | POA: Diagnosis not present

## 2019-01-05 DIAGNOSIS — Z8249 Family history of ischemic heart disease and other diseases of the circulatory system: Secondary | ICD-10-CM | POA: Insufficient documentation

## 2019-01-05 DIAGNOSIS — Z90710 Acquired absence of both cervix and uterus: Secondary | ICD-10-CM | POA: Diagnosis not present

## 2019-01-05 DIAGNOSIS — Z9079 Acquired absence of other genital organ(s): Secondary | ICD-10-CM | POA: Insufficient documentation

## 2019-01-05 DIAGNOSIS — D696 Thrombocytopenia, unspecified: Secondary | ICD-10-CM | POA: Diagnosis not present

## 2019-01-05 DIAGNOSIS — Z9882 Breast implant status: Secondary | ICD-10-CM | POA: Diagnosis not present

## 2019-01-05 DIAGNOSIS — Z9012 Acquired absence of left breast and nipple: Secondary | ICD-10-CM | POA: Insufficient documentation

## 2019-01-05 DIAGNOSIS — Z79899 Other long term (current) drug therapy: Secondary | ICD-10-CM | POA: Insufficient documentation

## 2019-01-05 DIAGNOSIS — Z87891 Personal history of nicotine dependence: Secondary | ICD-10-CM | POA: Insufficient documentation

## 2019-01-05 DIAGNOSIS — Z90722 Acquired absence of ovaries, bilateral: Secondary | ICD-10-CM | POA: Diagnosis not present

## 2019-01-05 DIAGNOSIS — F418 Other specified anxiety disorders: Secondary | ICD-10-CM | POA: Insufficient documentation

## 2019-01-05 DIAGNOSIS — Z170 Estrogen receptor positive status [ER+]: Secondary | ICD-10-CM | POA: Diagnosis not present

## 2019-01-05 LAB — CBC WITH DIFFERENTIAL/PLATELET
Abs Immature Granulocytes: 0.04 10*3/uL (ref 0.00–0.07)
Basophils Absolute: 0.1 10*3/uL (ref 0.0–0.1)
Basophils Relative: 1 %
Eosinophils Absolute: 0.1 10*3/uL (ref 0.0–0.5)
Eosinophils Relative: 1 %
HCT: 38.8 % (ref 36.0–46.0)
Hemoglobin: 12.6 g/dL (ref 12.0–15.0)
Immature Granulocytes: 0 %
Lymphocytes Relative: 29 %
Lymphs Abs: 3 10*3/uL (ref 0.7–4.0)
MCH: 26.6 pg (ref 26.0–34.0)
MCHC: 32.5 g/dL (ref 30.0–36.0)
MCV: 82 fL (ref 80.0–100.0)
Monocytes Absolute: 0.6 10*3/uL (ref 0.1–1.0)
Monocytes Relative: 6 %
Neutro Abs: 6.6 10*3/uL (ref 1.7–7.7)
Neutrophils Relative %: 63 %
Platelets: 101 10*3/uL — ABNORMAL LOW (ref 150–400)
RBC: 4.73 MIL/uL (ref 3.87–5.11)
RDW: 15.1 % (ref 11.5–15.5)
WBC: 10.4 10*3/uL (ref 4.0–10.5)
nRBC: 0 % (ref 0.0–0.2)

## 2019-01-07 ENCOUNTER — Encounter: Payer: Self-pay | Admitting: Internal Medicine

## 2019-01-15 ENCOUNTER — Other Ambulatory Visit: Payer: Self-pay

## 2019-01-16 ENCOUNTER — Inpatient Hospital Stay: Payer: No Typology Code available for payment source

## 2019-01-16 ENCOUNTER — Other Ambulatory Visit: Payer: Self-pay

## 2019-01-16 DIAGNOSIS — D696 Thrombocytopenia, unspecified: Secondary | ICD-10-CM | POA: Diagnosis not present

## 2019-01-16 LAB — CBC WITH DIFFERENTIAL/PLATELET
Abs Immature Granulocytes: 0.04 10*3/uL (ref 0.00–0.07)
Basophils Absolute: 0.1 10*3/uL (ref 0.0–0.1)
Basophils Relative: 1 %
Eosinophils Absolute: 0.1 10*3/uL (ref 0.0–0.5)
Eosinophils Relative: 1 %
HCT: 38.2 % (ref 36.0–46.0)
Hemoglobin: 12.2 g/dL (ref 12.0–15.0)
Immature Granulocytes: 0 %
Lymphocytes Relative: 31 %
Lymphs Abs: 3.1 10*3/uL (ref 0.7–4.0)
MCH: 26.2 pg (ref 26.0–34.0)
MCHC: 31.9 g/dL (ref 30.0–36.0)
MCV: 82.2 fL (ref 80.0–100.0)
Monocytes Absolute: 0.5 10*3/uL (ref 0.1–1.0)
Monocytes Relative: 5 %
Neutro Abs: 6.3 10*3/uL (ref 1.7–7.7)
Neutrophils Relative %: 62 %
Platelets: 94 10*3/uL — ABNORMAL LOW (ref 150–400)
RBC: 4.65 MIL/uL (ref 3.87–5.11)
RDW: 15 % (ref 11.5–15.5)
WBC: 10.1 10*3/uL (ref 4.0–10.5)
nRBC: 0 % (ref 0.0–0.2)

## 2019-01-27 ENCOUNTER — Telehealth: Payer: Self-pay

## 2019-01-27 NOTE — Progress Notes (Signed)
Called patient no answer left message  

## 2019-01-27 NOTE — Telephone Encounter (Signed)
The patient called very upset and crying she requesting a low dose of Xanax to help her get through her recent tragedy. Her son just recently passed away unexpectedly and she had to bury him on yesterday. She state that she is very sensitive to medication and requesting that you call in the lowest dose. Please advise

## 2019-01-27 NOTE — Telephone Encounter (Signed)
Scheduled virtual visit for tomorrow 11/25  Natasha Dennis, [facility] 01/27/2019, 4:24 PM

## 2019-01-28 ENCOUNTER — Ambulatory Visit (INDEPENDENT_AMBULATORY_CARE_PROVIDER_SITE_OTHER): Payer: No Typology Code available for payment source | Admitting: Family Medicine

## 2019-01-28 ENCOUNTER — Other Ambulatory Visit: Payer: Self-pay

## 2019-01-28 ENCOUNTER — Inpatient Hospital Stay: Payer: No Typology Code available for payment source

## 2019-01-28 ENCOUNTER — Inpatient Hospital Stay (HOSPITAL_BASED_OUTPATIENT_CLINIC_OR_DEPARTMENT_OTHER): Payer: No Typology Code available for payment source | Admitting: Internal Medicine

## 2019-01-28 ENCOUNTER — Encounter: Payer: Self-pay | Admitting: Family Medicine

## 2019-01-28 VITALS — BP 138/80

## 2019-01-28 DIAGNOSIS — D696 Thrombocytopenia, unspecified: Secondary | ICD-10-CM

## 2019-01-28 DIAGNOSIS — F4320 Adjustment disorder, unspecified: Secondary | ICD-10-CM

## 2019-01-28 DIAGNOSIS — Z634 Disappearance and death of family member: Secondary | ICD-10-CM | POA: Diagnosis not present

## 2019-01-28 DIAGNOSIS — F4322 Adjustment disorder with anxiety: Secondary | ICD-10-CM | POA: Diagnosis not present

## 2019-01-28 DIAGNOSIS — F5104 Psychophysiologic insomnia: Secondary | ICD-10-CM

## 2019-01-28 LAB — CBC WITH DIFFERENTIAL/PLATELET
Abs Immature Granulocytes: 0.03 10*3/uL (ref 0.00–0.07)
Basophils Absolute: 0.1 10*3/uL (ref 0.0–0.1)
Basophils Relative: 1 %
Eosinophils Absolute: 0.1 10*3/uL (ref 0.0–0.5)
Eosinophils Relative: 1 %
HCT: 38.3 % (ref 36.0–46.0)
Hemoglobin: 12.2 g/dL (ref 12.0–15.0)
Immature Granulocytes: 0 %
Lymphocytes Relative: 29 %
Lymphs Abs: 3 10*3/uL (ref 0.7–4.0)
MCH: 26.1 pg (ref 26.0–34.0)
MCHC: 31.9 g/dL (ref 30.0–36.0)
MCV: 82 fL (ref 80.0–100.0)
Monocytes Absolute: 0.6 10*3/uL (ref 0.1–1.0)
Monocytes Relative: 6 %
Neutro Abs: 6.5 10*3/uL (ref 1.7–7.7)
Neutrophils Relative %: 63 %
Platelets: 104 10*3/uL — ABNORMAL LOW (ref 150–400)
RBC: 4.67 MIL/uL (ref 3.87–5.11)
RDW: 15.3 % (ref 11.5–15.5)
WBC: 10.4 10*3/uL (ref 4.0–10.5)
nRBC: 0 % (ref 0.0–0.2)

## 2019-01-28 MED ORDER — ALPRAZOLAM 0.5 MG PO TABS: 0.2500 mg | ORAL_TABLET | Freq: Every evening | ORAL | 0 refills | Status: DC | PRN

## 2019-01-28 NOTE — Patient Instructions (Addendum)
I am sorry for your loss  Try the Alprazolam 0.5mg  dose half or whole pill nightly as needed for short term relief  If you decide you want to restart paxil or other medication anti depressant such as lexapro let us know, otherwise we can consider other sleep medicines and therapist referral  Also - you can look into [facility] or [facility] - most locations of hospice will provide a free grief counseling service if you are interested.   Please schedule a Follow-up Appointment to: Return in about 4 weeks (around 02/25/2019), or if symptoms worsen or fail to improve, for anxiety.  If you have any other questions or concerns, please feel free to call the office or send a message through [facility]. You may also schedule an earlier appointment if necessary.  Additionally, you may be receiving a survey about your experience at our office within a few days to 1 week by e-mail or mail. We value your feedback.  Nobie Putnam, DO [facility]

## 2019-01-28 NOTE — Progress Notes (Signed)
[facility] NOTE  Patient Care Team: Mikey College, NP (Inactive) as PCP - General (Nurse Practitioner)  CHIEF COMPLAINTS/PURPOSE OF CONSULTATION: Thrombocytopenia  Oncology History  Cancer of left breast ([place])  07/05/2008 Initial Diagnosis   History is obtained through chart review from Dr. Metro Kung notes. She was originally diagnosed with breast cancer, invasive lobular carcinoma in May 2010. She was placed on the protocol and received neoadjuvant chemotherapy. She had left mastectomy with reconstruction surgery and right breast augmentation with implant. Subsequently she was placed on tamoxifen until July of 2012, then Femara 2.5 mg.  Femara was stopped because of bony pains July of 2013. She was then switched to Aromasin. Patient has stopped taking Aromasin because of perceived side effect. In total, she had received anti-estrogen therapy for almost 4 years   10/17/2010 Surgery   She had bilateral salpingo-oophorectomy   01/21/2016 Imaging   No MRI evidence of malignancy in the right breast or reconstructed left breast (TRAM flap).       HEMATOLOGY HISTORY  # THROMBOCYTOPENIA- likely ITP [platelets- 2020 sep 97-99; 2018-N]; WBC/ Hb-N; OCT 2020- HIV/HEP-NEG; No BMB.   #History of breast cancer stage II ER/PR positive [2010;]  HISTORY OF PRESENTING ILLNESS:  Natasha Dennis 53 y.o.  female pleasant patient is here for follow-up of thrombocytopenia.  In the interim patient lost her son of 46 years of age to fire accident.  She is quite distraught.  Patient denies any worsening gum disease or nosebleeds.    Review of Systems  Constitutional: Negative for chills, diaphoresis, fever, malaise/fatigue and weight loss.  HENT: Negative for nosebleeds and sore throat.   Eyes: Negative for double vision.  Respiratory: Negative for cough, hemoptysis, sputum production, shortness of breath and wheezing.   Cardiovascular: Negative for chest pain,  palpitations, orthopnea and leg swelling.  Gastrointestinal: Negative for abdominal pain, blood in stool, constipation, diarrhea, heartburn, melena, nausea and vomiting.  Genitourinary: Negative for dysuria, frequency and urgency.  Musculoskeletal: Negative for back pain and joint pain.  Skin: Negative.  Negative for itching and rash.  Neurological: Negative for dizziness, tingling, focal weakness, weakness and headaches.  Endo/Heme/Allergies: Does not bruise/bleed easily.  Psychiatric/Behavioral: Negative for depression. The patient is not nervous/anxious and does not have insomnia.      MEDICAL HISTORY:  Past Medical History:  Diagnosis Date  . Breast cancer ([place]) 03-17-2009   lt Mastectomy  . Cancer ([place])   . GERD (gastroesophageal reflux disease)     SURGICAL HISTORY: Past Surgical History:  Procedure Laterality Date  . ABDOMINAL HYSTERECTOMY    . AUGMENTATION MAMMAPLASTY Right    RT BREAST IMPLANT ONLY  . MASTECTOMY Left 2011    SOCIAL HISTORY: Social History   Socioeconomic History  . Marital status: Married    Spouse name: Not on file  . Number of children: Not on file  . Years of education: Not on file  . Highest education level: Not on file  Occupational History  . Not on file  Social Needs  . Financial resource strain: Not on file  . Food insecurity    Worry: Not on file    Inability: Not on file  . Transportation needs    Medical: Not on file    Non-medical: Not on file  Tobacco Use  . Smoking status: Former Smoker    Quit date: 03/05/1984    Years since quitting: 34.9  . Smokeless tobacco: Never Used  Substance and Sexual Activity  . Alcohol  use: No  . Drug use: No  . Sexual activity: Not on file  Lifestyle  . Physical activity    Days per week: Not on file    Minutes per session: Not on file  . Stress: Not on file  Relationships  . Social Herbalist on phone: Not on file    Gets together: Not on file    Attends religious service:  Not on file    Active member of club or organization: Not on file    Attends meetings of clubs or organizations: Not on file    Relationship status: Not on file  . Intimate partner violence    Fear of current or ex partner: Not on file    Emotionally abused: Not on file    Physically abused: Not on file    Forced sexual activity: Not on file  Other Topics Concern  . Not on file  Social History Narrative  . Not on file    FAMILY HISTORY: Family History  Problem Relation Age of Onset  . Diabetes Mother   . Arthritis Mother   . Heart disease Father   . Breast cancer Neg Hx     ALLERGIES:  is allergic to influenza vaccines.  MEDICATIONS:  Current Outpatient Medications  Medication Sig Dispense Refill  . cholecalciferol (VITAMIN D) 1000 units tablet Take 1,000 Units by mouth daily.    Marland Kitchen omeprazole (PRILOSEC) 40 MG capsule Take 1 capsule (40 mg total) by mouth daily. 90 capsule 1  . ciclopirox (PENLAC) 8 % solution Apply topically at bedtime. Apply over nail and skin. Apply daily over previous coat. After seven (7) days, remove with alcohol and continue (Patient not taking: Reported on 01/28/2019) 6.6 mL 0  . PARoxetine (PAXIL) 20 MG tablet Take 1/2 tablet daily for 2 weeks.  Then take 1/2 tablet every other day for 2 weeks.  Then, stop. (Patient not taking: Reported on 12/19/2018) 30 tablet 0   No current facility-administered medications for this visit.      Marland Kitchen  PHYSICAL EXAMINATION:   Vitals:   01/28/19 1002  BP: 138/84  Pulse: 83  Temp: 98.4 F (36.9 C)   Filed Weights   01/28/19 1002  Weight: 203 lb (92.1 kg)    Physical Exam  Constitutional: She is oriented to person, place, and time and well-developed, well-nourished, and in no distress.  HENT:  Head: Normocephalic and atraumatic.  Mouth/Throat: Oropharynx is clear and moist. No oropharyngeal exudate.  Eyes: Pupils are equal, round, and reactive to light.  Neck: Normal range of motion. Neck supple.   Cardiovascular: Normal rate and regular rhythm.  Pulmonary/Chest: Effort normal and breath sounds normal. No respiratory distress. She has no wheezes.  Abdominal: Soft. Bowel sounds are normal. She exhibits no distension and no mass. There is no abdominal tenderness. There is no rebound and no guarding.  Musculoskeletal: Normal range of motion.        General: No tenderness or edema.  Neurological: She is alert and oriented to person, place, and time.  Skin: Skin is warm.  Psychiatric: Affect normal.     LABORATORY DATA:  I have reviewed the data as listed Lab Results  Component Value Date   WBC 10.4 01/28/2019   HGB 12.2 01/28/2019   HCT 38.3 01/28/2019   MCV 82.0 01/28/2019   PLT 104 (L) 01/28/2019   Recent Labs    10/21/18 1613  NA 141  K 4.6  CL 104  CO2 22  GLUCOSE 93  BUN 14  CREATININE 0.86  CALCIUM 9.2  GFRNONAA 77  GFRAA 89  PROT 6.4  ALBUMIN 4.2  AST 20  ALT 13  ALKPHOS 124*  BILITOT 0.4     No results found.  ASSESSMENT & PLAN:   Thrombocytopenia ([place]) #Isolated thrombocytopenia- 97-99 [OCT 2020]; normal counts in 2018.  Likley ITP; today platelet count 104.  Hold off bone marrow biopsy at this time.  #Discussed the treatment options of ITP include steroids/infusion/rituximab; tablets like Promacta.  However as patient is asymptomatic/would continue surveillance at this time  #History of breast cancer ER/PR positive  [as per patient]. II lobular s/p mastec s/p TRAM; no RT s/p Chemo; no RT. s/p aromatase negative.   STABLE.   # Anxiety/loss of son: Awaiting evaluation with Dr.K today for anxiolytic.  # DISPOSITION: # follow up in 4 months- MD-VIDEO; 1 day prior cbc- Dr.B  All questions were answered. The patient knows to call the clinic with any problems, questions or concerns.  Thank you Dr. for allowing me to participate in the care of your pleasant patient. Please do not hesitate to contact me with questions or concerns in the interim.    Cammie Sickle, MD 01/28/2019 12:28 PM

## 2019-01-28 NOTE — Progress Notes (Addendum)
Virtual Visit via Telephone The purpose of this virtual visit is to provide medical care while limiting exposure to the novel coronavirus (COVID19) for both patient and office staff.  Consent was obtained for phone visit:  Yes.   Answered questions that patient had about telehealth interaction:  Yes.   I discussed the limitations, risks, security and privacy concerns of performing an evaluation and management service by telephone. I also discussed with the patient that there may be a patient responsible charge related to this service. The patient expressed understanding and agreed to proceed.  Patient Location: Home Provider Location: [facility])  ---------------------------------------------------------------------- Chief Complaint  Patient presents with  . Anxiety    situational anxiety. The pt son just passed away unexpected    Previous PCP Cassell Smiles, AGPCNP-BC   S: Reviewed CMA documentation. I have called patient and gathered additional HPI as follows:  Acute Adjustment with acute anxiety and panic attack Recent unexpected loss of her son within past week on 11/17 due to accident with burn barrel exploding. - Background history - She has known history of anxiety in past and was on SSRI Previously was on Paroxetine 20mg  daily in past from previous provider for about 1.5 years, she weaned off of it back in 10/2018. She admitted some headaches when weaning off med. - Now she remains off any mood or anxiety medicine. - Years ago she took low dose Xanax PRN back when diagnosed with cancer. She did well with it but it has been 5-10 years ago. She was on lowest dose possible at that time. Did not take long or finish course. - Appetite reduced Admits crying spells She denies significant depression says she is coping and in bereavement but is trying to adjust to this major life stressor suddenly She has upcoming arrangement with work for counseling  Patient is  currently working but will return next week., was out due to this event Denies any high risk travel to areas of current concern for COVID19. Denies any known or suspected exposure to person with or possibly with COVID19.  Denies any fevers, chills, sweats, body ache, cough, shortness of breath, sinus pain or pressure, headache, abdominal pain, diarrhea  Past Medical History:  Diagnosis Date  . Breast cancer ([place]) 03-17-2009   lt Mastectomy  . Cancer ([place])   . GERD (gastroesophageal reflux disease)    Social History   Tobacco Use  . Smoking status: Former Smoker    Quit date: 03/05/1984    Years since quitting: 34.9  . Smokeless tobacco: Never Used  Substance Use Topics  . Alcohol use: No  . Drug use: No    Current Outpatient Medications:  .  cholecalciferol (VITAMIN D) 1000 units tablet, Take 1,000 Units by mouth daily., Disp: , Rfl:  .  ciclopirox (PENLAC) 8 % solution, Apply topically at bedtime. Apply over nail and skin. Apply daily over previous coat. After seven (7) days, remove with alcohol and continue, Disp: 6.6 mL, Rfl: 0 .  omeprazole (PRILOSEC) 40 MG capsule, Take 1 capsule (40 mg total) by mouth daily., Disp: 90 capsule, Rfl: 1 .  ALPRAZolam (XANAX) 0.5 MG tablet, Take 0.5-1 tablets (0.25-0.5 mg total) by mouth at bedtime as needed for anxiety., Disp: 10 tablet, Rfl: 0  DECLINED repeat PHQ / GAD today She is unable to accurately answer these questions due to acute stressor, since these do not apply to her usual function - declines to answer  Depression screen [facility] 2/9 10/17/2018 10/15/2018  04/18/2018  Decreased Interest 0 0 0  Down, Depressed, Hopeless 0 0 0  PHQ - 2 Score 0 0 0  Altered sleeping 0 - 0  Tired, decreased energy 0 - 0  Change in appetite 0 - 0  Feeling bad or failure about yourself  0 - 0  Trouble concentrating 0 - 0  Moving slowly or fidgety/restless 0 - 0  Suicidal thoughts 0 - 0  PHQ-9 Score 0 - 0  Difficult doing work/chores Not difficult at all -  Not difficult at all    GAD 7 : Generalized Anxiety Score 10/17/2018 04/18/2018 10/11/2017 05/10/2017  Nervous, Anxious, on Edge 0 0 0 2  Control/stop worrying 0 0 0 2  Worry too much - different things 0 0 0 1  Trouble relaxing 0 0 0 1  Restless 0 0 0 0  Easily annoyed or irritable 0 0 0 1  Afraid - awful might happen 0 0 0 0  Total GAD 7 Score 0 0 0 7  Anxiety Difficulty Not difficult at all Not difficult at all Not difficult at all Not difficult at all    -------------------------------------------------------------------------- O: No physical exam performed due to remote telephone encounter.  Lab results reviewed.  Recent Results (from the past 2160 hour(s))  CBC with Differential/Platelet     Status: Abnormal   Collection Time: 12/02/18  1:42 PM  Result Value Ref Range   WBC 9.0 3.4 - 10.8 x10E3/uL   RBC 4.82 3.77 - 5.28 x10E6/uL   Hemoglobin 13.2 11.1 - 15.9 g/dL   Hematocrit 39.9 34.0 - 46.6 %   MCV 83 79 - 97 fL   MCH 27.4 26.6 - 33.0 pg   MCHC 33.1 31.5 - 35.7 g/dL   RDW 14.8 11.7 - 15.4 %   Platelets 99 (LL) 150 - 450 x10E3/uL   Neutrophils 57 Not Estab. %   Lymphs 34 Not Estab. %   Monocytes 6 Not Estab. %   Eos 2 Not Estab. %   Basos 1 Not Estab. %   Neutrophils Absolute 5.1 1.4 - 7.0 x10E3/uL   Lymphocytes Absolute 3.1 0.7 - 3.1 x10E3/uL   Monocytes Absolute 0.5 0.1 - 0.9 x10E3/uL   EOS (ABSOLUTE) 0.2 0.0 - 0.4 x10E3/uL   Basophils Absolute 0.1 0.0 - 0.2 x10E3/uL   Immature Granulocytes 0 Not Estab. %   Immature Grans (Abs) 0.0 0.0 - 0.1 x10E3/uL   Hematology Comments: Note:     Comment: Verified by microscopic examination.  Hepatitis B surface antigen     Status: None   Collection Time: 12/19/18 11:58 AM  Result Value Ref Range   Hepatitis B Surface Ag NON REACTIVE NON REACTIVE    Comment: Performed at [facility] [address] [place], [place] [address]  Hepatitis C antibody     Status: None   Collection Time: 12/19/18 11:58 AM  Result Value  Ref Range   HCV Ab NON REACTIVE NON REACTIVE    Comment: (NOTE) Nonreactive HCV antibody screen is consistent with no HCV infections,  unless recent infection is suspected or other evidence exists to indicate HCV infection. Performed at [facility], [place] [address] [place], [place] [address]   Lactate dehydrogenase     Status: None   Collection Time: 12/19/18 11:58 AM  Result Value Ref Range   LDH 137 98 - 192 U/L    Comment: Performed at [facility], [address], [place], [place] [address]  HIV Antibody (routine  testing w rflx)     Status: None   Collection Time: 12/19/18 11:58 AM  Result Value Ref Range   HIV Screen 4th Generation wRfx NON REACTIVE NON REACTIVE    Comment: Performed at [facility], [address], [place], [place] [address]  CBC with Differential/Platelet     Status: Abnormal   Collection Time: 12/19/18 12:03 PM  Result Value Ref Range   WBC 9.7 4.0 - 10.5 K/uL   RBC 4.91 3.87 - 5.11 MIL/uL   Hemoglobin 13.0 12.0 - 15.0 g/dL   HCT 39.9 36.0 - 46.0 %   MCV 81.3 80.0 - 100.0 fL   MCH 26.5 26.0 - 34.0 pg   MCHC 32.6 30.0 - 36.0 g/dL   RDW 14.6 11.5 - 15.5 %   Platelets 95 (L) 150 - 400 K/uL   nRBC 0.0 0.0 - 0.2 %   Neutrophils Relative % 62 %   Neutro Abs 6.0 1.7 - 7.7 K/uL   Lymphocytes Relative 30 %   Lymphs Abs 3.0 0.7 - 4.0 K/uL   Monocytes Relative 6 %   Monocytes Absolute 0.6 0.1 - 1.0 K/uL   Eosinophils Relative 1 %   Eosinophils Absolute 0.1 0.0 - 0.5 K/uL   Basophils Relative 1 %   Basophils Absolute 0.1 0.0 - 0.1 K/uL   Immature Granulocytes 0 %   Abs Immature Granulocytes 0.04 0.00 - 0.07 K/uL    Comment: Performed at [facility], [address], [place], [place] [address]  Technologist smear review     Status: None   Collection Time: 12/19/18 12:03 PM  Result Value Ref Range   WBC Morphology MORPHOLOGY UNREMARKABLE    RBC Morphology MORPHOLOGY UNREMARKABLE    Tech Review PLATELETS APPEAR DECREASED      Comment: Normal platelet morphology Performed at [facility], [address] [place], [place] [address]   CBC with Differential     Status: Abnormal   Collection Time: 01/05/19 11:20 AM  Result Value Ref Range   WBC 10.4 4.0 - 10.5 K/uL   RBC 4.73 3.87 - 5.11 MIL/uL   Hemoglobin 12.6 12.0 - 15.0 g/dL   HCT 38.8 36.0 - 46.0 %   MCV 82.0 80.0 - 100.0 fL   MCH 26.6 26.0 - 34.0 pg   MCHC 32.5 30.0 - 36.0 g/dL   RDW 15.1 11.5 - 15.5 %   Platelets 101 (L) 150 - 400 K/uL   nRBC 0.0 0.0 - 0.2 %   Neutrophils Relative % 63 %   Neutro Abs 6.6 1.7 - 7.7 K/uL   Lymphocytes Relative 29 %   Lymphs Abs 3.0 0.7 - 4.0 K/uL   Monocytes Relative 6 %   Monocytes Absolute 0.6 0.1 - 1.0 K/uL   Eosinophils Relative 1 %   Eosinophils Absolute 0.1 0.0 - 0.5 K/uL   Basophils Relative 1 %   Basophils Absolute 0.1 0.0 - 0.1 K/uL   Immature Granulocytes 0 %   Abs Immature Granulocytes 0.04 0.00 - 0.07 K/uL    Comment: Performed at [facility], [place], [place] [address]  CBC with Differential     Status: Abnormal   Collection Time: 01/16/19 11:06 AM  Result Value Ref Range   WBC 10.1 4.0 - 10.5 K/uL   RBC 4.65 3.87 - 5.11 MIL/uL   Hemoglobin 12.2 12.0 - 15.0 g/dL   HCT 38.2 36.0 - 46.0 %   MCV 82.2 80.0 - 100.0 fL  MCH 26.2 26.0 - 34.0 pg   MCHC 31.9 30.0 - 36.0 g/dL   RDW 15.0 11.5 - 15.5 %   Platelets 94 (L) 150 - 400 K/uL   nRBC 0.0 0.0 - 0.2 %   Neutrophils Relative % 62 %   Neutro Abs 6.3 1.7 - 7.7 K/uL   Lymphocytes Relative 31 %   Lymphs Abs 3.1 0.7 - 4.0 K/uL   Monocytes Relative 5 %   Monocytes Absolute 0.5 0.1 - 1.0 K/uL   Eosinophils Relative 1 %   Eosinophils Absolute 0.1 0.0 - 0.5 K/uL   Basophils Relative 1 %   Basophils Absolute 0.1 0.0 - 0.1 K/uL   Immature Granulocytes 0 %   Abs Immature Granulocytes 0.04 0.00 - 0.07 K/uL    Comment: Performed at [facility], [address] [place], [place] [address]  CBC with Differential      Status: Abnormal   Collection Time: 01/28/19  9:28 AM  Result Value Ref Range   WBC 10.4 4.0 - 10.5 K/uL   RBC 4.67 3.87 - 5.11 MIL/uL   Hemoglobin 12.2 12.0 - 15.0 g/dL   HCT 38.3 36.0 - 46.0 %   MCV 82.0 80.0 - 100.0 fL   MCH 26.1 26.0 - 34.0 pg   MCHC 31.9 30.0 - 36.0 g/dL   RDW 15.3 11.5 - 15.5 %   Platelets 104 (L) 150 - 400 K/uL   nRBC 0.0 0.0 - 0.2 %   Neutrophils Relative % 63 %   Neutro Abs 6.5 1.7 - 7.7 K/uL   Lymphocytes Relative 29 %   Lymphs Abs 3.0 0.7 - 4.0 K/uL   Monocytes Relative 6 %   Monocytes Absolute 0.6 0.1 - 1.0 K/uL   Eosinophils Relative 1 %   Eosinophils Absolute 0.1 0.0 - 0.5 K/uL   Basophils Relative 1 %   Basophils Absolute 0.1 0.0 - 0.1 K/uL   Immature Granulocytes 0 %   Abs Immature Granulocytes 0.03 0.00 - 0.07 K/uL    Comment: Performed at [facility], [address] [place], [place] [address]    -------------------------------------------------------------------------- A&P:  Problem List Items Addressed This Visit    None    Visit Diagnoses    Acute adjustment disorder with anxiety    -  Primary   Relevant Medications   ALPRAZolam (XANAX) 0.5 MG tablet   Psychophysiological insomnia       Bereavement reaction         Acute on chronic anxiety with adjustment / bereavement due to sudden unexpected loss of her son Previously on SSRI Paxil, now off in past 3 months, seems mixed results back on SSRI Had been managing anxiety well off medicine Remote history 5-10 yr ago with past cancer diagnosis on Xanax low dose PRN short term only Associated insomnia with rare panic attack  Plan - Discussion on anxiety management options with medications. We agree that short term relief is goal right now for acute problem. She just recently finished SSRI in past 3 months, and does not wish to resume longer term SSRI therapy. This was offered but we can reconsider in future, I suggested Lexapro or other option if she needs to resume this. -  Since she has tolerated low dose BDZ Xanax in past, we can continue with this option - rx Alprazolam 0.5mg  tab take half or whole tab nightly PRN for next 1-2 weeks as need, given precautions of risk of dependence potential - Also consider future other options  such as sleeping aid trazodone, or other anxiety med buspar - Encourage proceed with upcoming counseling / therapy through her work  Return criteria given   Meds ordered this encounter  Medications  . ALPRAZolam (XANAX) 0.5 MG tablet    Sig: Take 0.5-1 tablets (0.25-0.5 mg total) by mouth at bedtime as needed for anxiety.    Dispense:  10 tablet    Refill:  0    Follow-up: - Return in 1-2 weeks as needed for anxiety if not improve  Patient verbalizes understanding with the above medical recommendations including the limitation of remote medical advice.  Specific follow-up and call-back criteria were given for patient to follow-up or seek medical care more urgently if needed.   - Time spent in direct consultation with patient on phone: 9 minutes  Nobie Putnam, [facility] 01/28/2019, 3:10 PM

## 2019-01-28 NOTE — Assessment & Plan Note (Addendum)
#  Isolated thrombocytopenia- 97-99 [OCT 2020]; normal counts in 2018.  Likley ITP; today platelet count 104.  Hold off bone marrow biopsy at this time.  #Discussed the treatment options of ITP include steroids/infusion/rituximab; tablets like Promacta.  However as patient is asymptomatic/would continue surveillance at this time  #History of breast cancer ER/PR positive  [as per patient]. II lobular s/p mastec s/p TRAM; no RT s/p Chemo; no RT. s/p aromatase negative.   STABLE.   # Anxiety/loss of son: Awaiting evaluation with Dr.K today for anxiolytic.  # DISPOSITION: # follow up in 4 months- MD-VIDEO; 1 day prior cbc- Dr.B

## 2019-04-15 DIAGNOSIS — K219 Gastro-esophageal reflux disease without esophagitis: Secondary | ICD-10-CM

## 2019-04-28 ENCOUNTER — Telehealth: Payer: No Typology Code available for payment source | Admitting: Nurse Practitioner

## 2019-04-28 DIAGNOSIS — N3000 Acute cystitis without hematuria: Secondary | ICD-10-CM

## 2019-04-28 MED ORDER — CEPHALEXIN 500 MG PO CAPS: 500.0000 mg | ORAL_CAPSULE | Freq: Two times a day (BID) | ORAL | 0 refills | Status: DC

## 2019-04-28 MED ORDER — FLUCONAZOLE 150 MG PO TABS: 150.0000 mg | ORAL_TABLET | Freq: Once | ORAL | 0 refills | Status: AC

## 2019-04-28 NOTE — Progress Notes (Signed)

## 2019-05-11 ENCOUNTER — Other Ambulatory Visit: Payer: Self-pay

## 2019-05-11 ENCOUNTER — Encounter: Payer: Self-pay | Admitting: Family Medicine

## 2019-05-11 ENCOUNTER — Ambulatory Visit (INDEPENDENT_AMBULATORY_CARE_PROVIDER_SITE_OTHER): Payer: No Typology Code available for payment source | Admitting: Family Medicine

## 2019-05-11 DIAGNOSIS — K219 Gastro-esophageal reflux disease without esophagitis: Secondary | ICD-10-CM

## 2019-05-11 DIAGNOSIS — F419 Anxiety disorder, unspecified: Secondary | ICD-10-CM | POA: Diagnosis not present

## 2019-05-11 MED ORDER — OMEPRAZOLE 40 MG PO CPDR: 40.0000 mg | DELAYED_RELEASE_CAPSULE | Freq: Every day | ORAL | 1 refills | Status: DC

## 2019-05-11 MED ORDER — PAROXETINE HCL 20 MG PO TABS: 20.0000 mg | ORAL_TABLET | Freq: Every day | ORAL | 1 refills | Status: AC

## 2019-05-11 NOTE — Progress Notes (Signed)
Virtual Visit via Telephone The purpose of this virtual visit is to provide medical care while limiting exposure to the novel coronavirus (COVID19) for both patient and office staff.  Consent was obtained for phone visit:  Yes.   Answered questions that patient had about telehealth interaction:  Yes.   I discussed the limitations, risks, security and privacy concerns of performing an evaluation and management service by telephone. I also discussed with the patient that there may be a patient responsible charge related to this service. The patient expressed understanding and agreed to proceed.  Patient Location: Home Provider Location: [facility])  ---------------------------------------------------------------------- Chief Complaint  Patient presents with  . Anxiety    S: Reviewed CMA documentation. I have called patient and gathered additional HPI as follows:  Natasha Dennis meets today telephonically to review her anxiety.  Has been going to counseling with the hospital and is finding some relief with that.  Has been taking the alprazolam as needed before bed to help with sleep.  Has an appointment with gastroenterology on 05/25/2019 for worsening GERD.  Has been eating ok.  Discussed restarting on paroxetine for anxiety, as she had tolerated this medication in the past, and can give her some assistance with her daily anxiety/depression.  Agreeable with plan.  Patient is currently working Denies any high risk travel to areas of current concern for [place]. Denies any known or suspected exposure to person with or possibly with COVID19.  Denies any fevers, chills, sweats, body ache, cough, shortness of breath, sinus pain or pressure, headache, abdominal pain, diarrhea, SI/HI  Past Medical History:  Diagnosis Date  . Breast cancer ([place]) 03-17-2009   lt Mastectomy  . Cancer ([place])   . GERD (gastroesophageal reflux disease)    Social History   Tobacco Use  .  Smoking status: Former Smoker    Quit date: 03/05/1984    Years since quitting: 35.2  . Smokeless tobacco: Never Used  Substance Use Topics  . Alcohol use: No  . Drug use: No    Current Outpatient Medications:  .  ALPRAZolam (XANAX) 0.5 MG tablet, Take 0.5-1 tablets (0.25-0.5 mg total) by mouth at bedtime as needed for anxiety., Disp: 10 tablet, Rfl: 0 .  cholecalciferol (VITAMIN D) 1000 units tablet, Take 1,000 Units by mouth daily., Disp: , Rfl:  .  omeprazole (PRILOSEC) 40 MG capsule, Take 1 capsule (40 mg total) by mouth daily., Disp: 90 capsule, Rfl: 1 .  ciclopirox (PENLAC) 8 % solution, Apply topically at bedtime. Apply over nail and skin. Apply daily over previous coat. After seven (7) days, remove with alcohol and continue (Patient not taking: Reported on 05/11/2019), Disp: 6.6 mL, Rfl: 0 .  PARoxetine (PAXIL) 20 MG tablet, Take 1 tablet (20 mg total) by mouth daily., Disp: 90 tablet, Rfl: 1  Depression screen [facility] 2/9 05/11/2019 10/17/2018 10/15/2018  Decreased Interest 0 0 0  Down, Depressed, Hopeless 0 0 0  PHQ - 2 Score 0 0 0  Altered sleeping 1 0 -  Tired, decreased energy 0 0 -  Change in appetite 0 0 -  Feeling bad or failure about yourself  0 0 -  Trouble concentrating 0 0 -  Moving slowly or fidgety/restless 0 0 -  Suicidal thoughts 0 0 -  PHQ-9 Score 1 0 -  Difficult doing work/chores Not difficult at all Not difficult at all -    GAD 7 : Generalized Anxiety Score 05/11/2019 10/17/2018 04/18/2018 10/11/2017  Nervous, Anxious, on Edge  1 0 0 0  Control/stop worrying 0 0 0 0  Worry too much - different things 0 0 0 0  Trouble relaxing 1 0 0 0  Restless 0 0 0 0  Easily annoyed or irritable 0 0 0 0  Afraid - awful might happen 0 0 0 0  Total GAD 7 Score 2 0 0 0  Anxiety Difficulty Not difficult at all Not difficult at all Not difficult at all Not difficult at all    -------------------------------------------------------------------------- O: No physical exam performed due  to remote telephone encounter.   No results found for this or any previous visit (from the past 2160 hour(s)).  -------------------------------------------------------------------------- A&P:  Problem List Items Addressed This Visit      Digestive   GERD (gastroesophageal reflux disease)    Currently taking Omeprazole 40mg  daily with some worsening.  Will be seeing Gastroenterology 05/25/2019 for evaluation.      Relevant Medications   omeprazole (PRILOSEC) 40 MG capsule     Other   Anxiety - Primary    Uncontrolled anxiety/depression with current PRN dosing of alprazolam.  Meeting with counselors at the hospital for therapy and adjustment disorder related to her son's unexpected passing in November 2020.  Has continued to take the alprazolam as needed for sleep at night, but anxiety manifesting as worsening GERD.  Will restart on Paroxetine 20mg  daily and follow up in 1 month for reassessment of symptoms, sooner should the need arise.      Relevant Medications   PARoxetine (PAXIL) 20 MG tablet      Meds ordered this encounter  Medications  . omeprazole (PRILOSEC) 40 MG capsule    Sig: Take 1 capsule (40 mg total) by mouth daily.    Dispense:  90 capsule    Refill:  1  . PARoxetine (PAXIL) 20 MG tablet    Sig: Take 1 tablet (20 mg total) by mouth daily.    Dispense:  90 tablet    Refill:  1    Follow-up: - Return in 1 month for anxiety and medication follow up - Return in 3 months for physical  Patient verbalizes understanding with the above medical recommendations including the limitation of remote medical advice.  Specific follow-up and call-back criteria were given for patient to follow-up or seek medical care more urgently if needed.   - Time spent in direct consultation with patient on phone: 13 minutes  Harlin Rain, [facility] 05/11/2019, 2:10 PM

## 2019-05-11 NOTE — Assessment & Plan Note (Signed)
Uncontrolled anxiety/depression with current PRN dosing of alprazolam.  Meeting with counselors at the hospital for therapy and adjustment disorder related to her son's unexpected passing in November 2020.  Has continued to take the alprazolam as needed for sleep at night, but anxiety manifesting as worsening GERD.  Will restart on Paroxetine 20mg  daily and follow up in 1 month for reassessment of symptoms, sooner should the need arise.

## 2019-05-11 NOTE — Assessment & Plan Note (Signed)
Currently taking Omeprazole 40mg  daily with some worsening.  Will be seeing Gastroenterology 05/25/2019 for evaluation.

## 2019-05-11 NOTE — Patient Instructions (Signed)
As we discussed, continue with counseling as needed through the hospital.  Can put in a referral for additional counseling should the need arise.  Sent in a prescription for Paroxetine 20mg  to take 1 tablet daily for anxiety/depression.  Can look into Trazodone as needed for sleep if still having difficulty sleeping.  We are here as a partner in your healthcare, if you have any needs that arise, please send a message through MyChart or call the office.   As discussed, it sounds like your symptoms are primarily related to anxiety / adjustment disorder. This is a very common problem and be related to several factors, including life stressors. Start treatment with paroxetine, take 20mg  daily. As discussed most anxiety medications are also used for mood disorders such as depression, because they work on similar chemicals in your brain. It may take up to 3-4 weeks for the medicine to take full effect and for you to notice a difference, sometimes you may notice it working sooner, otherwise we may need to adjust the dose.  The following recommendations are helpful adjuncts for helping rebalance your mood.  Eat a nourishing diet. Ensure adequate intake of calories, protein, carbs, fat, vitamins, and minerals. Prioritize whole foods at each meal, including meats, vegetables, fruits, nuts and seeds, etc.   Avoid inflammatory and/or "junk" foods, such as sugar, omega-6 fats, refined grains, chemicals, and preservatives are common in packaged and prepared foods. Minimize or completely avoid these ingredients and stick to whole foods with little to no additives. Cook from scratch as much as possible for more control over what you eat  Get enough sleep. Poor sleep is significantly associated with depression and anxiety. Make 7-9 hours of sleep nightly a top priority  Exercise appropriately. Exercise is known to improve brain functioning and boost mood. Aim for 30 minutes of daily physical activity. Avoid  "overtraining," which can cause mental disturbances  Assess your light exposure. Not enough natural light during the day and too much artificial light can have a major impact on your mood. Get outside as often as possible during daylight hours. Minimize light exposure after dark and avoid the use of electronics that give off blue light before bed  Manage your stress.  Use daily stress management techniques such as meditation, yoga, or mindfulness to retrain your brain to respond differently to stress. Try deep breathing to deactivate your "fight or flight" response.  There are many of sources with apps like Headspace, Calm or a variety of YouTube videos (videos from Gwynne Edinger have guided meditation)  Prioritize your social life. Work on building social support with new friends or improve current relationships. Consider getting a pet that allows for companionship, social interaction, and physical touch. Try volunteering or joining a faith-based community to increase your sense of purpose  4-7-8 breathing technique at bedtime: breathe in to count of 4, hold breath for count of 7, exhale for count of 8; do 3-5 times for letting go of overactive thoughts  Take time to play Unstructured "play" time can help reduce anxiety and depression Options for play include music, games, sports, dance, art, etc.  Try to add daily omega 3 fatty acids, magnesium, B complex, and balanced amino acid supplements to help improve mood and anxiety.  We will plan to see you back in 1 month for follow up on anxiety and in 3 months for a physical and to review preventative screenings and have labs drawn.  You will receive a survey after today's visit either  digitally by e-mail or paper by [employer]. Your experiences and feedback matter to [place].  Please respond so we know how we are doing as we provide care for you.  Call us with any questions/concerns/needs.  It is my goal to be available to you for your health  concerns.  Thanks for choosing me to be a partner in your healthcare needs!  Harlin Rain, FNP-C Family Nurse Practitioner [facility] Phone: [phone]

## 2019-05-25 ENCOUNTER — Other Ambulatory Visit: Payer: Self-pay

## 2019-05-25 ENCOUNTER — Encounter: Payer: Self-pay | Admitting: Gastroenterology

## 2019-05-25 ENCOUNTER — Ambulatory Visit (INDEPENDENT_AMBULATORY_CARE_PROVIDER_SITE_OTHER): Payer: No Typology Code available for payment source | Admitting: Gastroenterology

## 2019-05-25 VITALS — BP 108/67 | HR 67 | Temp 97.2°F | Ht 65.0 in | Wt 192.6 lb

## 2019-05-25 DIAGNOSIS — K2100 Gastro-esophageal reflux disease with esophagitis, without bleeding: Secondary | ICD-10-CM | POA: Diagnosis not present

## 2019-05-25 NOTE — Progress Notes (Signed)
Gastroenterology Consultation  Referring Provider:     Nobie Putnam * Primary Care Physician:  Verl Bangs, FNP Primary Gastroenterologist:  Dr. Allen Norris     Reason for Consultation:     GERD        HPI:   Natasha Dennis is a 54 y.o. y/o female referred for consultation & management of GERD by Dr. Lorine Bears, Lupita Raider, FNP.  This patient comes in today with a history of GERD.  The patient suffers from anxiety and depression due to the unanticipated death of her son in Jun 07, 2018.  The patient was reported to have one of her her symptoms manifest as increasing GERD.  The patient had been seen in the past by Dr. Vira Agar and Jun 07, 2010 and by Dr. Julianne Handler in 07-Jun-2011.  The patient reports that she started having burning in her epigastric area while her son was still in the hospital before he passed away.  She has lost some weight without trying but she also thinks this is related to her son's loss.  The patient reports that she has liquid which was described as burning coming up her mouth when she bends over.  She has had a flap construction after breast cancer and was told that she may develop a hernia in the epigastric area.  She denies any black stools or bloody stools and states that her last colonoscopy was around 7 years ago. The patient does report that she feels full fast after eating and recently had a friend's husband developed metastatic esophageal cancer.  Past Medical History:  Diagnosis Date  . Breast cancer ([place]) 03-17-2009   lt Mastectomy  . Cancer ([place])   . GERD (gastroesophageal reflux disease)     Past Surgical History:  Procedure Laterality Date  . ABDOMINAL HYSTERECTOMY    . AUGMENTATION MAMMAPLASTY Right    RT BREAST IMPLANT ONLY  . MASTECTOMY Left 2009/06/06    Prior to Admission medications   Medication Sig Start Date End Date Taking? Authorizing Provider  ALPRAZolam Duanne Moron) 0.5 MG tablet Take 0.5-1 tablets (0.25-0.5 mg total) by mouth at bedtime as needed for anxiety.  01/28/19   Karamalegos, Devonne Doughty, DO  cholecalciferol (VITAMIN D) 1000 units tablet Take 1,000 Units by mouth daily.    [provider]  ciclopirox (PENLAC) 8 % solution Apply topically at bedtime. Apply over nail and skin. Apply daily over previous coat. After seven (7) days, remove with alcohol and continue Patient not taking: Reported on 05/11/2019 04/18/18   Mikey College, NP  omeprazole (PRILOSEC) 40 MG capsule Take 1 capsule (40 mg total) by mouth daily. 05/11/19   Malfi, Lupita Raider, FNP  PARoxetine (PAXIL) 20 MG tablet Take 1 tablet (20 mg total) by mouth daily. 05/11/19   Malfi, Lupita Raider, FNP    Family History  Problem Relation Age of Onset  . Diabetes Mother   . Arthritis Mother   . Heart disease Father   . Breast cancer Neg Hx      Social History   Tobacco Use  . Smoking status: Former Smoker    Quit date: 03/05/1984    Years since quitting: 35.2  . Smokeless tobacco: Never Used  Substance Use Topics  . Alcohol use: No  . Drug use: No    Allergies as of 05/25/2019 - Review Complete 05/25/2019  Allergen Reaction Noted  . Influenza vaccines Swelling 02/08/2017    Review of Systems:    All systems reviewed and negative except where noted in  HPI.   Physical Exam:  BP 108/67   Pulse 67   Temp (!) 97.2 F (36.2 C) (Temporal)   Ht 5\' 5"  (1.651 m)   Wt 192 lb 9.6 oz (87.4 kg)   BMI 32.05 kg/m  No LMP recorded. Patient has had a hysterectomy. General:   Alert,  Well-developed, well-nourished, pleasant and cooperative in NAD Head:  Normocephalic and atraumatic. Eyes:  Sclera clear, no icterus.   Conjunctiva pink. Ears:  Normal auditory acuity. Neck:  Supple; no masses or thyromegaly. Lungs:  Respirations even and unlabored.  Clear throughout to auscultation.   No wheezes, crackles, or rhonchi. No acute distress. Heart:  Regular rate and rhythm; no murmurs, clicks, rubs, or gallops. Abdomen:  Normal bowel sounds.  No bruits.  Soft, non-tender and  non-distended without masses, hepatosplenomegaly or hernias noted.  No guarding or rebound tenderness.  Negative Carnett sign.  After the physical exam the patient went back to sit off the exam table and reported that her epigastric area started burning shortly after we did the straight leg raises. Rectal:  Deferred.  Pulses:  Normal pulses noted. Extremities:  No clubbing or edema.  No cyanosis. Neurologic:  Alert and oriented x3;  grossly normal neurologically. Skin:  Intact without significant lesions or rashes.  No jaundice. Lymph Nodes:  No significant cervical adenopathy. Psych:  Alert and cooperative. Normal mood and affect.  Imaging Studies: No results found.  Assessment and Plan:   [name] is a 54 y.o. y/o female who comes in today with epigastric discomfort that may be related to musculoskeletal but since the patient has had chronic reflux with burning liquid coming up her throat the patient will be set up for an upper endoscopy to rule out any other pathology that would explain the PPI not working on her and her early satiety.  The patient will also be given samples of Dexilant to be taken once a day to see if that helps her symptoms.  The patient has been explained the plan and agrees with it.    Lucilla Lame, MD. Marval Regal    Note: This dictation was prepared with Dragon dictation along with smaller phrase technology. Any transcriptional errors that result from this process are unintentional.

## 2019-05-26 ENCOUNTER — Inpatient Hospital Stay: Payer: No Typology Code available for payment source

## 2019-05-26 ENCOUNTER — Inpatient Hospital Stay: Payer: No Typology Code available for payment source | Attending: Internal Medicine

## 2019-05-26 ENCOUNTER — Other Ambulatory Visit: Payer: No Typology Code available for payment source

## 2019-05-26 ENCOUNTER — Telehealth: Payer: Self-pay | Admitting: Internal Medicine

## 2019-05-26 DIAGNOSIS — Z634 Disappearance and death of family member: Secondary | ICD-10-CM | POA: Diagnosis not present

## 2019-05-26 DIAGNOSIS — R634 Abnormal weight loss: Secondary | ICD-10-CM | POA: Insufficient documentation

## 2019-05-26 DIAGNOSIS — D696 Thrombocytopenia, unspecified: Secondary | ICD-10-CM | POA: Diagnosis present

## 2019-05-26 DIAGNOSIS — Z90710 Acquired absence of both cervix and uterus: Secondary | ICD-10-CM | POA: Diagnosis not present

## 2019-05-26 DIAGNOSIS — Z8249 Family history of ischemic heart disease and other diseases of the circulatory system: Secondary | ICD-10-CM | POA: Insufficient documentation

## 2019-05-26 DIAGNOSIS — Z833 Family history of diabetes mellitus: Secondary | ICD-10-CM | POA: Insufficient documentation

## 2019-05-26 DIAGNOSIS — R1013 Epigastric pain: Secondary | ICD-10-CM | POA: Insufficient documentation

## 2019-05-26 DIAGNOSIS — Z9012 Acquired absence of left breast and nipple: Secondary | ICD-10-CM | POA: Diagnosis not present

## 2019-05-26 DIAGNOSIS — F419 Anxiety disorder, unspecified: Secondary | ICD-10-CM | POA: Insufficient documentation

## 2019-05-26 DIAGNOSIS — Z79899 Other long term (current) drug therapy: Secondary | ICD-10-CM | POA: Diagnosis not present

## 2019-05-26 DIAGNOSIS — R109 Unspecified abdominal pain: Secondary | ICD-10-CM | POA: Diagnosis not present

## 2019-05-26 DIAGNOSIS — Z853 Personal history of malignant neoplasm of breast: Secondary | ICD-10-CM | POA: Insufficient documentation

## 2019-05-26 DIAGNOSIS — D72829 Elevated white blood cell count, unspecified: Secondary | ICD-10-CM | POA: Diagnosis not present

## 2019-05-26 DIAGNOSIS — Z9079 Acquired absence of other genital organ(s): Secondary | ICD-10-CM | POA: Diagnosis not present

## 2019-05-26 DIAGNOSIS — Z87891 Personal history of nicotine dependence: Secondary | ICD-10-CM | POA: Diagnosis not present

## 2019-05-26 DIAGNOSIS — Z8261 Family history of arthritis: Secondary | ICD-10-CM | POA: Diagnosis not present

## 2019-05-26 DIAGNOSIS — Z170 Estrogen receptor positive status [ER+]: Secondary | ICD-10-CM | POA: Diagnosis not present

## 2019-05-26 DIAGNOSIS — R6881 Early satiety: Secondary | ICD-10-CM | POA: Diagnosis not present

## 2019-05-26 DIAGNOSIS — Z90722 Acquired absence of ovaries, bilateral: Secondary | ICD-10-CM | POA: Insufficient documentation

## 2019-05-26 LAB — CBC WITH DIFFERENTIAL/PLATELET
Abs Immature Granulocytes: 0.18 10*3/uL — ABNORMAL HIGH (ref 0.00–0.07)
Basophils Absolute: 0.1 10*3/uL (ref 0.0–0.1)
Basophils Relative: 1 %
Eosinophils Absolute: 0.2 10*3/uL (ref 0.0–0.5)
Eosinophils Relative: 1 %
HCT: 27 % — ABNORMAL LOW (ref 36.0–46.0)
Hemoglobin: 8.6 g/dL — ABNORMAL LOW (ref 12.0–15.0)
Immature Granulocytes: 2 %
Lymphocytes Relative: 39 %
Lymphs Abs: 4.6 10*3/uL — ABNORMAL HIGH (ref 0.7–4.0)
MCH: 28.5 pg (ref 26.0–34.0)
MCHC: 31.9 g/dL (ref 30.0–36.0)
MCV: 89.4 fL (ref 80.0–100.0)
Monocytes Absolute: 0.5 10*3/uL (ref 0.1–1.0)
Monocytes Relative: 4 %
Neutro Abs: 6.4 10*3/uL (ref 1.7–7.7)
Neutrophils Relative %: 53 %
Platelets: 64 10*3/uL — ABNORMAL LOW (ref 150–400)
RBC: 3.02 MIL/uL — ABNORMAL LOW (ref 3.87–5.11)
RDW: 23.9 % — ABNORMAL HIGH (ref 11.5–15.5)
WBC: 12 10*3/uL — ABNORMAL HIGH (ref 4.0–10.5)
nRBC: 1.1 % — ABNORMAL HIGH (ref 0.0–0.2)

## 2019-05-26 NOTE — Telephone Encounter (Signed)
Spoke to patient regarding the significant drop in hemoglobin/dropping platelets. I would recommend in clinic appointment tomorrow.  C-switch the patient appointment to in clinic tomorrow- MD; labs [ordered]. Pt will need to be reminded of labs.

## 2019-05-27 ENCOUNTER — Inpatient Hospital Stay: Payer: No Typology Code available for payment source

## 2019-05-27 ENCOUNTER — Inpatient Hospital Stay (HOSPITAL_BASED_OUTPATIENT_CLINIC_OR_DEPARTMENT_OTHER): Payer: No Typology Code available for payment source | Admitting: Internal Medicine

## 2019-05-27 VITALS — BP 114/75 | HR 80 | Temp 97.2°F | Wt 190.0 lb

## 2019-05-27 DIAGNOSIS — R1013 Epigastric pain: Secondary | ICD-10-CM

## 2019-05-27 DIAGNOSIS — D696 Thrombocytopenia, unspecified: Secondary | ICD-10-CM

## 2019-05-27 LAB — CBC WITH DIFFERENTIAL/PLATELET
Abs Immature Granulocytes: 0.1 10*3/uL — ABNORMAL HIGH (ref 0.00–0.07)
Basophils Absolute: 0 10*3/uL (ref 0.0–0.1)
Basophils Relative: 0 %
Eosinophils Absolute: 0.1 10*3/uL (ref 0.0–0.5)
Eosinophils Relative: 1 %
HCT: 25.9 % — ABNORMAL LOW (ref 36.0–46.0)
Hemoglobin: 8.5 g/dL — ABNORMAL LOW (ref 12.0–15.0)
Lymphocytes Relative: 41 %
Lymphs Abs: 5.5 10*3/uL — ABNORMAL HIGH (ref 0.7–4.0)
MCH: 29.1 pg (ref 26.0–34.0)
MCHC: 32.8 g/dL (ref 30.0–36.0)
MCV: 88.7 fL (ref 80.0–100.0)
Monocytes Absolute: 0.4 10*3/uL (ref 0.1–1.0)
Monocytes Relative: 3 %
Myelocytes: 1 %
Neutro Abs: 7.3 10*3/uL (ref 1.7–7.7)
Neutrophils Relative %: 54 %
Platelets: 72 10*3/uL — ABNORMAL LOW (ref 150–400)
RBC: 2.92 MIL/uL — ABNORMAL LOW (ref 3.87–5.11)
RDW: 23.9 % — ABNORMAL HIGH (ref 11.5–15.5)
Smear Review: DECREASED
WBC: 13.5 10*3/uL — ABNORMAL HIGH (ref 4.0–10.5)
nRBC: 1.3 % — ABNORMAL HIGH (ref 0.0–0.2)

## 2019-05-27 LAB — COMPREHENSIVE METABOLIC PANEL
ALT: 38 U/L (ref 0–44)
AST: 46 U/L — ABNORMAL HIGH (ref 15–41)
Albumin: 3.9 g/dL (ref 3.5–5.0)
Alkaline Phosphatase: 309 U/L — ABNORMAL HIGH (ref 38–126)
Anion gap: 7 (ref 5–15)
BUN: 14 mg/dL (ref 6–20)
CO2: 24 mmol/L (ref 22–32)
Calcium: 8.8 mg/dL — ABNORMAL LOW (ref 8.9–10.3)
Chloride: 107 mmol/L (ref 98–111)
Creatinine, Ser: 0.81 mg/dL (ref 0.44–1.00)
GFR calc Af Amer: >60 mL/min (ref >60)
GFR calc non Af Amer: >60 mL/min (ref >60)
Glucose, Bld: 95 mg/dL (ref 70–99)
Potassium: 3.9 mmol/L (ref 3.5–5.1)
Sodium: 138 mmol/L (ref 135–145)
Total Bilirubin: 1.1 mg/dL (ref 0.3–1.2)
Total Protein: 6.5 g/dL (ref 6.5–8.1)

## 2019-05-27 LAB — RETICULOCYTES
Immature Retic Fract: 34.7 % — ABNORMAL HIGH (ref 2.3–15.9)
RBC.: 2.65 MIL/uL — ABNORMAL LOW (ref 3.87–5.11)
Retic Count, Absolute: 275.5 10*3/uL — ABNORMAL HIGH (ref 19.0–186.0)
Retic Ct Pct: 10.6 % — ABNORMAL HIGH (ref 0.4–3.1)

## 2019-05-27 LAB — FERRITIN: Ferritin: 309 ng/mL — ABNORMAL HIGH (ref 11–307)

## 2019-05-27 LAB — IRON AND TIBC
Iron: 89 ug/dL (ref 28–170)
Saturation Ratios: 28 % (ref 10.4–31.8)
TIBC: 323 ug/dL (ref 250–450)
UIBC: 234 ug/dL

## 2019-05-27 LAB — TECHNOLOGIST SMEAR REVIEW

## 2019-05-27 LAB — LACTATE DEHYDROGENASE: LDH: 249 U/L — ABNORMAL HIGH (ref 98–192)

## 2019-05-27 NOTE — Progress Notes (Signed)
[place] NOTE  Patient Care Team: Malfi, Lupita Raider, FNP as PCP - General (Family Medicine)  CHIEF COMPLAINTS/PURPOSE OF CONSULTATION: Thrombocytopenia  Oncology History  Cancer of left breast ([place])  07/05/2008 Initial Diagnosis   History is obtained through chart review from Dr. Metro Kung notes. She was originally diagnosed with breast cancer, invasive lobular carcinoma in May 2010. She was placed on the protocol and received neoadjuvant chemotherapy. She had left mastectomy with reconstruction surgery and right breast augmentation with implant. Subsequently she was placed on tamoxifen until July of 2012, then Femara 2.5 mg.  Femara was stopped because of bony pains July of 2013. She was then switched to Aromasin. Patient has stopped taking Aromasin because of perceived side effect. In total, she had received anti-estrogen therapy for almost 4 years   10/17/2010 Surgery   She had bilateral salpingo-oophorectomy   01/21/2016 Imaging   No MRI evidence of malignancy in the right breast or reconstructed left breast (TRAM flap).       HEMATOLOGY HISTORY  # THROMBOCYTOPENIA- likely ITP [platelets- 2020 sep 97-99; 2018-N]; WBC/ Hb-N; OCT 2020- HIV/HEP-NEG; No BMB.   #History of breast cancer stage II ER/PR positive [2010;]  HISTORY OF PRESENTING ILLNESS:  Natasha Dennis 54 y.o.  female pleasant patient is here for follow-up of thrombocytopenia.  Patient notes to have abdominal discomfort epigastric pain for the last 2 to 3 months.  She also complains of weight loss.  She denies any nosebleeds or gum bleeding.  Complains of early satiety.  Denies any fevers.  Denies any chills.  Denies any night sweats.  Complains of worsening fatigue.  Patient has been going through severe anxiety given the loss of her son.  Her diarrhea which was attributed to anxiety is improved on Paxil.     Review of Systems  Constitutional: Positive for malaise/fatigue. Negative for  chills, diaphoresis, fever and weight loss.  HENT: Negative for nosebleeds and sore throat.   Eyes: Negative for double vision.  Respiratory: Negative for cough, hemoptysis, sputum production, shortness of breath and wheezing.   Cardiovascular: Negative for chest pain, palpitations, orthopnea and leg swelling.  Gastrointestinal: Positive for abdominal pain. Negative for blood in stool, constipation, diarrhea, heartburn, melena, nausea and vomiting.  Genitourinary: Negative for dysuria, frequency and urgency.  Musculoskeletal: Negative for back pain and joint pain.  Skin: Negative.  Negative for itching and rash.  Neurological: Negative for dizziness, tingling, focal weakness, weakness and headaches.  Endo/Heme/Allergies: Does not bruise/bleed easily.  Psychiatric/Behavioral: Negative for depression. The patient is not nervous/anxious and does not have insomnia.      MEDICAL HISTORY:  Past Medical History:  Diagnosis Date  . Breast cancer ([place]) 03-17-2009   lt Mastectomy  . Cancer ([place])   . GERD (gastroesophageal reflux disease)     SURGICAL HISTORY: Past Surgical History:  Procedure Laterality Date  . ABDOMINAL HYSTERECTOMY    . AUGMENTATION MAMMAPLASTY Right    RT BREAST IMPLANT ONLY  . MASTECTOMY Left 2011    SOCIAL HISTORY: Social History   Socioeconomic History  . Marital status: Married    Spouse name: Not on file  . Number of children: Not on file  . Years of education: Not on file  . Highest education level: Not on file  Occupational History  . Not on file  Tobacco Use  . Smoking status: Former Smoker    Quit date: 03/05/1984    Years since quitting: 35.2  . Smokeless tobacco: Never Used  Substance and Sexual Activity  . Alcohol use: No  . Drug use: No  . Sexual activity: Not on file  Other Topics Concern  . Not on file  Social History Narrative  . Not on file   Social Determinants of Health   Financial Resource Strain:   . Difficulty of Paying Living  Expenses:   Food Insecurity:   . Worried About Charity fundraiser in the Last Year:   . Arboriculturist in the Last Year:   Transportation Needs:   . Film/video editor (Medical):   Marland Kitchen Lack of Transportation (Non-Medical):   Physical Activity:   . Days of Exercise per Week:   . Minutes of Exercise per Session:   Stress:   . Feeling of Stress :   Social Connections:   . Frequency of Communication with Friends and Family:   . Frequency of Social Gatherings with Friends and Family:   . Attends Religious Services:   . Active Member of Clubs or Organizations:   . Attends Archivist Meetings:   Marland Kitchen Marital Status:   Intimate Partner Violence:   . Fear of Current or Ex-Partner:   . Emotionally Abused:   Marland Kitchen Physically Abused:   . Sexually Abused:     FAMILY HISTORY: Family History  Problem Relation Age of Onset  . Diabetes Mother   . Arthritis Mother   . Heart disease Father   . Breast cancer Neg Hx     ALLERGIES:  is allergic to influenza vaccines.  MEDICATIONS:  Current Outpatient Medications  Medication Sig Dispense Refill  . cholecalciferol (VITAMIN D) 1000 units tablet Take 1,000 Units by mouth daily.    Marland Kitchen dexlansoprazole (DEXILANT) 60 MG capsule Take 60 mg by mouth daily.    Marland Kitchen PARoxetine (PAXIL) 20 MG tablet Take 1 tablet (20 mg total) by mouth daily. 90 tablet 1  . ALPRAZolam (XANAX) 0.5 MG tablet Take 0.5-1 tablets (0.25-0.5 mg total) by mouth at bedtime as needed for anxiety. (Patient not taking: Reported on 05/27/2019) 10 tablet 0   No current facility-administered medications for this visit.     Marland Kitchen  PHYSICAL EXAMINATION:   Vitals:   05/27/19 1454  BP: 114/75  Pulse: 80  Temp: (!) 97.2 F (36.2 C)   Filed Weights   05/27/19 1454  Weight: 190 lb (86.2 kg)    Physical Exam  Constitutional: She is oriented to person, place, and time and well-developed, well-nourished, and in no distress.  HENT:  Head: Normocephalic and atraumatic.   Mouth/Throat: Oropharynx is clear and moist. No oropharyngeal exudate.  Eyes: Pupils are equal, round, and reactive to light.  Cardiovascular: Normal rate and regular rhythm.  Pulmonary/Chest: Effort normal and breath sounds normal. No respiratory distress. She has no wheezes.  Abdominal: Soft. Bowel sounds are normal. She exhibits no distension and no mass. There is no abdominal tenderness. There is no rebound and no guarding.  Abdominal discomfort on deep palpation  Musculoskeletal:        General: No tenderness or edema. Normal range of motion.     Cervical back: Normal range of motion and neck supple.  Neurological: She is alert and oriented to person, place, and time.  Skin: Skin is warm.  Psychiatric: Affect normal.     LABORATORY DATA:  I have reviewed the data as listed Lab Results  Component Value Date   WBC 13.5 (H) 05/27/2019   HGB 8.5 (L) 05/27/2019   HCT 25.9 (L) 05/27/2019  MCV 88.7 05/27/2019   PLT 72 (L) 05/27/2019   Recent Labs    10/21/18 1613 05/27/19 1435  NA 141 138  K 4.6 3.9  CL 104 107  CO2 22 24  GLUCOSE 93 95  BUN 14 14  CREATININE 0.86 0.81  CALCIUM 9.2 8.8*  GFRNONAA 77 >60  GFRAA 89 >60  PROT 6.4 6.5  ALBUMIN 4.2 3.9  AST 20 46*  ALT 13 38  ALKPHOS 124* 309*  BILITOT 0.4 1.1     No results found.  ASSESSMENT & PLAN:   Thrombocytopenia ([place]) #Initially isolated thrombocytopenia above 100; however repeat blood work-shows platelets 64; hemoglobin 8.4; leukocytosis of 12/nucleated RBC.  #I reviewed my concern with the patient on the change in blood counts as above.  Recommend a bone marrow biopsy ASAP.  #Abdominal discomfort/epigastric/early satiety-recommend CT scan of the abdomen pelvis stat.  Patient currently awaiting EGD/colonoscopy with GI.  Will discuss with Dr. Verl Blalock regarding timing of the endoscopies-in the context of above findings.  # Anxiety/loss of son: On antidepressant stable.  # DISPOSITION: # CT A/P STAT # Bone  marrow Biopsy asap.  # follow up TBD- Dr.B  All questions were answered. The patient knows to call the clinic with any problems, questions or concerns.  Thank you Dr. for allowing me to participate in the care of your pleasant patient. Please do not hesitate to contact me with questions or concerns in the interim.   Cammie Sickle, MD 05/27/2019 4:23 PM

## 2019-05-27 NOTE — Assessment & Plan Note (Addendum)
#  Initially isolated thrombocytopenia above 100; however repeat blood work-shows platelets 64; hemoglobin 8.4; leukocytosis of 12/nucleated RBC.  #I reviewed my concern with the patient on the change in blood counts as above.  Recommend a bone marrow biopsy ASAP.  #Abdominal discomfort/epigastric/early satiety-recommend CT scan of the abdomen pelvis stat.  Patient currently awaiting EGD/colonoscopy with GI.  Will discuss with Dr. Verl Blalock regarding timing of the endoscopies-in the context of above findings.  # Anxiety/loss of son: On antidepressant stable.  # DISPOSITION: # CT A/P STAT # Bone marrow Biopsy asap.  # follow up TBD- Dr.B

## 2019-05-28 ENCOUNTER — Telehealth: Payer: Self-pay | Admitting: *Deleted

## 2019-05-28 NOTE — Telephone Encounter (Signed)
Patient called and left vm on triage to inquire about covid testing. She is requesting covid testing prior to her bone marrow biposy.  I left a msg for patient- returning patient's phone call. No covid testing is needed for the bone marrow biopsy. If patient is having symptoms, she needs to contact our office back. Information provided for the community covid testing locations if she needs to make an apt for this. Otherwise, no covid testing is needed prior to the bone marrow biopsy.

## 2019-05-28 NOTE — Telephone Encounter (Signed)
Spoke with patient. Bone marrow biopsy date on 06/03/19 (arrival at 7:30 am). Patient informed to not eat/drink anything 6-8 hours prior to the procedure. She gave verbal understanding of the plan of care.

## 2019-05-29 ENCOUNTER — Telehealth: Payer: Self-pay | Admitting: *Deleted

## 2019-05-29 ENCOUNTER — Other Ambulatory Visit: Payer: Self-pay

## 2019-05-29 ENCOUNTER — Ambulatory Visit
Admission: RE | Admit: 2019-05-29 | Discharge: 2019-05-29 | Disposition: A | Discharge status: Home / Self Care | Payer: No Typology Code available for payment source | Source: Ambulatory Visit | Attending: Internal Medicine | Admitting: Internal Medicine

## 2019-05-29 DIAGNOSIS — R1013 Epigastric pain: Secondary | ICD-10-CM | POA: Diagnosis present

## 2019-05-29 LAB — COMP PANEL: LEUKEMIA/LYMPHOMA

## 2019-05-29 MED ORDER — IOHEXOL 300 MG/ML  SOLN
100.0000 mL | Freq: Once | INTRAMUSCULAR | Status: AC | PRN
  Administered 2019-05-29: 100 mL via INTRAVENOUS

## 2019-05-29 NOTE — Telephone Encounter (Signed)
Called report  IMPRESSION: 1. Interval development of innumerable sclerotic lesions throughout the axial and appendicular skeleton, consistent with bony metastatic disease. Bone scan may be useful for further evaluation. 2. Abnormal soft tissue density at the root of the mesentery, with loss of normal fat planes between the uncinate process of the pancreatic head/uncinate process and duodenal bulb. Circumferential wall thickening of the proximal duodenum as above. Overall, I would favor neoplasm rather than underlying inflammatory change such as pancreatitis. MRI with without contrast may be useful for further evaluation in this area. 3. Splenomegaly. 4. Numerous subcentimeter lymph nodes at the root of the mesentery, nonspecific. Given the bony findings and adjacent desmoplastic appearance of the mesentery, neoplasm cannot be excluded. PET-CT may be useful for further evaluation.  These results will be called to the ordering clinician or representative by the Radiologist Assistant, and communication documented in the PACS or [employer].   Electronically Signed   By: Randa Ngo M.D.   On: 05/29/2019 13:50

## 2019-06-01 ENCOUNTER — Telehealth: Payer: Self-pay | Admitting: Internal Medicine

## 2019-06-01 DIAGNOSIS — C8593 Non-Hodgkin lymphoma, unspecified, intra-abdominal lymph nodes: Secondary | ICD-10-CM

## 2019-06-01 NOTE — Telephone Encounter (Signed)
On 3/26-spoke to patient regarding the concerning results of the CT scan.  Suspicious for malignancy/lymphoma.  Proceed with bone marrow biopsy as planned. recommend a PET scan ASAP.  C-please schedule PET ASAP; follow-up with me 1 to 2 days later after the PET scan.    

## 2019-06-02 ENCOUNTER — Other Ambulatory Visit: Admission: RE | Admit: 2019-06-02 | Payer: No Typology Code available for payment source | Source: Ambulatory Visit

## 2019-06-02 ENCOUNTER — Other Ambulatory Visit: Payer: Self-pay | Admitting: Physician Assistant

## 2019-06-03 ENCOUNTER — Ambulatory Visit
Admission: RE | Admit: 2019-06-03 | Discharge: 2019-06-03 | Disposition: A | Discharge status: Home / Self Care | Payer: No Typology Code available for payment source | Source: Ambulatory Visit | Attending: Internal Medicine | Admitting: Internal Medicine

## 2019-06-03 ENCOUNTER — Other Ambulatory Visit: Payer: Self-pay

## 2019-06-03 ENCOUNTER — Other Ambulatory Visit: Payer: Self-pay | Admitting: Internal Medicine

## 2019-06-03 DIAGNOSIS — Z853 Personal history of malignant neoplasm of breast: Secondary | ICD-10-CM | POA: Insufficient documentation

## 2019-06-03 DIAGNOSIS — D696 Thrombocytopenia, unspecified: Secondary | ICD-10-CM | POA: Diagnosis not present

## 2019-06-03 DIAGNOSIS — D649 Anemia, unspecified: Secondary | ICD-10-CM | POA: Insufficient documentation

## 2019-06-03 DIAGNOSIS — C7951 Secondary malignant neoplasm of bone: Secondary | ICD-10-CM | POA: Insufficient documentation

## 2019-06-03 DIAGNOSIS — Z9889 Other specified postprocedural states: Secondary | ICD-10-CM

## 2019-06-03 HISTORY — DX: Other specified postprocedural states: Z98.890

## 2019-06-03 LAB — CBC WITH DIFFERENTIAL/PLATELET
Abs Immature Granulocytes: 0.16 10*3/uL — ABNORMAL HIGH (ref 0.00–0.07)
Basophils Absolute: 0.1 10*3/uL (ref 0.0–0.1)
Basophils Relative: 1 %
Eosinophils Absolute: 0.2 10*3/uL (ref 0.0–0.5)
Eosinophils Relative: 1 %
HCT: 25.7 % — ABNORMAL LOW (ref 36.0–46.0)
Hemoglobin: 8.4 g/dL — ABNORMAL LOW (ref 12.0–15.0)
Immature Granulocytes: 1 %
Lymphocytes Relative: 38 %
Lymphs Abs: 4.5 10*3/uL — ABNORMAL HIGH (ref 0.7–4.0)
MCH: 29.2 pg (ref 26.0–34.0)
MCHC: 32.7 g/dL (ref 30.0–36.0)
MCV: 89.2 fL (ref 80.0–100.0)
Monocytes Absolute: 0.6 10*3/uL (ref 0.1–1.0)
Monocytes Relative: 5 %
Neutro Abs: 6.4 10*3/uL (ref 1.7–7.7)
Neutrophils Relative %: 54 %
Platelets: 77 10*3/uL — ABNORMAL LOW (ref 150–400)
RBC: 2.88 MIL/uL — ABNORMAL LOW (ref 3.87–5.11)
RDW: 23.9 % — ABNORMAL HIGH (ref 11.5–15.5)
WBC: 12 10*3/uL — ABNORMAL HIGH (ref 4.0–10.5)
nRBC: 1.3 % — ABNORMAL HIGH (ref 0.0–0.2)

## 2019-06-03 MED ORDER — MIDAZOLAM HCL 5 MG/5ML IJ SOLN
INTRAMUSCULAR | Status: AC
  Filled 2019-06-03: qty 5

## 2019-06-03 MED ORDER — FENTANYL CITRATE (PF) 100 MCG/2ML IJ SOLN
INTRAMUSCULAR | Status: AC | PRN
  Administered 2019-06-03: 50 ug via INTRAVENOUS

## 2019-06-03 MED ORDER — FENTANYL CITRATE (PF) 100 MCG/2ML IJ SOLN
INTRAMUSCULAR | Status: AC
  Filled 2019-06-03: qty 2

## 2019-06-03 MED ORDER — SODIUM CHLORIDE 0.9 % IV SOLN: INTRAVENOUS | Status: DC

## 2019-06-03 MED ORDER — MIDAZOLAM HCL 5 MG/5ML IJ SOLN
INTRAMUSCULAR | Status: AC | PRN
  Administered 2019-06-03: 1 mg via INTRAVENOUS

## 2019-06-03 MED ORDER — HEPARIN SOD (PORK) LOCK FLUSH 100 UNIT/ML IV SOLN
INTRAVENOUS | Status: AC
  Filled 2019-06-03: qty 5

## 2019-06-03 NOTE — Consult Note (Signed)
Chief Complaint: Patient was seen in consultation today for CT-guided bone marrow biopsy at the request of Brahmanday,Govinda R  Referring Physician(s): Brahmanday,Govinda R  Patient Status: [facility] - Out-pt  History of Present Illness: Natasha Dennis is a 54 y.o. female with history of left breast cancer from 2010.  Patient has been complaining of vague abdominal distention and pain.  Patient has thrombocytopenia.  She denies shortness of breath or chest pain.  She denies bowel or urinary symptoms.  Recent CT imaging demonstrated diffuse sclerotic lesions throughout the bones compatible with metastatic disease.  In addition, there is evidence for splenomegaly and poorly defined soft tissue near the root of the mesentery.  Patient presents for a CT-guided bone marrow biopsy.  Past Medical History:  Diagnosis Date  . Breast cancer ([place]) 03-17-2009   lt Mastectomy  . Cancer ([place])   . GERD (gastroesophageal reflux disease)   . History of bone marrow biopsy 06/03/2019    Past Surgical History:  Procedure Laterality Date  . ABDOMINAL HYSTERECTOMY    . AUGMENTATION MAMMAPLASTY Right    RT BREAST IMPLANT ONLY  . MASTECTOMY Left 2011    Allergies: Influenza vaccines  Medications: Prior to Admission medications   Medication Sig Start Date End Date Taking? Authorizing Provider  ALPRAZolam Duanne Moron) 0.5 MG tablet Take 0.5-1 tablets (0.25-0.5 mg total) by mouth at bedtime as needed for anxiety. 01/28/19  Yes Karamalegos, Devonne Doughty, DO  cholecalciferol (VITAMIN D) 1000 units tablet Take 1,000 Units by mouth daily.   Yes [provider]  dexlansoprazole (DEXILANT) 60 MG capsule Take 60 mg by mouth daily.   Yes [provider]  PARoxetine (PAXIL) 20 MG tablet Take 1 tablet (20 mg total) by mouth daily. 05/11/19  Yes Malfi, Lupita Raider, FNP     Family History  Problem Relation Age of Onset  . Diabetes Mother   . Arthritis Mother   . Heart disease Father   . Breast  cancer Neg Hx     Social History   Socioeconomic History  . Marital status: Married    Spouse name: Not on file  . Number of children: Not on file  . Years of education: Not on file  . Highest education level: Not on file  Occupational History  . Not on file  Tobacco Use  . Smoking status: Former Smoker    Quit date: 03/05/1984    Years since quitting: 35.2  . Smokeless tobacco: Never Used  Substance and Sexual Activity  . Alcohol use: No  . Drug use: No  . Sexual activity: Not on file  Other Topics Concern  . Not on file  Social History Narrative  . Not on file   Social Determinants of Health   Financial Resource Strain:   . Difficulty of Paying Living Expenses:   Food Insecurity:   . Worried About Charity fundraiser in the Last Year:   . Arboriculturist in the Last Year:   Transportation Needs:   . Film/video editor (Medical):   Marland Kitchen Lack of Transportation (Non-Medical):   Physical Activity:   . Days of Exercise per Week:   . Minutes of Exercise per Session:   Stress:   . Feeling of Stress :   Social Connections:   . Frequency of Communication with Friends and Family:   . Frequency of Social Gatherings with Friends and Family:   . Attends Religious Services:   . Active Member of Clubs or Organizations:   .  Attends Archivist Meetings:   Marland Kitchen Marital Status:      Review of Systems: A 12 point ROS discussed and pertinent positives are indicated in the HPI above.  All other systems are negative.  Review of Systems  Respiratory: Negative.   Cardiovascular: Negative.   Gastrointestinal: Positive for abdominal distention and abdominal pain.  Genitourinary: Negative.     Vital Signs: BP 116/71   Pulse 83   Temp 98.4 F (36.9 C)   Resp 20   Ht '5\' 5"'  (1.651 m)   Wt 86.2 kg   SpO2 97%   BMI 31.62 kg/m   Physical Exam Constitutional:      Appearance: Normal appearance. She is not ill-appearing.  Cardiovascular:     Rate and Rhythm: Regular  rhythm.     Heart sounds: Normal heart sounds.  Pulmonary:     Effort: Pulmonary effort is normal.     Breath sounds: Normal breath sounds.  Abdominal:     General: Abdomen is flat. Bowel sounds are normal.     Palpations: Abdomen is soft.  Neurological:     Mental Status: She is alert.     Imaging: CT ABDOMEN PELVIS W CONTRAST  Result Date: 05/29/2019 CLINICAL DATA:  Mid abdominal pain for several months, palpable abnormality, loose stool, personal history of left breast cancer status post mastectomy EXAM: CT ABDOMEN AND PELVIS WITH CONTRAST TECHNIQUE: Multidetector CT imaging of the abdomen and pelvis was performed using the standard protocol following bolus administration of intravenous contrast. CONTRAST:  167m OMNIPAQUE IOHEXOL 300 MG/ML  SOLN COMPARISON:  08/19/2010 FINDINGS: Lower chest: No acute pleural or parenchymal lung disease. Hepatobiliary: Gallbladder is decompressed, limiting evaluation. Enhancement of the gallbladder mucosa is noted. No evidence of cholelithiasis. No pericholecystic fat stranding. Liver is unremarkable. Pancreas: There is abnormal soft tissue density in fat stranding at the root of mesentery extending to the uncinate process of the pancreas. There is adjacent wall thickening within the duodenal bulb. No discrete mass can be identified. Spleen: Spleen is enlarged measuring 15.2 cm in craniocaudal length. No focal parenchymal abnormality. Adrenals/Urinary Tract: The adrenals appear normal. Kidneys enhance normally and symmetrically. No urinary tract calculi. The bladder is unremarkable. Stomach/Bowel: As stated above, there is circumferential wall thickening of the duodenal bulb, best seen on axial images thirty-three through 42 and on coronal image 55 through 61. There is loss of normal fat plane between the proximal duodenum and pancreatic head. I do not see any bowel obstruction or ileus. Vascular/Lymphatic: Numerous small lymph nodes are seen at the root of the  mesentery, at the area of mesenteric stranding, ill-defined uncinate process of the pancreas, and circumferential duodenal wall thickening. Lymph nodes are all less than 1 cm in size. Vascular structures are grossly unremarkable. No significant aortic atherosclerosis. The splenic vein, portal vein, and superior mesenteric vein enhance normally. Reproductive: Status post hysterectomy. No adnexal masses. Other: No free fluid or free gas.  No abdominal wall hernia. Musculoskeletal: Diffuse sclerotic lesions are seen throughout the axial and appendicular skeleton, consistent with bony metastatic disease. No pathologic fracture. Reconstructed images demonstrate no additional findings. IMPRESSION: 1. Interval development of innumerable sclerotic lesions throughout the axial and appendicular skeleton, consistent with bony metastatic disease. Bone scan may be useful for further evaluation. 2. Abnormal soft tissue density at the root of the mesentery, with loss of normal fat planes between the uncinate process of the pancreatic head/uncinate process and duodenal bulb. Circumferential wall thickening of the proximal duodenum as above.  Overall, I would favor neoplasm rather than underlying inflammatory change such as pancreatitis. MRI with without contrast may be useful for further evaluation in this area. 3. Splenomegaly. 4. Numerous subcentimeter lymph nodes at the root of the mesentery, nonspecific. Given the bony findings and adjacent desmoplastic appearance of the mesentery, neoplasm cannot be excluded. PET-CT may be useful for further evaluation. These results will be called to the ordering clinician or representative by the Radiologist Assistant, and communication documented in the PACS or [employer]. Electronically Signed   By: Randa Ngo M.D.   On: 05/29/2019 13:50    Labs:  CBC: Recent Labs    01/16/19 1106 01/28/19 0928 05/26/19 1141 05/27/19 1435  WBC 10.1 10.4 12.0* 13.5*  HGB 12.2 12.2 8.6*  8.5*  HCT 38.2 38.3 27.0* 25.9*  PLT 94* 104* 64* 72*    COAGS: No results for input(s): INR, APTT in the last 8760 hours.  BMP: Recent Labs    10/21/18 1613 05/27/19 1435  NA 141 138  K 4.6 3.9  CL 104 107  CO2 22 24  GLUCOSE 93 95  BUN 14 14  CALCIUM 9.2 8.8*  CREATININE 0.86 0.81  GFRNONAA 77 >60  GFRAA 89 >60    LIVER FUNCTION TESTS: Recent Labs    10/21/18 1613 05/27/19 1435  BILITOT 0.4 1.1  AST 20 46*  ALT 13 38  ALKPHOS 124* 309*  PROT 6.4 6.5  ALBUMIN 4.2 3.9    TUMOR MARKERS: No results for input(s): AFPTM, CEA, CA199, CHROMGRNA in the last 8760 hours.  Assessment and Plan:  54 year old with thrombocytopenia and evidence for metastatic disease based on recent CT imaging.  After reviewing the recent CT imaging, the metastatic bone disease is involving the bilateral iliac bones.  Plan for CT-guided bone marrow biopsy.    Risks and benefits of CT guided bone marrow biopsy was discussed with the patient and/or patient's family including, but not limited to bleeding, infection, damage to adjacent structures or low yield requiring additional tests.  All of the questions were answered and there is agreement to proceed.  Consent signed and in chart.    Thank you for this interesting consult.  I greatly enjoyed meeting Natasha Dennis and look forward to participating in their care.  A copy of this report was sent to the requesting provider on this date.  Electronically Signed: Burman Riis, MD 06/03/2019, 8:07 AM   I spent a total of  15 Minutes   in face to face in clinical consultation, greater than 50% of which was counseling/coordinating care for bone marrow biopsy.

## 2019-06-03 NOTE — Procedures (Signed)
Interventional Radiology Procedure:   Indications: Thrombocytopenia and bone lesions  Procedure: CT guided bone marrow biopsy  Findings: Dry aspirate, no blood obtained.  2 cores from right ilium.   Complications: None     EBL: Minimal, less than 10 ml  Plan: Discharge to home in one hour.   Wood Novacek R. Anselm Pancoast, MD  Pager: [phone]

## 2019-06-04 ENCOUNTER — Ambulatory Visit: Admit: 2019-06-04 | Payer: No Typology Code available for payment source | Admitting: Gastroenterology

## 2019-06-04 SURGERY — ESOPHAGOGASTRODUODENOSCOPY (EGD) WITH PROPOFOL
Anesthesia: General

## 2019-06-05 LAB — SURGICAL PATHOLOGY

## 2019-06-08 ENCOUNTER — Encounter
Admission: RE | Admit: 2019-06-08 | Discharge: 2019-06-08 | Disposition: A | Discharge status: Home / Self Care | Payer: No Typology Code available for payment source | Source: Ambulatory Visit | Attending: Internal Medicine | Admitting: Internal Medicine

## 2019-06-08 ENCOUNTER — Other Ambulatory Visit: Payer: Self-pay

## 2019-06-08 ENCOUNTER — Other Ambulatory Visit: Payer: Self-pay | Admitting: Internal Medicine

## 2019-06-08 DIAGNOSIS — C8593 Non-Hodgkin lymphoma, unspecified, intra-abdominal lymph nodes: Secondary | ICD-10-CM | POA: Insufficient documentation

## 2019-06-08 LAB — GLUCOSE, CAPILLARY: Glucose-Capillary: 96 mg/dL (ref 70–99)

## 2019-06-08 MED ORDER — FLUDEOXYGLUCOSE F - 18 (FDG) INJECTION
9.8000 | Freq: Once | INTRAVENOUS | Status: AC | PRN
  Administered 2019-06-08: 09:00:00 9.99 via INTRAVENOUS

## 2019-06-08 NOTE — Progress Notes (Signed)
Please keep [place] updated.  We will be glad to reach out to hear.  We know her well.   Thanks, [employer]

## 2019-06-08 NOTE — Progress Notes (Signed)
Discussed with Dr.Rubinas; pathology- metastatic carcinoma. ? Breast.  Added to MDT. PET scan today.will reach out to patient later in the afternoon today. a  FYI-

## 2019-06-09 ENCOUNTER — Telehealth: Payer: Self-pay | Admitting: Internal Medicine

## 2019-06-09 ENCOUNTER — Encounter: Payer: Self-pay | Admitting: Internal Medicine

## 2019-06-09 ENCOUNTER — Inpatient Hospital Stay: Payer: No Typology Code available for payment source

## 2019-06-09 ENCOUNTER — Inpatient Hospital Stay: Payer: No Typology Code available for payment source | Attending: Internal Medicine | Admitting: Internal Medicine

## 2019-06-09 DIAGNOSIS — Z90721 Acquired absence of ovaries, unilateral: Secondary | ICD-10-CM | POA: Diagnosis not present

## 2019-06-09 DIAGNOSIS — C50912 Malignant neoplasm of unspecified site of left female breast: Secondary | ICD-10-CM

## 2019-06-09 DIAGNOSIS — Z170 Estrogen receptor positive status [ER+]: Secondary | ICD-10-CM | POA: Insufficient documentation

## 2019-06-09 DIAGNOSIS — Z9079 Acquired absence of other genital organ(s): Secondary | ICD-10-CM | POA: Diagnosis not present

## 2019-06-09 DIAGNOSIS — Z7189 Other specified counseling: Secondary | ICD-10-CM | POA: Insufficient documentation

## 2019-06-09 DIAGNOSIS — Z8261 Family history of arthritis: Secondary | ICD-10-CM | POA: Insufficient documentation

## 2019-06-09 DIAGNOSIS — R109 Unspecified abdominal pain: Secondary | ICD-10-CM | POA: Diagnosis not present

## 2019-06-09 DIAGNOSIS — Z79811 Long term (current) use of aromatase inhibitors: Secondary | ICD-10-CM | POA: Insufficient documentation

## 2019-06-09 DIAGNOSIS — Z9012 Acquired absence of left breast and nipple: Secondary | ICD-10-CM | POA: Insufficient documentation

## 2019-06-09 DIAGNOSIS — Z90710 Acquired absence of both cervix and uterus: Secondary | ICD-10-CM | POA: Diagnosis not present

## 2019-06-09 DIAGNOSIS — Z833 Family history of diabetes mellitus: Secondary | ICD-10-CM | POA: Diagnosis not present

## 2019-06-09 DIAGNOSIS — Z8249 Family history of ischemic heart disease and other diseases of the circulatory system: Secondary | ICD-10-CM | POA: Insufficient documentation

## 2019-06-09 DIAGNOSIS — Z79899 Other long term (current) drug therapy: Secondary | ICD-10-CM | POA: Diagnosis not present

## 2019-06-09 DIAGNOSIS — Z87891 Personal history of nicotine dependence: Secondary | ICD-10-CM | POA: Insufficient documentation

## 2019-06-09 DIAGNOSIS — D696 Thrombocytopenia, unspecified: Secondary | ICD-10-CM | POA: Diagnosis not present

## 2019-06-09 DIAGNOSIS — C7951 Secondary malignant neoplasm of bone: Secondary | ICD-10-CM | POA: Diagnosis not present

## 2019-06-09 DIAGNOSIS — D649 Anemia, unspecified: Secondary | ICD-10-CM | POA: Diagnosis not present

## 2019-06-09 DIAGNOSIS — Z9221 Personal history of antineoplastic chemotherapy: Secondary | ICD-10-CM | POA: Insufficient documentation

## 2019-06-09 LAB — COMPREHENSIVE METABOLIC PANEL
ALT: 25 U/L (ref 0–44)
AST: 42 U/L — ABNORMAL HIGH (ref 15–41)
Albumin: 3.8 g/dL (ref 3.5–5.0)
Alkaline Phosphatase: 334 U/L — ABNORMAL HIGH (ref 38–126)
Anion gap: 6 (ref 5–15)
BUN: 9 mg/dL (ref 6–20)
CO2: 24 mmol/L (ref 22–32)
Calcium: 8.5 mg/dL — ABNORMAL LOW (ref 8.9–10.3)
Chloride: 108 mmol/L (ref 98–111)
Creatinine, Ser: 0.73 mg/dL (ref 0.44–1.00)
GFR calc Af Amer: >60 mL/min (ref >60)
GFR calc non Af Amer: >60 mL/min (ref >60)
Glucose, Bld: 95 mg/dL (ref 70–99)
Potassium: 3.7 mmol/L (ref 3.5–5.1)
Sodium: 138 mmol/L (ref 135–145)
Total Bilirubin: 1.7 mg/dL — ABNORMAL HIGH (ref 0.3–1.2)
Total Protein: 6.6 g/dL (ref 6.5–8.1)

## 2019-06-09 LAB — URINALYSIS, COMPLETE (UACMP) WITH MICROSCOPIC
Bilirubin Urine: NEGATIVE
Glucose, UA: NEGATIVE mg/dL
Hgb urine dipstick: NEGATIVE
Ketones, ur: NEGATIVE mg/dL
Nitrite: POSITIVE — AB
Protein, ur: NEGATIVE mg/dL
Specific Gravity, Urine: 1.016 (ref 1.005–1.030)
pH: 5 (ref 5.0–8.0)

## 2019-06-09 LAB — CBC WITH DIFFERENTIAL/PLATELET
Abs Immature Granulocytes: 0.22 10*3/uL — ABNORMAL HIGH (ref 0.00–0.07)
Basophils Absolute: 0.1 10*3/uL (ref 0.0–0.1)
Basophils Relative: 1 %
Eosinophils Absolute: 0.2 10*3/uL (ref 0.0–0.5)
Eosinophils Relative: 1 %
HCT: 25.8 % — ABNORMAL LOW (ref 36.0–46.0)
Hemoglobin: 8.1 g/dL — ABNORMAL LOW (ref 12.0–15.0)
Immature Granulocytes: 2 %
Lymphocytes Relative: 40 %
Lymphs Abs: 5.9 10*3/uL — ABNORMAL HIGH (ref 0.7–4.0)
MCH: 29.3 pg (ref 26.0–34.0)
MCHC: 31.4 g/dL (ref 30.0–36.0)
MCV: 93.5 fL (ref 80.0–100.0)
Monocytes Absolute: 0.7 10*3/uL (ref 0.1–1.0)
Monocytes Relative: 5 %
Neutro Abs: 7.6 10*3/uL (ref 1.7–7.7)
Neutrophils Relative %: 51 %
Platelets: 83 10*3/uL — ABNORMAL LOW (ref 150–400)
RBC: 2.76 MIL/uL — ABNORMAL LOW (ref 3.87–5.11)
RDW: 24.1 % — ABNORMAL HIGH (ref 11.5–15.5)
WBC: 14.6 10*3/uL — ABNORMAL HIGH (ref 4.0–10.5)
nRBC: 2 % — ABNORMAL HIGH (ref 0.0–0.2)

## 2019-06-09 MED ORDER — LETROZOLE 2.5 MG PO TABS: 2.5000 mg | ORAL_TABLET | Freq: Every day | ORAL | 0 refills | Status: DC

## 2019-06-09 NOTE — Assessment & Plan Note (Addendum)
#  Metastatic carcinoma on bone marrow biopsy; suggestive of recurrent breast cancer/lobular cancer-causing anemia thrombocytopenia.  Awaiting PET scan  # Abdominal discomfort/epigastric/early satiety- CT scan of the abdomen pelvis-vague infiltrative lesion noted duodenum/pancreatic head.  Again highly suspicious of malignancy.  Awaiting PET scan.  Discussed that given the lack of enough tissue on bone marrow biopsy-we will likely need repeat biopsy.  Will review at tumor conference.  #Discussed the high clinical suspicion for metastatic breast cancer-I would recommend starting the patient on Femara; new prescription sent.  Recommend taking calcium plus vitamin D and Osteo Bi-Flex.  Also discussed if ER/PR HER-2/neu status is confirmed-recommend Faslodex plus CDK inhibitor.  However patient's thrombocytopenia could be limiting.   # Bone metastases- recommend NSAIDs.  Discuss bisphosphonate therapy.  # DISPOSITION: # labs today- cbc/cmp/ca-15-3; ca 27-29; cea; UA and culture # follow up in 1 week-MD; No labs- Dr.B  Addendum: PET scan results available after patient left-left axial adenopathy; multiple sclerotic bone lesions; no uptake noted in the abdomen pelvis.  Will discuss with patient/family.

## 2019-06-09 NOTE — Telephone Encounter (Signed)
On 4/05-spoke to patient regarding results of the bone marrow biopsy suggestive of involvement carcinoma.  Clinically suggestive of breast origin.  Awaiting PET scan for further work-up.  Follow-up with me as planned.  GB

## 2019-06-09 NOTE — Progress Notes (Signed)
[facility] CONSULT NOTE  Patient Care Team: Malfi, Lupita Raider, FNP as PCP - General (Family Medicine)  CHIEF COMPLAINTS/PURPOSE OF CONSULTATION: Thrombocytopenia/anemia  Oncology History Overview Note  #  May 2010-stage II lobular cancer left breast; [Dr.Choksi] on a protocol received neoadjuvant chemotherapy s/p mastectomy reconstruction/TRAM; right breast augmentation.  Initially tamoxifen until July 2012-then Femara 2.5 mg; stopped because of joint pains; July 2013-Aromasin again stopped because of joint pains [total of 4 years-endocrine therapy].  #Bilateral salpingo-oophorectomy.   #March/April 2021-thrombocytopenia/worsening anemia-bone marrow biopsy-"carcinoma".  ER/PR status HER-2/neu status-NA; March 2021 abdomen pelvis CT scan-vague infiltrative lesion noted duodenal area; June 08, 2019-PET scan left axilla adenopathy; multiple bone lesions.    Primary cancer of left breast with metastasis to other site [facility])  07/05/2008 Initial Diagnosis   History is obtained through chart review from Dr. Metro Kung notes. She was originally diagnosed with breast cancer, invasive lobular carcinoma in May 2010. She was placed on the protocol and received neoadjuvant chemotherapy. She had left mastectomy with reconstruction surgery and right breast augmentation with implant. Subsequently she was placed on tamoxifen until July of 2012, then Femara 2.5 mg.  Femara was stopped because of bony pains July of 2013. She was then switched to Aromasin. Patient has stopped taking Aromasin because of perceived side effect. In total, she had received anti-estrogen therapy for almost 4 years   10/17/2010 Surgery   She had bilateral salpingo-oophorectomy   01/21/2016 Imaging   No MRI evidence of malignancy in the right breast or reconstructed left breast (TRAM flap).     HISTORY OF PRESENTING ILLNESS:  Natasha Dennis 54 y.o.  female with a prior history of breast cancer lobular cancer stage II; is  here for follow-up for her ongoing anemia/thrombocytopenia.  She is here to review results of her bone marrow biopsy.  Patient continues to have early satiety abdominal/epigastric discomfort.  Denies any nausea vomiting.  Poor appetite.  Denies any pain in the bones of the back.  No headaches.  No nausea vomiting.    Review of Systems  Constitutional: Positive for malaise/fatigue. Negative for chills, diaphoresis, fever and weight loss.  HENT: Negative for nosebleeds and sore throat.   Eyes: Negative for double vision.  Respiratory: Negative for cough, hemoptysis, sputum production, shortness of breath and wheezing.   Cardiovascular: Negative for chest pain, palpitations, orthopnea and leg swelling.  Gastrointestinal: Positive for abdominal pain. Negative for blood in stool, constipation, diarrhea, heartburn, melena, nausea and vomiting.  Genitourinary: Negative for dysuria, frequency and urgency.  Musculoskeletal: Negative for back pain and joint pain.  Skin: Negative.  Negative for itching and rash.  Neurological: Negative for dizziness, tingling, focal weakness, weakness and headaches.  Endo/Heme/Allergies: Does not bruise/bleed easily.  Psychiatric/Behavioral: Negative for depression. The patient is not nervous/anxious and does not have insomnia.      MEDICAL HISTORY:  Past Medical History:  Diagnosis Date  . Breast cancer ([place]) 03-17-2009   lt Mastectomy  . Cancer ([place])   . GERD (gastroesophageal reflux disease)   . History of bone marrow biopsy 06/03/2019    SURGICAL HISTORY: Past Surgical History:  Procedure Laterality Date  . ABDOMINAL HYSTERECTOMY    . AUGMENTATION MAMMAPLASTY Right    RT BREAST IMPLANT ONLY  . MASTECTOMY Left 2011    SOCIAL HISTORY: Social History   Socioeconomic History  . Marital status: Married    Spouse name: Not on file  . Number of children: Not on file  . Years of education: Not  on file  . Highest education level: Not on file   Occupational History  . Not on file  Tobacco Use  . Smoking status: Former Smoker    Quit date: 03/05/1984    Years since quitting: 35.2  . Smokeless tobacco: Never Used  Substance and Sexual Activity  . Alcohol use: No  . Drug use: No  . Sexual activity: Not on file  Other Topics Concern  . Not on file  Social History Narrative  . Not on file   Social Determinants of Health   Financial Resource Strain:   . Difficulty of Paying Living Expenses:   Food Insecurity:   . Worried About Charity fundraiser in the Last Year:   . Arboriculturist in the Last Year:   Transportation Needs:   . Film/video editor (Medical):   Marland Kitchen Lack of Transportation (Non-Medical):   Physical Activity:   . Days of Exercise per Week:   . Minutes of Exercise per Session:   Stress:   . Feeling of Stress :   Social Connections:   . Frequency of Communication with Friends and Family:   . Frequency of Social Gatherings with Friends and Family:   . Attends Religious Services:   . Active Member of Clubs or Organizations:   . Attends Archivist Meetings:   Marland Kitchen Marital Status:   Intimate Partner Violence:   . Fear of Current or Ex-Partner:   . Emotionally Abused:   Marland Kitchen Physically Abused:   . Sexually Abused:     FAMILY HISTORY: Family History  Problem Relation Age of Onset  . Diabetes Mother   . Arthritis Mother   . Heart disease Father   . Breast cancer Neg Hx     ALLERGIES:  is allergic to influenza vaccines.  MEDICATIONS:  Current Outpatient Medications  Medication Sig Dispense Refill  . ALPRAZolam (XANAX) 0.5 MG tablet Take 0.5-1 tablets (0.25-0.5 mg total) by mouth at bedtime as needed for anxiety. 10 tablet 0  . cholecalciferol (VITAMIN D) 1000 units tablet Take 1,000 Units by mouth daily.    Marland Kitchen dexlansoprazole (DEXILANT) 60 MG capsule Take 60 mg by mouth daily.    Marland Kitchen letrozole (FEMARA) 2.5 MG tablet Take 1 tablet (2.5 mg total) by mouth daily. Once a day. 30 tablet 0  .  PARoxetine (PAXIL) 20 MG tablet Take 1 tablet (20 mg total) by mouth daily. 90 tablet 1   No current facility-administered medications for this visit.     Marland Kitchen  PHYSICAL EXAMINATION:   Vitals:   06/09/19 1020  BP: 126/78  Pulse: 91  Temp: 98.5 F (36.9 C)   Filed Weights   06/09/19 1020  Weight: 187 lb (84.8 kg)    Physical Exam  Constitutional: She is oriented to person, place, and time and well-developed, well-nourished, and in no distress.  HENT:  Head: Normocephalic and atraumatic.  Mouth/Throat: Oropharynx is clear and moist. No oropharyngeal exudate.  Eyes: Pupils are equal, round, and reactive to light.  Cardiovascular: Normal rate and regular rhythm.  Pulmonary/Chest: Effort normal and breath sounds normal. No respiratory distress. She has no wheezes.  Abdominal: Soft. Bowel sounds are normal. She exhibits no distension and no mass. There is no abdominal tenderness. There is no rebound and no guarding.  Abdominal discomfort on deep palpation  Musculoskeletal:        General: No tenderness or edema. Normal range of motion.     Cervical back: Normal range of motion and  neck supple.  Neurological: She is alert and oriented to person, place, and time.  Skin: Skin is warm.  Right and left BREAST exam (in the presence of husband)-right breast no masses felt. ;  Left breast mastectomy/reconstruction noted.  No unusual skin changes or dominant masses felt. Surgical scars noted.    Psychiatric: Affect normal.     LABORATORY DATA:  I have reviewed the data as listed Lab Results  Component Value Date   WBC 14.6 (H) 06/09/2019   HGB 8.1 (L) 06/09/2019   HCT 25.8 (L) 06/09/2019   MCV 93.5 06/09/2019   PLT 83 (L) 06/09/2019   Recent Labs    10/21/18 1613 05/27/19 1435 06/09/19 1114  NA 141 138 138  K 4.6 3.9 3.7  CL 104 107 108  CO2 _0 GLUCOSE 93 95 95  BUN _1 CREATININE 0.86 0.81 0.73  CALCIUM 9.2 8.8* 8.5*  GFRNONAA 77 >60 >60  GFRAA 89 >60 >60   PROT 6.4 6.5 6.6  ALBUMIN 4.2 3.9 3.8  AST 20 46* 42*  ALT 13 38 25  ALKPHOS 124* 309* 334*  BILITOT 0.4 1.1 1.7*     CT ABDOMEN PELVIS W CONTRAST  Result Date: 05/29/2019 CLINICAL DATA:  Mid abdominal pain for several months, palpable abnormality, loose stool, personal history of left breast cancer status post mastectomy EXAM: CT ABDOMEN AND PELVIS WITH CONTRAST TECHNIQUE: Multidetector CT imaging of the abdomen and pelvis was performed using the standard protocol following bolus administration of intravenous contrast. CONTRAST:  147m OMNIPAQUE IOHEXOL 300 MG/ML  SOLN COMPARISON:  08/19/2010 FINDINGS: Lower chest: No acute pleural or parenchymal lung disease. Hepatobiliary: Gallbladder is decompressed, limiting evaluation. Enhancement of the gallbladder mucosa is noted. No evidence of cholelithiasis. No pericholecystic fat stranding. Liver is unremarkable. Pancreas: There is abnormal soft tissue density in fat stranding at the root of mesentery extending to the uncinate process of the pancreas. There is adjacent wall thickening within the duodenal bulb. No discrete mass can be identified. Spleen: Spleen is enlarged measuring 15.2 cm in craniocaudal length. No focal parenchymal abnormality. Adrenals/Urinary Tract: The adrenals appear normal. Kidneys enhance normally and symmetrically. No urinary tract calculi. The bladder is unremarkable. Stomach/Bowel: As stated above, there is circumferential wall thickening of the duodenal bulb, best seen on axial images thirty-three through 42 and on coronal image 55 through 61. There is loss of normal fat plane between the proximal duodenum and pancreatic head. I do not see any bowel obstruction or ileus. Vascular/Lymphatic: Numerous small lymph nodes are seen at the root of the mesentery, at the area of mesenteric stranding, ill-defined uncinate process of the pancreas, and circumferential duodenal wall thickening. Lymph nodes are all less than 1 cm in size.  Vascular structures are grossly unremarkable. No significant aortic atherosclerosis. The splenic vein, portal vein, and superior mesenteric vein enhance normally. Reproductive: Status post hysterectomy. No adnexal masses. Other: No free fluid or free gas.  No abdominal wall hernia. Musculoskeletal: Diffuse sclerotic lesions are seen throughout the axial and appendicular skeleton, consistent with bony metastatic disease. No pathologic fracture. Reconstructed images demonstrate no additional findings. IMPRESSION: 1. Interval development of innumerable sclerotic lesions throughout the axial and appendicular skeleton, consistent with bony metastatic disease. Bone scan may be useful for further evaluation. 2. Abnormal soft tissue density at the root of the mesentery, with loss of normal fat planes between the uncinate process of the pancreatic head/uncinate process and duodenal bulb. Circumferential wall thickening of the proximal  duodenum as above. Overall, I would favor neoplasm rather than underlying inflammatory change such as pancreatitis. MRI with without contrast may be useful for further evaluation in this area. 3. Splenomegaly. 4. Numerous subcentimeter lymph nodes at the root of the mesentery, nonspecific. Given the bony findings and adjacent desmoplastic appearance of the mesentery, neoplasm cannot be excluded. PET-CT may be useful for further evaluation. These results will be called to the ordering clinician or representative by the Radiologist Assistant, and communication documented in the PACS or [employer]. Electronically Signed   By: Randa Ngo M.D.   On: 05/29/2019 13:50   NM PET Image Initial (PI) Skull Base To Thigh  Result Date: 06/09/2019 CLINICAL DATA:  Initial treatment strategy for bone lesions. History of lobular breast carcinoma. Splenomegaly EXAM: NUCLEAR MEDICINE PET SKULL BASE TO THIGH TECHNIQUE: 9.8 mCi F-18 FDG was injected intravenously. Full-ring PET imaging was performed  from the skull base to thigh after the radiotracer. CT data was obtained and used for attenuation correction and anatomic localization. Fasting blood glucose: 96 mg/dl COMPARISON:  6 CT 05/29/2019 FINDINGS: Mediastinal blood pool activity: SUV max 2.0 Liver activity: SUV max 3.2 NECK: No hypermetabolic lymph nodes in the neck. Incidental CT findings: none CHEST: Several adjacent enlarged rounded hypermetabolic LEFT axillary lymph nodes are present. Example 16 mm node (image 625/3) with SUV max equal 3.6. Similar adjacent LEFT axillary node SUV max equal 4.1. No hypermetabolic supraclavicular nodes. No hypermetabolic mediastinal lymph nodes. No enlarged mediastinal lymph nodes or hilar nodes. Post LEFT mastectomy anatomy. Prosthetic in the RIGHT breast. No abnormal metabolic activity within the breast tissue/chest wall. Incidental CT findings: No suspicious pulmonary nodules. Small subpleural LEFT lower lobe pulmonary nodule measures 3 mm (image 103/3). ABDOMEN/PELVIS: No abnormal metabolic activity liver. No hypermetabolic upper abdominal lymph nodes. No hypermetabolic pelvic lymph nodes. The spleen is enlarged but has normal metabolic activity. Spleen measures 13.3 cm in craniocaudad dimension. Incidental CT findings: none SKELETON: There is diffuse sclerotic lesions throughout the pelvis, spine ribs and proximal appendicular skeleton. On the background this diffuse sclerotic metastatic pattern there several foci of discrete hypermetabolic activity. For example within the T9 vertebral body lesion with SUV max equal 4.8. Similar findings in the T4-T5 vertebral bodies. Within the pelvis there scattered foci of mild hypermetabolic activity. For example the posterior RIGHT iliac bone with SUV max equal 4.3. Incidental CT findings: Diffuse skeletal sclerotic metastasis as above. IMPRESSION: 1. Mild to moderate hypermetabolic LEFT axial lymph nodes are favored local breast carcinoma recurrence. 2. Diffuse small  osteoblastic skeletal metastasis in a pattern typical of breast cancer skeletal metastasis. Scattered foci of hypermetabolic activity on the background of sclerotic metastasis consistent with active metastatic disease. Moderate metabolic activity is typical of lobular carcinoma. 3. Enlarged spleen without hypermetabolic activity. Electronically Signed   By: Suzy Bouchard M.D.   On: 06/09/2019 11:49   CT BONE MARROW BIOPSY  Result Date: 06/03/2019 INDICATION: 54 year old with thrombocytopenia, history of breast cancer and diffuse sclerotic bone metastasis. EXAM: CT GUIDED BONE MARROW BIOPSY Physician: Stephan Minister. Anselm Pancoast, MD MEDICATIONS: None. ANESTHESIA/SEDATION: Fentanyl 50 mcg IV; Versed 1 mg IV Moderate Sedation Time:  19 minutes The patient was continuously monitored during the procedure by the interventional radiology nurse under my direct supervision. COMPLICATIONS: None immediate. PROCEDURE: The procedure was explained to the patient. The risks and benefits of the procedure were discussed and the patient's questions were addressed. Informed consent was obtained from the patient. The patient was placed prone on CT table.  Images of the pelvis were obtained. The right side of back was prepped and draped in sterile fashion. The skin and right posterior ilium were anesthetized with 1% lidocaine. 11 gauge bone needle was directed into the right ilium with CT guidance. No blood could be obtained for an aspirate. As a result, 2 core biopsies were obtained. Bandage placed over the puncture site. FINDINGS: Extensive sclerotic lesions throughout the pelvis compatible with metastatic disease. IMPRESSION: CT guided bone marrow biopsy. Electronically Signed   By: Markus Daft M.D.   On: 06/03/2019 13:45    ASSESSMENT & PLAN:   Primary cancer of left breast with metastasis to other site [facility]) #Metastatic carcinoma on bone marrow biopsy; suggestive of recurrent breast cancer/lobular cancer-causing anemia  thrombocytopenia.  Awaiting PET scan  # Abdominal discomfort/epigastric/early satiety- CT scan of the abdomen pelvis-vague infiltrative lesion noted duodenum/pancreatic head.  Again highly suspicious of malignancy.  Awaiting PET scan.  Discussed that given the lack of enough tissue on bone marrow biopsy-we will likely need repeat biopsy.  Will review at tumor conference.  #Discussed the high clinical suspicion for metastatic breast cancer-I would recommend starting the patient on Femara; new prescription sent.  Recommend taking calcium plus vitamin D and Osteo Bi-Flex.  Also discussed if ER/PR HER-2/neu status is confirmed-recommend Faslodex plus CDK inhibitor.  However patient's thrombocytopenia could be limiting.   # Bone metastases- recommend NSAIDs.  Discuss bisphosphonate therapy.  # DISPOSITION: # labs today- cbc/cmp/ca-15-3; ca 27-29; cea; UA and culture # follow up in 1 week-MD; No labs- Dr.B  Addendum: PET scan results available after patient left-left axial adenopathy; multiple sclerotic bone lesions; no uptake noted in the abdomen pelvis.  Will discuss with patient/family.    Cammie Sickle, MD 06/09/2019 2:27 PM

## 2019-06-10 ENCOUNTER — Telehealth: Payer: Self-pay | Admitting: Internal Medicine

## 2019-06-10 ENCOUNTER — Ambulatory Visit
Admission: RE | Admit: 2019-06-10 | Discharge: 2019-06-10 | Disposition: A | Discharge status: Home / Self Care | Payer: No Typology Code available for payment source | Source: Ambulatory Visit | Attending: Internal Medicine | Admitting: Internal Medicine

## 2019-06-10 ENCOUNTER — Other Ambulatory Visit: Payer: Self-pay | Admitting: Internal Medicine

## 2019-06-10 ENCOUNTER — Other Ambulatory Visit: Payer: Self-pay

## 2019-06-10 ENCOUNTER — Encounter: Payer: Self-pay | Admitting: Internal Medicine

## 2019-06-10 ENCOUNTER — Encounter (HOSPITAL_COMMUNITY): Payer: Self-pay | Admitting: Internal Medicine

## 2019-06-10 DIAGNOSIS — C50912 Malignant neoplasm of unspecified site of left female breast: Secondary | ICD-10-CM | POA: Insufficient documentation

## 2019-06-10 DIAGNOSIS — R0600 Dyspnea, unspecified: Secondary | ICD-10-CM | POA: Insufficient documentation

## 2019-06-10 DIAGNOSIS — R0781 Pleurodynia: Secondary | ICD-10-CM | POA: Insufficient documentation

## 2019-06-10 DIAGNOSIS — R0609 Other forms of dyspnea: Secondary | ICD-10-CM

## 2019-06-10 LAB — CANCER ANTIGEN 27.29: CA 27.29: 125.4 U/mL — ABNORMAL HIGH (ref 0.0–38.6)

## 2019-06-10 LAB — CEA: CEA: 169 ng/mL — ABNORMAL HIGH (ref 0.0–4.7)

## 2019-06-10 LAB — CANCER ANTIGEN 15-3: CA 15-3: 50.2 U/mL — ABNORMAL HIGH (ref 0.0–25.0)

## 2019-06-10 MED ORDER — LEVOFLOXACIN 500 MG PO TABS: 500.0000 mg | ORAL_TABLET | Freq: Every day | ORAL | 0 refills | Status: DC

## 2019-06-10 MED ORDER — IOHEXOL 350 MG/ML SOLN
100.0000 mL | Freq: Once | INTRAVENOUS | Status: AC | PRN
  Administered 2019-06-10: 100 mL via INTRAVENOUS

## 2019-06-10 NOTE — Telephone Encounter (Signed)
CT chest- PE - scheduled at 1pm today.

## 2019-06-10 NOTE — Progress Notes (Signed)
Contacted patient per Dr. Rogue Bussing. Pt provided with the results per Dr. Jacinto Reap. "please call pt and tell her that she might have pneumonia/ pleurisy causing the pain; but no blood clot'; and tell her that i will call in antibiotic to her her pharmacy now- i will call her little later"

## 2019-06-10 NOTE — Progress Notes (Signed)
Levofloxacin called in to the pharmacy.  Please inform patient.  We will reach out to the patient late in the afternoon.

## 2019-06-10 NOTE — Telephone Encounter (Signed)
On 4/06-I reviewed with the patient the results of the PET scan; left axillary adenopathy/multiple bone lesions suggestive of breast cancer recurrence.  However none to explain patient's left lower quadrant abdominal pain.  Likely need ultrasound-guided lymph node biopsy.  Await tumor conference discussion on 4/08.  Patient currently started on Femara.  On 04/07-this morning patient called regarding worsening left chest pain pleuritic in nature; difficulty in breathing.  Unable to sleep.  Recommend stat CT chest.  Discussed that if symptoms get worse patient need to go to the emergency room for stat evaluation.  Patient agrees.

## 2019-06-11 ENCOUNTER — Telehealth: Payer: Self-pay | Admitting: Internal Medicine

## 2019-06-11 ENCOUNTER — Other Ambulatory Visit: Payer: No Typology Code available for payment source

## 2019-06-11 ENCOUNTER — Other Ambulatory Visit: Payer: Self-pay | Admitting: Internal Medicine

## 2019-06-11 LAB — URINE CULTURE: Culture: >=100000 — AB

## 2019-06-11 MED ORDER — SULFAMETHOXAZOLE-TRIMETHOPRIM 800-160 MG PO TABS: 1.0000 | ORAL_TABLET | Freq: Two times a day (BID) | ORAL | 0 refills | Status: DC

## 2019-06-11 NOTE — Progress Notes (Signed)
Tumor Board Documentation  Natasha Dennis was presented by Dr Rogue Bussing at our Tumor Board on 06/11/2019, which included representatives from medical oncology, radiation oncology, navigation, pathology, radiology, surgical, surgical oncology, internal medicine, genetics, research.  Natasha Dennis currently presents as a current patient, for [place], for new positive pathology with history of the following treatments: active survellience, adjuvant chemotherapy, surgical intervention(s).  Additionally, we reviewed previous medical and familial history, history of present illness, and recent lab results along with all available histopathologic and imaging studies. The tumor board considered available treatment options and made the following recommendations:   AI therapy, Refer to Genetics  The following procedures/referrals were also placed: No orders of the defined types were placed in this encounter.   Clinical Trial Status: not discussed   Staging used:    AJCC Staging:       Group: Breast Cancer with metastesis to bone   National site-specific guidelines   were discussed with respect to the case.  Tumor board is a meeting of clinicians from various specialty areas who evaluate and discuss patients for whom a multidisciplinary approach is being considered. Final determinations in the plan of care are those of the provider(s). The responsibility for follow up of recommendations given during tumor board is that of the provider.   Today's extended care, comprehensive team conference, Natasha Dennis was not present for the discussion and was not examined.   Multidisciplinary Tumor Board is a multidisciplinary case peer review process.  Decisions discussed in the Multidisciplinary Tumor Board reflect the opinions of the specialists present at the conference without having examined the patient.  Ultimately, treatment and diagnostic decisions rest with the primary provider(s) and the patient.

## 2019-06-11 NOTE — Telephone Encounter (Signed)
On 04/07-spoke to patient regarding results of the CT chest negative for PE; atelectasis versus pneumonia.  UA positive for E. Coli.  Started patient on Levaquin.  Discussed regarding elevated tumor markers; await tumor conference discussion regarding biopsy planning/left axial lymph node.  Keep appointment as planned

## 2019-06-11 NOTE — Progress Notes (Signed)
Urine culture-positive for E. Coli; resistant to ciprofloxacin.  Discontinue levofloxacin.  Start patient on Bactrim; new prescription sent.

## 2019-06-12 ENCOUNTER — Ambulatory Visit (INDEPENDENT_AMBULATORY_CARE_PROVIDER_SITE_OTHER): Payer: No Typology Code available for payment source | Admitting: Family Medicine

## 2019-06-12 ENCOUNTER — Encounter: Payer: Self-pay | Admitting: Family Medicine

## 2019-06-12 ENCOUNTER — Telehealth: Payer: Self-pay | Admitting: Internal Medicine

## 2019-06-12 ENCOUNTER — Other Ambulatory Visit: Payer: Self-pay | Admitting: *Deleted

## 2019-06-12 ENCOUNTER — Telehealth: Payer: Self-pay | Admitting: *Deleted

## 2019-06-12 ENCOUNTER — Other Ambulatory Visit: Payer: Self-pay | Admitting: Internal Medicine

## 2019-06-12 ENCOUNTER — Other Ambulatory Visit: Payer: Self-pay

## 2019-06-12 DIAGNOSIS — F4322 Adjustment disorder with anxiety: Secondary | ICD-10-CM

## 2019-06-12 DIAGNOSIS — C50912 Malignant neoplasm of unspecified site of left female breast: Secondary | ICD-10-CM

## 2019-06-12 DIAGNOSIS — F419 Anxiety disorder, unspecified: Secondary | ICD-10-CM | POA: Diagnosis not present

## 2019-06-12 MED ORDER — ALPRAZOLAM 1 MG PO TABS: 1.0000 mg | ORAL_TABLET | Freq: Every evening | ORAL | 0 refills | Status: DC | PRN

## 2019-06-12 NOTE — Assessment & Plan Note (Signed)
Has had better control of anxiety and depression with paroxetine 20mg  daily and PRN usage of alprazolam.  Reports having new diagnosis of cancer with her oncologist and having an upcoming biopsy scheduled to determine their course of treatment.  Has intermittent increased anxiety on some days and requesting to increase her alprazolam to 1mg  as she cuts them in half to 0.5 or 0.25 dosages so they last her a bit longer.  Patient is responsible with her medications and PDMP has been reviewed without any concerns.  Plan: 1. Continue paroxetine 20mg  daily 2. Can take alprazolam 1mg  po BID PRN as needed for anxiety, and discussed this is a PRN medication for the immediate increased anxiety situation and not meant to be a long term medication, we can review when we will plan to taper in the future 3. Follow up in 3 months, sooner should the need arise.

## 2019-06-12 NOTE — Telephone Encounter (Signed)
Spoke with patient. Iv zometa added to her schedule for Tuesday's apt (06/16/19). Patient educated on purpose of infusion given her metastatic bone involvement. She gave verbal understanding of the plan of care. Information on her biopsy date given to patient. bx planned for Wed. 4/14 with arrival time of 12:30 pm. Pt instructed to remain NPO 6-8 hours prior to the procedure. She gave verbal understanding of the plan of care.

## 2019-06-12 NOTE — Telephone Encounter (Signed)
On 4/08-spoke to patient regarding results of urine culture positive for E. coli resistant to ciprofloxacin.  Recommend stopping Levaquin.  Start Bactrim.  Reviewed the results of the CT scan chest.  Reviewed the discussion at the tumor conference-recommend left axial lymph node biopsy.  Ordered.  Keep appointment as planned.  Please schedule.

## 2019-06-12 NOTE — Progress Notes (Signed)
Virtual Visit via Telephone  The purpose of this virtual visit is to provide medical care while limiting exposure to the novel coronavirus (COVID19) for both patient and office staff.  Consent was obtained for phone visit:  Yes.   Answered questions that patient had about telehealth interaction:  Yes.   I discussed the limitations, risks, security and privacy concerns of performing an evaluation and management service by telephone. I also discussed with the patient that there may be a patient responsible charge related to this service. The patient expressed understanding and agreed to proceed.  Patient is at home and is accessed via telephone Services are provided by Harlin Rain, FNP-C from [facility])  ---------------------------------------------------------------------- Chief Complaint  Patient presents with  . Anxiety    S: Reviewed CMA documentation. I have called patient and gathered additional HPI as follows:  Ms. Quilter presents for telemedicine visit in clinic today.  Reports that her paxil has helped with her anxiety but still has intermittent anxiety that is requiring the usage of alprazolam.  Reports was recently diagnosed with cancer and has a biopsy upcoming that will show what type of cancer she is facing and what treatment plan they will be pursing.    Patient is currently home Denies any high risk travel to areas of current concern for COVID19. Denies any known or suspected exposure to person with or possibly with COVID19.  Denies any fevers, chills, sweats, body ache, cough, shortness of breath, sinus pain or pressure, headache, abdominal pain, diarrhea  Past Medical History:  Diagnosis Date  . Breast cancer ([place]) 03-17-2009   lt Mastectomy and chemo  . Cancer ([place])   . GERD (gastroesophageal reflux disease)   . History of bone marrow biopsy 06/03/2019   Social History   Tobacco Use  . Smoking status: Former Smoker    Quit  date: 03/05/1984    Years since quitting: 35.2  . Smokeless tobacco: Never Used  Substance Use Topics  . Alcohol use: No  . Drug use: No    Current Outpatient Medications:  .  cholecalciferol (VITAMIN D) 1000 units tablet, Take 1,000 Units by mouth daily., Disp: , Rfl:  .  dexlansoprazole (DEXILANT) 60 MG capsule, Take 60 mg by mouth daily., Disp: , Rfl:  .  letrozole (FEMARA) 2.5 MG tablet, Take 1 tablet (2.5 mg total) by mouth daily. Once a day., Disp: 30 tablet, Rfl: 0 .  PARoxetine (PAXIL) 20 MG tablet, Take 1 tablet (20 mg total) by mouth daily., Disp: 90 tablet, Rfl: 1 .  sulfamethoxazole-trimethoprim (BACTRIM DS) 800-160 MG tablet, Take 1 tablet by mouth 2 (two) times daily., Disp: 14 tablet, Rfl: 0 .  ALPRAZolam (XANAX) 1 MG tablet, Take 1 tablet (1 mg total) by mouth at bedtime as needed for anxiety., Disp: 30 tablet, Rfl: 0  Depression screen [facility] 2/9 06/12/2019 05/11/2019 10/17/2018  Decreased Interest 0 0 0  Down, Depressed, Hopeless 0 0 0  PHQ - 2 Score 0 0 0  Altered sleeping 0 1 0  Tired, decreased energy 0 0 0  Change in appetite 0 0 0  Feeling bad or failure about yourself  0 0 0  Trouble concentrating 0 0 0  Moving slowly or fidgety/restless 0 0 0  Suicidal thoughts 0 0 0  PHQ-9 Score 0 1 0  Difficult doing work/chores Not difficult at all Not difficult at all Not difficult at all    GAD 7 : Generalized Anxiety Score 06/12/2019 05/11/2019 10/17/2018 04/18/2018  Nervous, Anxious, on Edge 1 1 0 0  Control/stop worrying 0 0 0 0  Worry too much - different things 0 0 0 0  Trouble relaxing 0 1 0 0  Restless 0 0 0 0  Easily annoyed or irritable 0 0 0 0  Afraid - awful might happen 0 0 0 0  Total GAD 7 Score 1 2 0 0  Anxiety Difficulty Not difficult at all Not difficult at all Not difficult at all Not difficult at all    -------------------------------------------------------------------------- O: No physical exam performed due to remote telephone encounter.  Physical  Exam: Patient remotely monitored without video.  Verbal communication appropriate.  Cognition normal.   Recent Results (from the past 2160 hour(s))  CBC with Differential     Status: Abnormal   Collection Time: 05/26/19 11:41 AM  Result Value Ref Range   WBC 12.0 (H) 4.0 - 10.5 K/uL   RBC 3.02 (L) 3.87 - 5.11 MIL/uL   Hemoglobin 8.6 (L) 12.0 - 15.0 g/dL   HCT 27.0 (L) 36.0 - 46.0 %   MCV 89.4 80.0 - 100.0 fL   MCH 28.5 26.0 - 34.0 pg   MCHC 31.9 30.0 - 36.0 g/dL   RDW 23.9 (H) 11.5 - 15.5 %   Platelets 64 (L) 150 - 400 K/uL   nRBC 1.1 (H) 0.0 - 0.2 %   Neutrophils Relative % 53 %   Neutro Abs 6.4 1.7 - 7.7 K/uL   Lymphocytes Relative 39 %   Lymphs Abs 4.6 (H) 0.7 - 4.0 K/uL   Monocytes Relative 4 %   Monocytes Absolute 0.5 0.1 - 1.0 K/uL   Eosinophils Relative 1 %   Eosinophils Absolute 0.2 0.0 - 0.5 K/uL   Basophils Relative 1 %   Basophils Absolute 0.1 0.0 - 0.1 K/uL   Immature Granulocytes 2 %   Abs Immature Granulocytes 0.18 (H) 0.00 - 0.07 K/uL    Comment: Performed at [facility], [address] [place], [place] [address]  Comprehensive metabolic panel     Status: Abnormal   Collection Time: 05/27/19  2:35 PM  Result Value Ref Range   Sodium 138 135 - 145 mmol/L   Potassium 3.9 3.5 - 5.1 mmol/L   Chloride 107 98 - 111 mmol/L   CO2 24 22 - 32 mmol/L   Glucose, Bld 95 70 - 99 mg/dL    Comment: Glucose reference range applies only to samples taken after fasting for at least 8 hours.   BUN 14 6 - 20 mg/dL   Creatinine, Ser 0.81 0.44 - 1.00 mg/dL   Calcium 8.8 (L) 8.9 - 10.3 mg/dL   Total Protein 6.5 6.5 - 8.1 g/dL   Albumin 3.9 3.5 - 5.0 g/dL   AST 46 (H) 15 - 41 U/L   ALT 38 0 - 44 U/L   Alkaline Phosphatase 309 (H) 38 - 126 U/L   Total Bilirubin 1.1 0.3 - 1.2 mg/dL   GFR calc non Af Amer >60 >60 mL/min   GFR calc Af Amer >60 >60 mL/min   Anion gap 7 5 - 15    Comment: Performed at [facility], [address], [place],  [address]  Flow  cytometry panel-leukemia/lymphoma work-up     Status: None   Collection Time: 05/27/19  2:35 PM  Result Value Ref Range   PATH INTERP XXX-IMP Comment     Comment: (NOTE) No significant immunophenotypic abnormality detected Lymphocytosis, see comment.    ANNOTATION COMMENT IMP  Comment     Comment: (NOTE) The lymphocytosis is due to an increase in CD4+ T cells, CD8+ T cells, NK cells and B cells. Increases in multiple lymphocyte subsets suggests a reactive process. Clinical correlation is recommended.    CLINICAL INFO Comment     Comment: (NOTE) Accompanying CBC dated 05-27-19 shows: WBC count 13.5, Hgb 8.5, Neu 7.3, Lym 5.5, Mon 0.4, Plt 72K    Specimen Type Comment     Comment: Peripheral blood   ASSESSMENT OF LEUKOCYTES Comment     Comment: (NOTE) No monoclonal B cell population is detected. kappa:lambda ratio 1.6 There is no loss of, or aberrant expression of, the pan T cell antigens to suggest a neoplastic T cell process. CD4:CD8 ratio 1.2 A small population (6%) of double positive (CD4+/CD8+) T cells is detected. Double positive T cells have been described in association with chronic viral infections, autoimmune disorders, chronic inflammatory disorders, and immunodeficiency states. No circulating blasts are detected. Rare granulocytes show left-shifted maturation. Analysis of the leukocyte population shows: granulocytes 58%, monocytes 3%, lymphocytes 39%, blasts <0.1%, B cells 11%, T cells 23%, NK cells 5%.    % Viable Cells Comment     Comment: 95%   ANALYSIS AND GATING STRATEGY Comment     Comment: 8 color analysis with CD45/SSC   IMMUNOPHENOTYPING STUDY Comment     Comment: (NOTE) CD2       Normal         CD3       Normal CD4       Normal         CD5       Normal CD7       Normal         CD8       Normal CD10      Normal         CD11b     Normal CD13      Normal         CD14      Normal CD16      Normal         CD19      Normal CD20      Normal          CD33      Normal CD34      Normal         CD38      Normal CD45      Normal         CD56      Normal CD57      Normal         CD117     Normal HLA-DR    Normal         KAPPA     Normal LAMBDA    Normal         CD64      Normal    PATHOLOGIST NAME Comment     Comment: Smiley Houseman, M.D.   COMMENT: Comment     Comment: (NOTE) Each antibody in this assay was utilized to assess for potential abnormalities of studied cell populations or to characterize identified abnormalities. This test was developed and its performance characteristics determined by [facility].  It has not been cleared or approved by the U.S. Food and Drug Administration. The FDA has determined that such clearance or approval is not necessary. This test is used for clinical purposes.  It should not be regarded as investigational or for research.  Performed At: -[facility] [address] [place], [place] [id number] Katina Degree MDPhD [id number] Performed At: [facility] [address] [place], [place] [id number] Katina Degree MDPhD [id number]   Reticulocytes     Status: Abnormal   Collection Time: 05/27/19  2:35 PM  Result Value Ref Range   Retic Ct Pct 10.6 (H) 0.4 - 3.1 %   RBC. 2.65 (L) 3.87 - 5.11 MIL/uL   Retic Count, Absolute 275.5 (H) 19.0 - 186.0 K/uL   Immature Retic Fract 34.7 (H) 2.3 - 15.9 %    Comment: Performed at [facility], [address] [place], [place] [address]  Lactate dehydrogenase     Status: Abnormal   Collection Time: 05/27/19  2:35 PM  Result Value Ref Range   LDH 249 (H) 98 - 192 U/L    Comment: Performed at [facility], [address] [place], [place] [address]  Ferritin     Status: Abnormal   Collection Time: 05/27/19  2:35 PM  Result Value Ref Range   Ferritin 309 (H) 11 - 307 ng/mL    Comment: Performed at [facility], [address] [place], [place] [address]  CBC with Differential/Platelet     Status: Abnormal   Collection Time: 05/27/19   2:35 PM  Result Value Ref Range   WBC 13.5 (H) 4.0 - 10.5 K/uL   RBC 2.92 (L) 3.87 - 5.11 MIL/uL   Hemoglobin 8.5 (L) 12.0 - 15.0 g/dL   HCT 25.9 (L) 36.0 - 46.0 %   MCV 88.7 80.0 - 100.0 fL   MCH 29.1 26.0 - 34.0 pg   MCHC 32.8 30.0 - 36.0 g/dL   RDW 23.9 (H) 11.5 - 15.5 %   Platelets 72 (L) 150 - 400 K/uL   nRBC 1.3 (H) 0.0 - 0.2 %   Neutrophils Relative % 54 %   Neutro Abs 7.3 1.7 - 7.7 K/uL   Lymphocytes Relative 41 %   Lymphs Abs 5.5 (H) 0.7 - 4.0 K/uL   Monocytes Relative 3 %   Monocytes Absolute 0.4 0.1 - 1.0 K/uL   Eosinophils Relative 1 %   Eosinophils Absolute 0.1 0.0 - 0.5 K/uL   Basophils Relative 0 %   Basophils Absolute 0.0 0.0 - 0.1 K/uL   WBC Morphology      DIFF CONFIRMED BY MANUAL. FEW LARGE GRANULAR LYMPHOCYTES AND OCC ABNORMAL LYMPHOCYTES NOTED.   RBC Morphology MIXED RBC POPULATION WITH FEW NRBCS PRESENT    Smear Review PLATELETS APPEAR DECREASED     Comment: PLATELETS VARY IN SIZE WITH FEW LARGE BODY PLATELETS NOTED   Myelocytes 1 %   Abs Immature Granulocytes 0.10 (H) 0.00 - 0.07 K/uL   Polychromasia PRESENT    Basophilic Stippling PRESENT    Ovalocytes PRESENT     Comment: Performed at [facility], [address] [place], [place] [address]  Iron and TIBC     Status: None   Collection Time: 05/27/19  2:35 PM  Result Value Ref Range   Iron 89 28 - 170 ug/dL   TIBC 323 250 - 450 ug/dL   Saturation Ratios 28 10.4 - 31.8 %   UIBC 234 ug/dL    Comment: Performed at [facility], [address] [place], [place] [address]  Technologist smear review     Status: None   Collection Time: 05/27/19  2:38 PM  Result Value Ref Range   Tech Review SEE CBC FROM 05/27/2019  Comment: Performed at [facility], [address] [place], [place] [address]  Surgical pathology     Status: None   Collection Time: 06/03/19 12:00 AM  Result Value Ref Range   SURGICAL PATHOLOGY      SURGICAL PATHOLOGY CASE: [id number] PATIENT: Shacarra  Grilli Bone Marrow Report     Clinical History: breast cancer in 2010 with thrombocytopenia and diffuse bone metastasis, dry aspirate, 2 core, (ADC)     DIAGNOSIS:  BONE MARROW, ASPIRATE, CLOT, CORE: -Involvement by metastatic carcinoma -See comment  PERIPHERAL BLOOD: -Normocytic-normochromic anemia -Leukoerythroblastic reaction Thrombocytopenia  COMMENT:  The bone marrow shows prominent involvement by metastatic carcinoma, presumably from previously known breast primary but the differential diagnosis includes metastasis from upper gastrointestinal tract, lung, etc.  There is a background of trilineage hematopoiesis.  Clinical correlation is recommended.  MICROSCOPIC DESCRIPTION:  PERIPHERAL BLOOD SMEAR: The red blood cells display prominent anisocytosis with normocytic and macrocytic cells.  There is mild poikilocytosis with elliptocytes, teardrops and scattered schistocytes. There is promi nent polychromasia associated with circulating nucleated red blood cells.  The white blood cells are slightly increased in number, mostly with neutrophils associated with occasional myelocytes seen on scan.  The platelets are decreased in number  BONE MARROW ASPIRATE: No aspirate material is available for review.  The following evaluation was performed on touch imprints Erythroid precursors: Progressive maturation with scattered maturing cells displaying nuclear cytoplasmic dyssynchrony Granulocytic precursors: Apparently orderly and progressive maturation Megakaryocytes: Abundant with predominantly normal morphology. Scattered large forms present. Lymphocytes/plasma cells: Large aggregates not present Other: In some areas, there are clusters of atypical epithelioid appearing cells.  TOUCH PREPARATIONS: See above  CLOT AND BIOPSY: No clot sections are available for review.  The core biopsy is prominently involved by metastatic carcinoma in the form of numerous varia bly  sized clusters and infiltrating individual malignant epithelioid appearing cells associated with desmoplasia.  Many of the cells have crush artifacts but in small viable areas they are characterized by pleomorphic nuclei and amphophilic to eosinophilic cytoplasm.  Some of the cells display signet cell appearance.  Areas of residual hematopoiesis are seen in the background.  A battery of immunohistochemical stains was performed and shows that the metastatic tumor cells are positive for cytokeratin 8/18, cytokeratin AE1/AE3, and cytokeratin 7.  There is focal weak blush with GATA3.  No significant staining is seen with CDX-2, cytokeratin 20, estrogen receptor, progesterone receptor, GCDFP, TTF-1 or WT-1.  IRON STAIN: Iron stains are performed on a bone marrow aspirate or touch imprint smear and section of clot. The controls stained appropriately.       Storage Iron: Appears decreased      Ring Sideroblasts: Absent  ADDITIONAL DATA/TESTING: The specimen was sent for  cytogenetic analysis and a separate report will follow   CELL COUNT DATA:  Bone Marrow count performed on 500 cells on touch imprints shows: Blasts:   0%   Myeloid:  41% Promyelocytes: 0%   Erythroid:     32% Myelocytes:    3%   Lymphocytes:   23% Metamyelocytes:     0%   Plasma cells:  1% Bands:    7% Neutrophils:   28%  M:E ratio:     1.3 Eosinophils:   3% Basophils:     0% Monocytes:     3%  Lab Data: CBC performed on 06/03/2019 shows: WBC: 12.0 k/uL Neutrophils:   52% Hgb: 8.4 g/dL  Lymphocytes:   42% HCT: 25.7 %    Monocytes:  4% MCV: 89.2 fL   Eosinophils:   1% RDW: 23.9 %    Basophils:     1% PLT: 77 k/uL    GROSS DESCRIPTION:  A.  Received in B-plus fixative is a 1.8 cm in length by 0.3 cm diameter tan-brown hard core, submitted on block A1 following decalcification in Immunocal.  (AK 06/03/2019)    Final Diagnosis performed by Susanne Greenhouse, MD.   Electronically signed 06/05/2019 Technical and /  or Professional components performed at [facility], [place] [address], [place], [place] [address].  Immunohistochemistry Technical component (if applicable) was performed at [facility]. [address] [place], [place], [place] [address].   IMMUNOHISTOCHEMISTRY DISCLAIMER (if applicable): Some of these immunohistochemical stains may have been developed and the performance characteristics determine by [facility]. Some may not have been cleared or approved by the U.S. Food and Drug Administration. The FDA has determined that such clearance or approval is not necessary. This test is used for clinical purposes. It should not be regarded as investigational or for research. This laboratory is certified under the [place] (CLIA-88) as qualified to perform high complexity clinical laboratory testing.  The controls stained appropriately.   CBC with Differential/Platelet     Status: Abnormal   Collection Time: 06/03/19  8:03 AM  Result Value Ref Range   WBC 12.0 (H) 4.0 - 10.5 K/uL   RBC 2.88 (L) 3.87 - 5.11 MIL/uL   Hemoglobin 8.4 (L) 12.0 - 15.0 g/dL   HCT 25.7 (L) 36.0 - 46.0 %   MCV 89.2 80.0 - 100.0 fL   MCH 29.2 26.0 - 34.0 pg   MCHC 32.7 30.0 - 36.0 g/dL   RDW 23.9 (H) 11.5 - 15.5 %   Platelets 77 (L) 150 - 400 K/uL    Comment: Immature Platelet Fraction may be clinically indicated, consider ordering this additional test [id number]    nRBC 1.3 (H) 0.0 - 0.2 %   Neutrophils Relative % 54 %   Neutro Abs 6.4 1.7 - 7.7 K/uL   Lymphocytes Relative 38 %   Lymphs Abs 4.5 (H) 0.7 - 4.0 K/uL   Monocytes Relative 5 %   Monocytes Absolute 0.6 0.1 - 1.0 K/uL   Eosinophils Relative 1 %   Eosinophils Absolute 0.2 0.0 - 0.5 K/uL   Basophils Relative 1 %   Basophils Absolute 0.1 0.0 - 0.1 K/uL   Immature Granulocytes 1 %   Abs Immature Granulocytes 0.16 (H) 0.00 - 0.07 K/uL    Comment: Performed at  [facility], [address] [place], [place] [address]  Glucose, capillary     Status: None   Collection Time: 06/08/19  8:23 AM  Result Value Ref Range   Glucose-Capillary 96 70 - 99 mg/dL    Comment: Glucose reference range applies only to samples taken after fasting for at least 8 hours.  Urine culture     Status: Abnormal   Collection Time: 06/09/19 11:14 AM   Specimen: Urine, Clean Catch  Result Value Ref Range   Specimen Description      URINE, CLEAN CATCH Performed at [facility], [address] [place], [place] [address]    Special Requests      NONE Performed at [facility], [address] [place], [place] [address]    Culture >=100,000 COLONIES/mL ESCHERICHIA COLI (A)    Report Status 06/11/2019 FINAL    Organism ID, Bacteria ESCHERICHIA COLI (  A)       Susceptibility   Escherichia coli - MIC*    AMPICILLIN <=2 SENSITIVE Sensitive     CEFAZOLIN <=4 SENSITIVE Sensitive     CEFTRIAXONE <=0.25 SENSITIVE Sensitive     CIPROFLOXACIN >=4 RESISTANT Resistant     GENTAMICIN <=1 SENSITIVE Sensitive     IMIPENEM <=0.25 SENSITIVE Sensitive     NITROFURANTOIN <=16 SENSITIVE Sensitive     TRIMETH/SULFA <=20 SENSITIVE Sensitive     AMPICILLIN/SULBACTAM <=2 SENSITIVE Sensitive     PIP/TAZO <=4 SENSITIVE Sensitive     * >=100,000 COLONIES/mL ESCHERICHIA COLI  Urinalysis, Complete w Microscopic     Status: Abnormal   Collection Time: 06/09/19 11:14 AM  Result Value Ref Range   Color, Urine YELLOW (A) YELLOW   APPearance HAZY (A) CLEAR   Specific Gravity, Urine 1.016 1.005 - 1.030   pH 5.0 5.0 - 8.0   Glucose, UA NEGATIVE NEGATIVE mg/dL   Hgb urine dipstick NEGATIVE NEGATIVE   Bilirubin Urine NEGATIVE NEGATIVE   Ketones, ur NEGATIVE NEGATIVE mg/dL   Protein, ur NEGATIVE NEGATIVE mg/dL   Nitrite POSITIVE (A) NEGATIVE   Leukocytes,Ua SMALL (A) NEGATIVE   RBC / HPF 0-5 0 - 5 RBC/hpf   WBC, UA 21-50 0 - 5 WBC/hpf   Bacteria, UA RARE (A) NONE SEEN    Squamous Epithelial / LPF 0-5 0 - 5   Mucus PRESENT     Comment: Performed at [facility], [address] [place],  [address]  CEA     Status: Abnormal   Collection Time: 06/09/19 11:14 AM  Result Value Ref Range   CEA 169.0 (H) 0.0 - 4.7 ng/mL    Comment: (NOTE)                             Nonsmokers          <3.9                             Smokers             <5.6 [employer] Electrochemiluminescence Immunoassay (ECLIA) Values obtained with different assay methods or kits cannot be used interchangeably.  Results cannot be interpreted as absolute evidence of the presence or absence of malignant disease. Performed At: [facility] [place], [place] [id number] Rush Farmer MD [id number]   Cancer antigen 27.29     Status: Abnormal   Collection Time: 06/09/19 11:14 AM  Result Value Ref Range   CA 27.29 125.4 (H) 0.0 - 38.6 U/mL    Comment: (NOTE) Siemens Centaur Immunochemiluminometric Methodology [facility]) Values obtained with different assay methods or kits cannot be used interchangeably. Results cannot be interpreted as absolute evidence of the presence or absence of malignant disease. Performed At: [facility] [place], [place] [id number] Rush Farmer MD GE:[id number]   Cancer antigen 15-3     Status: Abnormal   Collection Time: 06/09/19 11:14 AM  Result Value Ref Range   CA 15-3 50.2 (H) 0.0 - 25.0 U/mL    Comment: (NOTE) [employer] Electrochemiluminescence Immunoassay (ECLIA) Values obtained with different assay methods or kits cannot be used interchangeably.  Results cannot be interpreted as absolute evidence of the presence or absence of malignant disease. Performed At: [facility] [address] [place], [place] [id number] Rush Farmer MD [id number]   Comprehensive metabolic panel  Status: Abnormal   Collection Time: 06/09/19 11:14 AM  Result Value Ref Range    Sodium 138 135 - 145 mmol/L   Potassium 3.7 3.5 - 5.1 mmol/L   Chloride 108 98 - 111 mmol/L   CO2 24 22 - 32 mmol/L   Glucose, Bld 95 70 - 99 mg/dL    Comment: Glucose reference range applies only to samples taken after fasting for at least 8 hours.   BUN 9 6 - 20 mg/dL   Creatinine, Ser 0.73 0.44 - 1.00 mg/dL   Calcium 8.5 (L) 8.9 - 10.3 mg/dL   Total Protein 6.6 6.5 - 8.1 g/dL   Albumin 3.8 3.5 - 5.0 g/dL   AST 42 (H) 15 - 41 U/L   ALT 25 0 - 44 U/L   Alkaline Phosphatase 334 (H) 38 - 126 U/L   Total Bilirubin 1.7 (H) 0.3 - 1.2 mg/dL   GFR calc non Af Amer >60 >60 mL/min   GFR calc Af Amer >60 >60 mL/min   Anion gap 6 5 - 15    Comment: Performed at [facility], [address] [place], [place] [address]  CBC with Differential     Status: Abnormal   Collection Time: 06/09/19 11:14 AM  Result Value Ref Range   WBC 14.6 (H) 4.0 - 10.5 K/uL   RBC 2.76 (L) 3.87 - 5.11 MIL/uL   Hemoglobin 8.1 (L) 12.0 - 15.0 g/dL   HCT 25.8 (L) 36.0 - 46.0 %   MCV 93.5 80.0 - 100.0 fL   MCH 29.3 26.0 - 34.0 pg   MCHC 31.4 30.0 - 36.0 g/dL   RDW 24.1 (H) 11.5 - 15.5 %   Platelets 83 (L) 150 - 400 K/uL   nRBC 2.0 (H) 0.0 - 0.2 %   Neutrophils Relative % 51 %   Neutro Abs 7.6 1.7 - 7.7 K/uL   Lymphocytes Relative 40 %   Lymphs Abs 5.9 (H) 0.7 - 4.0 K/uL   Monocytes Relative 5 %   Monocytes Absolute 0.7 0.1 - 1.0 K/uL   Eosinophils Relative 1 %   Eosinophils Absolute 0.2 0.0 - 0.5 K/uL   Basophils Relative 1 %   Basophils Absolute 0.1 0.0 - 0.1 K/uL   Immature Granulocytes 2 %   Abs Immature Granulocytes 0.22 (H) 0.00 - 0.07 K/uL    Comment: Performed at [facility], [address] [place], [place] [address]    -------------------------------------------------------------------------- A&P:  Problem List Items Addressed This Visit      Other   Anxiety    Has had better control of anxiety and depression with paroxetine 69m daily and PRN usage of alprazolam.  Reports  having new diagnosis of cancer with her oncologist and having an upcoming biopsy scheduled to determine their course of treatment.  Has intermittent increased anxiety on some days and requesting to increase her alprazolam to 164mas she cuts them in half to 0.5 or 0.25 dosages so they last her a bit longer.  Patient is responsible with her medications and PDMP has been reviewed without any concerns.  Plan: 1. Continue paroxetine 2067maily 2. Can take alprazolam 1mg34m BID PRN as needed for anxiety, and discussed this is a PRN medication for the immediate increased anxiety situation and not meant to be a long term medication, we can review when we will plan to taper in the future 3. Follow up in 3 months, sooner should the need arise.      Relevant  Medications   ALPRAZolam (XANAX) 1 MG tablet    Other Visit Diagnoses    Acute adjustment disorder with anxiety       Relevant Medications   ALPRAZolam (XANAX) 1 MG tablet      Meds ordered this encounter  Medications  . ALPRAZolam (XANAX) 1 MG tablet    Sig: Take 1 tablet (1 mg total) by mouth at bedtime as needed for anxiety.    Dispense:  30 tablet    Refill:  0    Follow-up: - Return in 3 months for follow up on anxiety, or sooner, should the need arise  Patient verbalizes understanding with the above medical recommendations including the limitation of remote medical advice.  Specific follow-up and call-back criteria were given for patient to follow-up or seek medical care more urgently if needed.  - Time spent in direct consultation with patient on phone: 5 minutes  Harlin Rain, [facility] 06/12/2019, 9:09 AM

## 2019-06-12 NOTE — Patient Instructions (Signed)
Have sent in a refill on your alprazolam, as we discussed, to use as a PRN medication for right now.  Continue to take your Paxil 20mg  daily as directed.  Will plan to see you back in clinic in 3 months, or sooner, should the need arise.  Let me know if there is anything else that I can do for you and if you decide you want to proceed with a referral to therapy/counseling.  You will receive a survey after today's visit either digitally by e-mail or paper by [employer]. Your experiences and feedback matter to [place].  Please respond so we know how we are doing as we provide care for you.  Call us with any questions/concerns/needs.  It is my goal to be available to you for your health concerns.  Thanks for choosing me to be a partner in your healthcare needs!  Harlin Rain, FNP-C Family Nurse Practitioner [facility] Phone: [phone]

## 2019-06-16 ENCOUNTER — Inpatient Hospital Stay: Payer: No Typology Code available for payment source

## 2019-06-16 ENCOUNTER — Other Ambulatory Visit: Payer: Self-pay | Admitting: Radiology

## 2019-06-16 ENCOUNTER — Inpatient Hospital Stay (HOSPITAL_BASED_OUTPATIENT_CLINIC_OR_DEPARTMENT_OTHER): Payer: No Typology Code available for payment source | Admitting: Internal Medicine

## 2019-06-16 ENCOUNTER — Encounter: Payer: Self-pay | Admitting: Internal Medicine

## 2019-06-16 ENCOUNTER — Other Ambulatory Visit: Payer: Self-pay

## 2019-06-16 DIAGNOSIS — C50912 Malignant neoplasm of unspecified site of left female breast: Secondary | ICD-10-CM | POA: Diagnosis not present

## 2019-06-16 NOTE — Assessment & Plan Note (Addendum)
#  Metastatic carcinoma on bone marrow biopsy; suggestive of recurrent breast cancer/lobular cancer-causing anemia thrombocytopenia.  PET scan 2021 April-left axilla adenopathy; multiple bone lesions; no obvious mass noted in the left upper quadrant of abdomen.  Patient awaiting left axial lymph node biopsy tomorrow.  Discussed this would be stage IV disease given the spread to the bone.  #Patient is currently on Femara.  However if biopsy-proven ER/PR positive HER-2 negative breast cancer-I would recommend Faslodex plus CDK-inhibitor.  #Left chest wall pain-CTA negative for PE.  Question is pleurisy.  S/p Bactrim.  # UTI-E. coli resistant to ciprofloxacin.  S/p bacrim improved.   # Bone metastases- recommend NSAIDs.  Discussed bisphosphonates-discussed the potential side effects of hypocalcemia; osteonecrosis of jaw.  Awaiting insurance approval.  Awaiting dental appointment next week.  # DISPOSITION: # follow up in 1 week-MD;labs- cbc/bmp' hold tube; zometa; faslodex- Dr.B

## 2019-06-16 NOTE — Progress Notes (Signed)
Patient aware that we received her fmla papers. Patient prefers intermittent leave. Prefers start date to be on 05/27/2019.

## 2019-06-16 NOTE — Progress Notes (Signed)
[facility] CONSULT NOTE  Patient Care Team: Malfi, Lupita Raider, FNP as PCP - General (Family Medicine)  CHIEF COMPLAINTS/PURPOSE OF CONSULTATION: Thrombocytopenia/anemia  Oncology History Overview Note  #  May 2010-stage II lobular cancer left breast; [Dr.Choksi] on a protocol received neoadjuvant chemotherapy s/p mastectomy reconstruction/TRAM; right breast augmentation.  Initially tamoxifen until July 2012-then Femara 2.5 mg; stopped because of joint pains; July 2013-Aromasin again stopped because of joint pains [total of 4 years-endocrine therapy].  #Bilateral salpingo-oophorectomy.   #March/April 2021-thrombocytopenia/worsening anemia-bone marrow biopsy-"carcinoma".  ER/PR status HER-2/neu status-NA; March 2021 abdomen pelvis CT scan-vague infiltrative lesion noted duodenal area; June 08, 2019-PET scan left axilla adenopathy; multiple bone lesions.    Primary cancer of left breast with metastasis to other site [facility])  07/05/2008 Initial Diagnosis   History is obtained through chart review from Dr. Metro Kung notes. She was originally diagnosed with breast cancer, invasive lobular carcinoma in May 2010. She was placed on the protocol and received neoadjuvant chemotherapy. She had left mastectomy with reconstruction surgery and right breast augmentation with implant. Subsequently she was placed on tamoxifen until July of 2012, then Femara 2.5 mg.  Femara was stopped because of bony pains July of 2013. She was then switched to Aromasin. Patient has stopped taking Aromasin because of perceived side effect. In total, she had received anti-estrogen therapy for almost 4 years   10/17/2010 Surgery   She had bilateral salpingo-oophorectomy   01/21/2016 Imaging   No MRI evidence of malignancy in the right breast or reconstructed left breast (TRAM flap).     HISTORY OF PRESENTING ILLNESS:  Natasha Dennis 54 y.o.  female with a prior history of breast cancer lobular cancer stage II;  with anemia thrombocytopenia with involvement of carcinoma in the bone marrow is here for follow-up/review results of the PET scan.  Patient is awaiting left axial lymph node biopsy tomorrow.  Patient left chest wall discomfort improved.  She has discomfort now only on deep palpation.  Patient is status post Bactrim for E. coli resistant to ciprofloxacin.  Continues to complain of early satiety.  Otherwise denies any nausea vomiting.  Review of Systems  Constitutional: Positive for malaise/fatigue. Negative for chills, diaphoresis, fever and weight loss.  HENT: Negative for nosebleeds and sore throat.   Eyes: Negative for double vision.  Respiratory: Negative for cough, hemoptysis, sputum production, shortness of breath and wheezing.   Cardiovascular: Negative for chest pain, palpitations, orthopnea and leg swelling.  Gastrointestinal: Positive for abdominal pain. Negative for blood in stool, constipation, diarrhea, heartburn, melena, nausea and vomiting.  Genitourinary: Negative for dysuria, frequency and urgency.  Musculoskeletal: Negative for back pain and joint pain.  Skin: Negative.  Negative for itching and rash.  Neurological: Negative for dizziness, tingling, focal weakness, weakness and headaches.  Endo/Heme/Allergies: Does not bruise/bleed easily.  Psychiatric/Behavioral: Negative for depression. The patient is not nervous/anxious and does not have insomnia.      MEDICAL HISTORY:  Past Medical History:  Diagnosis Date  . Breast cancer ([place]) 03-17-2009   lt Mastectomy and chemo  . Cancer ([place])   . GERD (gastroesophageal reflux disease)   . History of bone marrow biopsy 06/03/2019    SURGICAL HISTORY: Past Surgical History:  Procedure Laterality Date  . ABDOMINAL HYSTERECTOMY    . AUGMENTATION MAMMAPLASTY Right    RT BREAST IMPLANT ONLY  . MASTECTOMY Left 2011    SOCIAL HISTORY: Social History   Socioeconomic History  . Marital status: Married    Spouse name:  Not  on file  . Number of children: Not on file  . Years of education: Not on file  . Highest education level: Not on file  Occupational History  . Not on file  Tobacco Use  . Smoking status: Former Smoker    Quit date: 03/05/1984    Years since quitting: 35.3  . Smokeless tobacco: Never Used  Substance and Sexual Activity  . Alcohol use: No  . Drug use: No  . Sexual activity: Not on file  Other Topics Concern  . Not on file  Social History Narrative  . Not on file   Social Determinants of Health   Financial Resource Strain:   . Difficulty of Paying Living Expenses:   Food Insecurity:   . Worried About Charity fundraiser in the Last Year:   . Arboriculturist in the Last Year:   Transportation Needs:   . Film/video editor (Medical):   Marland Kitchen Lack of Transportation (Non-Medical):   Physical Activity:   . Days of Exercise per Week:   . Minutes of Exercise per Session:   Stress:   . Feeling of Stress :   Social Connections:   . Frequency of Communication with Friends and Family:   . Frequency of Social Gatherings with Friends and Family:   . Attends Religious Services:   . Active Member of Clubs or Organizations:   . Attends Archivist Meetings:   Marland Kitchen Marital Status:   Intimate Partner Violence:   . Fear of Current or Ex-Partner:   . Emotionally Abused:   Marland Kitchen Physically Abused:   . Sexually Abused:     FAMILY HISTORY: Family History  Problem Relation Age of Onset  . Diabetes Mother   . Arthritis Mother   . Heart disease Father   . Breast cancer Neg Hx     ALLERGIES:  is allergic to influenza vaccines.  MEDICATIONS:  Current Outpatient Medications  Medication Sig Dispense Refill  . ALPRAZolam (XANAX) 1 MG tablet Take 1 tablet (1 mg total) by mouth at bedtime as needed for anxiety. 30 tablet 0  . cholecalciferol (VITAMIN D) 1000 units tablet Take 1,000 Units by mouth daily.    Marland Kitchen dexlansoprazole (DEXILANT) 60 MG capsule Take 60 mg by mouth daily.    Marland Kitchen  letrozole (FEMARA) 2.5 MG tablet Take 1 tablet (2.5 mg total) by mouth daily. Once a day. 30 tablet 0  . PARoxetine (PAXIL) 20 MG tablet Take 1 tablet (20 mg total) by mouth daily. 90 tablet 1  . sulfamethoxazole-trimethoprim (BACTRIM DS) 800-160 MG tablet Take 1 tablet by mouth 2 (two) times daily. 14 tablet 0   No current facility-administered medications for this visit.     Marland Kitchen  PHYSICAL EXAMINATION:   Vitals:   06/16/19 1000  BP: 139/81  Pulse: 98  Resp: 20  Temp: 97.8 F (36.6 C)   Filed Weights   06/16/19 1008  Weight: 185 lb (83.9 kg)    Physical Exam  Constitutional: She is oriented to person, place, and time and well-developed, well-nourished, and in no distress.  HENT:  Head: Normocephalic and atraumatic.  Mouth/Throat: Oropharynx is clear and moist. No oropharyngeal exudate.  Eyes: Pupils are equal, round, and reactive to light.  Cardiovascular: Normal rate and regular rhythm.  Pulmonary/Chest: Effort normal and breath sounds normal. No respiratory distress. She has no wheezes.  Abdominal: Soft. Bowel sounds are normal. She exhibits no distension and no mass. There is no abdominal tenderness. There is  no rebound and no guarding.  Abdominal discomfort on deep palpation  Musculoskeletal:        General: No tenderness or edema. Normal range of motion.     Cervical back: Normal range of motion and neck supple.  Neurological: She is alert and oriented to person, place, and time.  Skin: Skin is warm.  Right and left BREAST exam (in the presence of husband)-right breast no masses felt. ;  Left breast mastectomy/reconstruction noted.  No unusual skin changes or dominant masses felt. Surgical scars noted.    Psychiatric: Affect normal.     LABORATORY DATA:  I have reviewed the data as listed Lab Results  Component Value Date   WBC 14.6 (H) 06/09/2019   HGB 8.1 (L) 06/09/2019   HCT 25.8 (L) 06/09/2019   MCV 93.5 06/09/2019   PLT 83 (L) 06/09/2019   Recent Labs     10/21/18 1613 05/27/19 1435 06/09/19 1114  NA 141 138 138  K 4.6 3.9 3.7  CL 104 107 108  CO2 _0 GLUCOSE 93 95 95  BUN _1 CREATININE 0.86 0.81 0.73  CALCIUM 9.2 8.8* 8.5*  GFRNONAA 77 >60 >60  GFRAA 89 >60 >60  PROT 6.4 6.5 6.6  ALBUMIN 4.2 3.9 3.8  AST 20 46* 42*  ALT 13 38 25  ALKPHOS 124* 309* 334*  BILITOT 0.4 1.1 1.7*     CT ANGIO CHEST PE W OR WO CONTRAST  Result Date: 06/10/2019 CLINICAL DATA:  Pleuritic chest pain.  History of breast cancer. EXAM: CT ANGIOGRAPHY CHEST WITH CONTRAST TECHNIQUE: Multidetector CT imaging of the chest was performed using the standard protocol during bolus administration of intravenous contrast. Multiplanar CT image reconstructions and MIPs were obtained to evaluate the vascular anatomy. CONTRAST:  178m OMNIPAQUE IOHEXOL 350 MG/ML SOLN COMPARISON:  PET scan 2 days ago. FINDINGS: Cardiovascular: Pulmonary arterial opacification is good. There are no pulmonary emboli. Heart size is normal. No aortic atherosclerosis or coronary artery calcification. No pericardial fluid. Mediastinum/Nodes: No mediastinal or hilar lymphadenopathy. Patient does have left axillary adenopathy as shown by PET scan, consistent with recurrence of breast carcinoma. Lungs/Pleura: There is patchy pulmonary parenchymal density at the left lung base consistent with atelectasis/pneumonia. No dense consolidation or lobar collapse. No visible underlying mass lesion. Upper Abdomen: Negative Musculoskeletal: Innumerable sclerotic foci scattered throughout the skeleton consistent with osteoblastic metastatic disease. No evidence of lytic destructive lesion. No sign of extraosseous tumor extension. No pathologic fracture evident. Review of the MIP images confirms the above findings. IMPRESSION: No pulmonary emboli. Left lower lobe atelectasis and or pneumonia. No dense consolidation or lobar collapse. Lymphadenopathy in the left axillary region consistent with metastatic disease  as described by previous PET scan. Innumerable sclerotic bony metastatic lesions without evidence of lytic destructive lesion, extraosseous tumor extension or pathologic fracture These results will be called to the ordering clinician or representative by the Radiologist Assistant, and communication documented in the PACS or [employer] Electronically Signed   By: MNelson ChimesM.D.   On: 06/10/2019 13:40   CT ABDOMEN PELVIS W CONTRAST  Result Date: 05/29/2019 CLINICAL DATA:  Mid abdominal pain for several months, palpable abnormality, loose stool, personal history of left breast cancer status post mastectomy EXAM: CT ABDOMEN AND PELVIS WITH CONTRAST TECHNIQUE: Multidetector CT imaging of the abdomen and pelvis was performed using the standard protocol following bolus administration of intravenous contrast. CONTRAST:  1022mOMNIPAQUE IOHEXOL 300 MG/ML  SOLN COMPARISON:  08/19/2010 FINDINGS:  Lower chest: No acute pleural or parenchymal lung disease. Hepatobiliary: Gallbladder is decompressed, limiting evaluation. Enhancement of the gallbladder mucosa is noted. No evidence of cholelithiasis. No pericholecystic fat stranding. Liver is unremarkable. Pancreas: There is abnormal soft tissue density in fat stranding at the root of mesentery extending to the uncinate process of the pancreas. There is adjacent wall thickening within the duodenal bulb. No discrete mass can be identified. Spleen: Spleen is enlarged measuring 15.2 cm in craniocaudal length. No focal parenchymal abnormality. Adrenals/Urinary Tract: The adrenals appear normal. Kidneys enhance normally and symmetrically. No urinary tract calculi. The bladder is unremarkable. Stomach/Bowel: As stated above, there is circumferential wall thickening of the duodenal bulb, best seen on axial images thirty-three through 42 and on coronal image 55 through 61. There is loss of normal fat plane between the proximal duodenum and pancreatic head. I do not see any  bowel obstruction or ileus. Vascular/Lymphatic: Numerous small lymph nodes are seen at the root of the mesentery, at the area of mesenteric stranding, ill-defined uncinate process of the pancreas, and circumferential duodenal wall thickening. Lymph nodes are all less than 1 cm in size. Vascular structures are grossly unremarkable. No significant aortic atherosclerosis. The splenic vein, portal vein, and superior mesenteric vein enhance normally. Reproductive: Status post hysterectomy. No adnexal masses. Other: No free fluid or free gas.  No abdominal wall hernia. Musculoskeletal: Diffuse sclerotic lesions are seen throughout the axial and appendicular skeleton, consistent with bony metastatic disease. No pathologic fracture. Reconstructed images demonstrate no additional findings. IMPRESSION: 1. Interval development of innumerable sclerotic lesions throughout the axial and appendicular skeleton, consistent with bony metastatic disease. Bone scan may be useful for further evaluation. 2. Abnormal soft tissue density at the root of the mesentery, with loss of normal fat planes between the uncinate process of the pancreatic head/uncinate process and duodenal bulb. Circumferential wall thickening of the proximal duodenum as above. Overall, I would favor neoplasm rather than underlying inflammatory change such as pancreatitis. MRI with without contrast may be useful for further evaluation in this area. 3. Splenomegaly. 4. Numerous subcentimeter lymph nodes at the root of the mesentery, nonspecific. Given the bony findings and adjacent desmoplastic appearance of the mesentery, neoplasm cannot be excluded. PET-CT may be useful for further evaluation. These results will be called to the ordering clinician or representative by the Radiologist Assistant, and communication documented in the PACS or [employer]. Electronically Signed   By: Randa Ngo M.D.   On: 05/29/2019 13:50   NM PET Image Initial (PI) Skull Base  To Thigh  Result Date: 06/09/2019 CLINICAL DATA:  Initial treatment strategy for bone lesions. History of lobular breast carcinoma. Splenomegaly EXAM: NUCLEAR MEDICINE PET SKULL BASE TO THIGH TECHNIQUE: 9.8 mCi F-18 FDG was injected intravenously. Full-ring PET imaging was performed from the skull base to thigh after the radiotracer. CT data was obtained and used for attenuation correction and anatomic localization. Fasting blood glucose: 96 mg/dl COMPARISON:  6 CT 05/29/2019 FINDINGS: Mediastinal blood pool activity: SUV max 2.0 Liver activity: SUV max 3.2 NECK: No hypermetabolic lymph nodes in the neck. Incidental CT findings: none CHEST: Several adjacent enlarged rounded hypermetabolic LEFT axillary lymph nodes are present. Example 16 mm node (image 625/3) with SUV max equal 3.6. Similar adjacent LEFT axillary node SUV max equal 4.1. No hypermetabolic supraclavicular nodes. No hypermetabolic mediastinal lymph nodes. No enlarged mediastinal lymph nodes or hilar nodes. Post LEFT mastectomy anatomy. Prosthetic in the RIGHT breast. No abnormal metabolic activity within the breast tissue/chest  wall. Incidental CT findings: No suspicious pulmonary nodules. Small subpleural LEFT lower lobe pulmonary nodule measures 3 mm (image 103/3). ABDOMEN/PELVIS: No abnormal metabolic activity liver. No hypermetabolic upper abdominal lymph nodes. No hypermetabolic pelvic lymph nodes. The spleen is enlarged but has normal metabolic activity. Spleen measures 13.3 cm in craniocaudad dimension. Incidental CT findings: none SKELETON: There is diffuse sclerotic lesions throughout the pelvis, spine ribs and proximal appendicular skeleton. On the background this diffuse sclerotic metastatic pattern there several foci of discrete hypermetabolic activity. For example within the T9 vertebral body lesion with SUV max equal 4.8. Similar findings in the T4-T5 vertebral bodies. Within the pelvis there scattered foci of mild hypermetabolic  activity. For example the posterior RIGHT iliac bone with SUV max equal 4.3. Incidental CT findings: Diffuse skeletal sclerotic metastasis as above. IMPRESSION: 1. Mild to moderate hypermetabolic LEFT axial lymph nodes are favored local breast carcinoma recurrence. 2. Diffuse small osteoblastic skeletal metastasis in a pattern typical of breast cancer skeletal metastasis. Scattered foci of hypermetabolic activity on the background of sclerotic metastasis consistent with active metastatic disease. Moderate metabolic activity is typical of lobular carcinoma. 3. Enlarged spleen without hypermetabolic activity. Electronically Signed   By: Suzy Bouchard M.D.   On: 06/09/2019 11:49   CT BONE MARROW BIOPSY  Result Date: 06/03/2019 INDICATION: 54 year old with thrombocytopenia, history of breast cancer and diffuse sclerotic bone metastasis. EXAM: CT GUIDED BONE MARROW BIOPSY Physician: Stephan Minister. Anselm Pancoast, MD MEDICATIONS: None. ANESTHESIA/SEDATION: Fentanyl 50 mcg IV; Versed 1 mg IV Moderate Sedation Time:  19 minutes The patient was continuously monitored during the procedure by the interventional radiology nurse under my direct supervision. COMPLICATIONS: None immediate. PROCEDURE: The procedure was explained to the patient. The risks and benefits of the procedure were discussed and the patient's questions were addressed. Informed consent was obtained from the patient. The patient was placed prone on CT table. Images of the pelvis were obtained. The right side of back was prepped and draped in sterile fashion. The skin and right posterior ilium were anesthetized with 1% lidocaine. 11 gauge bone needle was directed into the right ilium with CT guidance. No blood could be obtained for an aspirate. As a result, 2 core biopsies were obtained. Bandage placed over the puncture site. FINDINGS: Extensive sclerotic lesions throughout the pelvis compatible with metastatic disease. IMPRESSION: CT guided bone marrow biopsy.  Electronically Signed   By: Markus Daft M.D.   On: 06/03/2019 13:45    ASSESSMENT & PLAN:   Primary cancer of left breast with metastasis to other site [facility]) #Metastatic carcinoma on bone marrow biopsy; suggestive of recurrent breast cancer/lobular cancer-causing anemia thrombocytopenia.  PET scan 2021 April-left axilla adenopathy; multiple bone lesions; no obvious mass noted in the left upper quadrant of abdomen.  Patient awaiting left axial lymph node biopsy tomorrow.  Discussed this would be stage IV disease given the spread to the bone.  #Patient is currently on Femara.  However if biopsy-proven ER/PR positive HER-2 negative breast cancer-I would recommend Faslodex plus CDK-inhibitor.  #Left chest wall pain-CTA negative for PE.  Question is pleurisy.  S/p Bactrim.  # UTI-E. coli resistant to ciprofloxacin.  S/p bacrim improved.   # Bone metastases- recommend NSAIDs.  Discussed bisphosphonates-discussed the potential side effects of hypocalcemia; osteonecrosis of jaw.  Awaiting insurance approval.  Awaiting dental appointment next week.  # DISPOSITION: # follow up in 1 week-MD;labs- cbc/bmp' hold tube; zometa; faslodex- Dr.B     Cammie Sickle, MD 06/17/2019 9:40 AM

## 2019-06-17 ENCOUNTER — Ambulatory Visit
Admission: RE | Admit: 2019-06-17 | Discharge: 2019-06-17 | Disposition: A | Discharge status: Home / Self Care | Payer: No Typology Code available for payment source | Source: Ambulatory Visit | Attending: Internal Medicine | Admitting: Internal Medicine

## 2019-06-17 DIAGNOSIS — C773 Secondary and unspecified malignant neoplasm of axilla and upper limb lymph nodes: Secondary | ICD-10-CM | POA: Diagnosis not present

## 2019-06-17 DIAGNOSIS — M899 Disorder of bone, unspecified: Secondary | ICD-10-CM | POA: Diagnosis not present

## 2019-06-17 DIAGNOSIS — Z853 Personal history of malignant neoplasm of breast: Secondary | ICD-10-CM | POA: Insufficient documentation

## 2019-06-17 DIAGNOSIS — R590 Localized enlarged lymph nodes: Secondary | ICD-10-CM | POA: Diagnosis present

## 2019-06-17 DIAGNOSIS — C50912 Malignant neoplasm of unspecified site of left female breast: Secondary | ICD-10-CM

## 2019-06-17 MED ORDER — SODIUM CHLORIDE 0.9 % IV SOLN: INTRAVENOUS | Status: DC

## 2019-06-17 NOTE — Discharge Instructions (Signed)
Needle Biopsy, Care After This sheet gives you information about how to care for yourself after your procedure. Your health care provider may also give you more specific instructions. If you have problems or questions, contact your health care provider. What can I expect after the procedure? After the procedure, it is common to have soreness, bruising, or mild pain at the puncture site. This should go away in a few days. Follow these instructions at home: Needle insertion site care   Wash your hands with soap and water before you change your bandage (dressing). If you cannot use soap and water, use hand sanitizer.  Follow instructions from your health care provider about how to take care of your puncture site. This includes: ? When and how to change your dressing. ? When to remove your dressing.  Check your puncture site every day for signs of infection. Check for: ? Redness, swelling, or pain. ? Fluid or blood. ? Pus or a bad smell. ? Warmth. General instructions  Return to your normal activities as told by your health care provider. Ask your health care provider what activities are safe for you.  Do not take baths, swim, or use a hot tub until your health care provider approves. Ask your health care provider if you may take showers. You may only be allowed to take sponge baths.  Take over-the-counter and prescription medicines only as told by your health care provider.  Keep all follow-up visits as told by your health care provider. This is important. Contact a health care provider if:  You have a fever.  You have redness, swelling, or pain at the puncture site that lasts longer than a few days.  You have fluid, blood, or pus coming from your puncture site.  Your puncture site feels warm to the touch. Get help right away if:  You have severe bleeding from the puncture site. Summary  After the procedure, it is common to have soreness, bruising, or mild pain at the puncture  site. This should go away in a few days.  Check your puncture site every day for signs of infection, such as redness, swelling, or pain.  Get help right away if you have severe bleeding from your puncture site. This information is not intended to replace advice given to you by your health care provider. Make sure you discuss any questions you have with your health care provider. Document Revised: 05/03/2017 Document Reviewed: 03/04/2017 Elsevier Patient Education  2020 [employer]  

## 2019-06-17 NOTE — Procedures (Signed)
Interventional Radiology Procedure:   Indications: Breast cancer with new bone lesions and left axillary lymphadenopathy  Procedure: US guided core biopsy  Findings: 5 cores obtained from left axillary lymph node.  Complications: None     EBL: less than 10 ml  Plan: Discharge to home.    Caterine Mcmeans R. Anselm Pancoast, MD  Pager: [phone]

## 2019-06-19 ENCOUNTER — Other Ambulatory Visit: Payer: Self-pay | Admitting: Internal Medicine

## 2019-06-19 LAB — SURGICAL PATHOLOGY

## 2019-06-22 ENCOUNTER — Telehealth: Payer: Self-pay | Admitting: Internal Medicine

## 2019-06-22 NOTE — Telephone Encounter (Signed)
On 4/16-I spoke to patient regarding results of the lymph node biopsy suggestive of breast cancer.  Keep appointment as planned.  H-please send out for foundation 1 testing.  GB

## 2019-06-23 ENCOUNTER — Inpatient Hospital Stay (HOSPITAL_BASED_OUTPATIENT_CLINIC_OR_DEPARTMENT_OTHER): Payer: No Typology Code available for payment source | Admitting: Internal Medicine

## 2019-06-23 ENCOUNTER — Other Ambulatory Visit: Payer: Self-pay

## 2019-06-23 ENCOUNTER — Inpatient Hospital Stay: Payer: No Typology Code available for payment source

## 2019-06-23 ENCOUNTER — Encounter: Payer: Self-pay | Admitting: Internal Medicine

## 2019-06-23 DIAGNOSIS — C50912 Malignant neoplasm of unspecified site of left female breast: Secondary | ICD-10-CM

## 2019-06-23 DIAGNOSIS — C7951 Secondary malignant neoplasm of bone: Secondary | ICD-10-CM

## 2019-06-23 LAB — CBC WITH DIFFERENTIAL/PLATELET
Abs Immature Granulocytes: 0.22 10*3/uL — ABNORMAL HIGH (ref 0.00–0.07)
Basophils Absolute: 0.2 10*3/uL — ABNORMAL HIGH (ref 0.0–0.1)
Basophils Relative: 1 %
Eosinophils Absolute: 1 10*3/uL — ABNORMAL HIGH (ref 0.0–0.5)
Eosinophils Relative: 7 %
HCT: 27.8 % — ABNORMAL LOW (ref 36.0–46.0)
Hemoglobin: 8.4 g/dL — ABNORMAL LOW (ref 12.0–15.0)
Immature Granulocytes: 1 %
Lymphocytes Relative: 39 %
Lymphs Abs: 6.1 10*3/uL — ABNORMAL HIGH (ref 0.7–4.0)
MCH: 29.3 pg (ref 26.0–34.0)
MCHC: 30.2 g/dL (ref 30.0–36.0)
MCV: 96.9 fL (ref 80.0–100.0)
Monocytes Absolute: 0.9 10*3/uL (ref 0.1–1.0)
Monocytes Relative: 6 %
Neutro Abs: 7.2 10*3/uL (ref 1.7–7.7)
Neutrophils Relative %: 46 %
Platelets: 200 10*3/uL (ref 150–400)
RBC: 2.87 MIL/uL — ABNORMAL LOW (ref 3.87–5.11)
RDW: 23 % — ABNORMAL HIGH (ref 11.5–15.5)
WBC: 15.6 10*3/uL — ABNORMAL HIGH (ref 4.0–10.5)
nRBC: 3 % — ABNORMAL HIGH (ref 0.0–0.2)

## 2019-06-23 LAB — BASIC METABOLIC PANEL
Anion gap: 9 (ref 5–15)
BUN: 12 mg/dL (ref 6–20)
CO2: 23 mmol/L (ref 22–32)
Calcium: 8.5 mg/dL — ABNORMAL LOW (ref 8.9–10.3)
Chloride: 105 mmol/L (ref 98–111)
Creatinine, Ser: 0.67 mg/dL (ref 0.44–1.00)
GFR calc Af Amer: >60 mL/min (ref >60)
GFR calc non Af Amer: >60 mL/min (ref >60)
Glucose, Bld: 102 mg/dL — ABNORMAL HIGH (ref 70–99)
Potassium: 4 mmol/L (ref 3.5–5.1)
Sodium: 137 mmol/L (ref 135–145)

## 2019-06-23 LAB — SAMPLE TO BLOOD BANK

## 2019-06-23 MED ORDER — SODIUM CHLORIDE 0.9 % IV SOLN
Freq: Once | INTRAVENOUS | Status: AC
  Filled 2019-06-23: qty 250

## 2019-06-23 MED ORDER — FULVESTRANT 250 MG/5ML IM SOLN
500.0000 mg | Freq: Once | INTRAMUSCULAR | Status: AC
  Administered 2019-06-23: 12:00:00 500 mg via INTRAMUSCULAR
  Filled 2019-06-23: qty 10

## 2019-06-23 MED ORDER — ZOLEDRONIC ACID 4 MG/5ML IV CONC
3.0000 mg | Freq: Once | INTRAVENOUS | Status: AC
  Administered 2019-06-23: 3 mg via INTRAVENOUS
  Filled 2019-06-23: qty 3.75

## 2019-06-23 MED ORDER — ERGOCALCIFEROL 1.25 MG (50000 UT) PO CAPS: 50000.0000 [IU] | ORAL_CAPSULE | ORAL | 1 refills | Status: AC

## 2019-06-23 NOTE — Progress Notes (Signed)
Completed fmla papers faxed to [facility].

## 2019-06-23 NOTE — Assessment & Plan Note (Addendum)
#  Metastatic breast carcinoma-involving the bone marrow/bones; question abdominal involvement [see below]; will get NGS.   #Proceed with Faslodex; discussed the mechanism of action; potential side effects.  Continue provement of platelets/hemoglobin-I would recommend addition of CDK-inhibitor- abema/palbo.   #Abdominal /epigastric discomfort-vague mass noted involving the mesentery on a CT scan; not clearly noted on the PET scan.  I suspect malignancy is causing the pain/lobular cancer involved GI tract.  Recommend evaluation with endoscopies.  Will inform GI.  Asked patient to reach out to GI.  # Bone metastases-proceed with Zometa today.  Decrease the dose to 3 mg; recommend calcium plus vitamin D  # Mild hypocalemia- ok with zometa; reocmmend Vit D+ ca; 50,K   # Prognosis: Discussed the median life expectancy of ER/PR positive HER-2 negative metastatic breast cancer is in the order of 4-5 years.  Patient understands that these numbers do not belong to her in particular.  # DISPOSITION: # Faslodex; Zometa today # follow up in 2 week-MD;labs- cbc/ faslodex- Dr.B

## 2019-06-23 NOTE — Progress Notes (Signed)
Per Nira Conn RN, Dr. B states ok to proceed with zometa today with calcium level of 8.5

## 2019-06-23 NOTE — Progress Notes (Signed)
[place] CONSULT NOTE  Patient Care Team: Malfi, Lupita Raider, FNP as PCP - General (Family Medicine)  CHIEF COMPLAINTS/PURPOSE OF CONSULTATION: Thrombocytopenia/anemia  Oncology History Overview Note  #  May 2010-stage II lobular cancer left breast; [Dr.Choksi] on a protocol received neoadjuvant chemotherapy s/p mastectomy reconstruction/TRAM; right breast augmentation.  Initially tamoxifen until July 2012-then Femara 2.5 mg; stopped because of joint pains; July 2013-Aromasin again stopped because of joint pains [total of 4 years-endocrine therapy].  #Bilateral salpingo-oophorectomy.   #March/April 2021-thrombocytopenia/worsening anemia-bone marrow biopsy-"carcinoma".  ER/PR status HER-2/neu status-NA; March 2021 abdomen pelvis CT scan-vague infiltrative lesion noted duodenal area; June 08, 2019-PET scan left axilla adenopathy; multiple bone lesions.    Primary cancer of left breast with metastasis to other site [facility])  07/05/2008 Initial Diagnosis   History is obtained through chart review from Dr. Metro Kung notes. She was originally diagnosed with breast cancer, invasive lobular carcinoma in May 2010. She was placed on the protocol and received neoadjuvant chemotherapy. She had left mastectomy with reconstruction surgery and right breast augmentation with implant. Subsequently she was placed on tamoxifen until July of 2012, then Femara 2.5 mg.  Femara was stopped because of bony pains July of 2013. She was then switched to Aromasin. Patient has stopped taking Aromasin because of perceived side effect. In total, she had received anti-estrogen therapy for almost 4 years   10/17/2010 Surgery   She had bilateral salpingo-oophorectomy   01/21/2016 Imaging   No MRI evidence of malignancy in the right breast or reconstructed left breast (TRAM flap).     HISTORY OF PRESENTING ILLNESS:  Natasha DONALSON 54 y.o.  female with a prior history of breast cancer lobular cancer stage II;  with anemia thrombocytopenia with involvement of carcinoma in the bone marrow is here for follow-up with regards to left axial lymph node biopsy.  Patient denies any easy bruising or bleeding.  Denies any worsening pain.  Continues to have abdominal/epigastric discomfort.  No headaches.  Review of Systems  Constitutional: Positive for malaise/fatigue. Negative for chills, diaphoresis, fever and weight loss.  HENT: Negative for nosebleeds and sore throat.   Eyes: Negative for double vision.  Respiratory: Negative for cough, hemoptysis, sputum production, shortness of breath and wheezing.   Cardiovascular: Negative for chest pain, palpitations, orthopnea and leg swelling.  Gastrointestinal: Positive for abdominal pain. Negative for blood in stool, constipation, diarrhea, heartburn, melena, nausea and vomiting.  Genitourinary: Negative for dysuria, frequency and urgency.  Musculoskeletal: Negative for back pain and joint pain.  Skin: Negative.  Negative for itching and rash.  Neurological: Negative for dizziness, tingling, focal weakness, weakness and headaches.  Endo/Heme/Allergies: Does not bruise/bleed easily.  Psychiatric/Behavioral: Negative for depression. The patient is not nervous/anxious and does not have insomnia.      MEDICAL HISTORY:  Past Medical History:  Diagnosis Date  . Breast cancer ([place]) 03-17-2009   lt Mastectomy and chemo  . Cancer ([place])   . GERD (gastroesophageal reflux disease)   . History of bone marrow biopsy 06/03/2019    SURGICAL HISTORY: Past Surgical History:  Procedure Laterality Date  . ABDOMINAL HYSTERECTOMY    . AUGMENTATION MAMMAPLASTY Right    RT BREAST IMPLANT ONLY  . MASTECTOMY Left 2011  . OOPHORECTOMY      SOCIAL HISTORY: Social History   Socioeconomic History  . Marital status: Married    Spouse name: Not on file  . Number of children: Not on file  . Years of education: Not on file  .  Highest education level: Not on file   Occupational History  . Not on file  Tobacco Use  . Smoking status: Former Smoker    Quit date: 03/05/1984    Years since quitting: 35.3  . Smokeless tobacco: Never Used  Substance and Sexual Activity  . Alcohol use: No  . Drug use: No  . Sexual activity: Not on file  Other Topics Concern  . Not on file  Social History Narrative  . Not on file   Social Determinants of Health   Financial Resource Strain:   . Difficulty of Paying Living Expenses:   Food Insecurity:   . Worried About Charity fundraiser in the Last Year:   . Arboriculturist in the Last Year:   Transportation Needs:   . Film/video editor (Medical):   Marland Kitchen Lack of Transportation (Non-Medical):   Physical Activity:   . Days of Exercise per Week:   . Minutes of Exercise per Session:   Stress:   . Feeling of Stress :   Social Connections:   . Frequency of Communication with Friends and Family:   . Frequency of Social Gatherings with Friends and Family:   . Attends Religious Services:   . Active Member of Clubs or Organizations:   . Attends Archivist Meetings:   Marland Kitchen Marital Status:   Intimate Partner Violence:   . Fear of Current or Ex-Partner:   . Emotionally Abused:   Marland Kitchen Physically Abused:   . Sexually Abused:     FAMILY HISTORY: Family History  Problem Relation Age of Onset  . Diabetes Mother   . Arthritis Mother   . Heart disease Father   . Breast cancer Neg Hx     ALLERGIES:  is allergic to influenza vaccines.  MEDICATIONS:  Current Outpatient Medications  Medication Sig Dispense Refill  . ALPRAZolam (XANAX) 1 MG tablet Take 1 tablet (1 mg total) by mouth at bedtime as needed for anxiety. 30 tablet 0  . [place]-Glucosamine-Vit D (OSTEO BI-FLEX ONE PER DAY PO) Take by mouth.    . cholecalciferol (VITAMIN D) 1000 units tablet Take 1,000 Units by mouth daily.    Marland Kitchen dexlansoprazole (DEXILANT) 60 MG capsule Take 60 mg by mouth daily.    Marland Kitchen letrozole (FEMARA) 2.5 MG tablet Take 1 tablet  (2.5 mg total) by mouth daily. Once a day. 30 tablet 0  . PARoxetine (PAXIL) 20 MG tablet Take 1 tablet (20 mg total) by mouth daily. 90 tablet 1  . ergocalciferol (VITAMIN D2) 1.25 MG (50000 UT) capsule Take 1 capsule (50,000 Units total) by mouth once a week. 12 capsule 1   No current facility-administered medications for this visit.     Marland Kitchen  PHYSICAL EXAMINATION:   Vitals:   06/23/19 1055  BP: 120/75  Pulse: 90  Resp: 18  Temp: 98.8 F (37.1 C)  SpO2: 98%   Filed Weights   06/23/19 1055  Weight: 184 lb 9.6 oz (83.7 kg)    Physical Exam  Constitutional: She is oriented to person, place, and time and well-developed, well-nourished, and in no distress.  HENT:  Head: Normocephalic and atraumatic.  Mouth/Throat: Oropharynx is clear and moist. No oropharyngeal exudate.  Eyes: Pupils are equal, round, and reactive to light.  Cardiovascular: Normal rate and regular rhythm.  Pulmonary/Chest: Effort normal and breath sounds normal. No respiratory distress. She has no wheezes.  Abdominal: Soft. Bowel sounds are normal. She exhibits no distension and no mass. There is no abdominal  tenderness. There is no rebound and no guarding.  Abdominal discomfort on deep palpation  Musculoskeletal:        General: No tenderness or edema. Normal range of motion.     Cervical back: Normal range of motion and neck supple.  Neurological: She is alert and oriented to person, place, and time.  Skin: Skin is warm.  Right and left BREAST exam (in the presence of husband)-right breast no masses felt. ;  Left breast mastectomy/reconstruction noted.  No unusual skin changes or dominant masses felt. Surgical scars noted.    Psychiatric: Affect normal.     LABORATORY DATA:  I have reviewed the data as listed Lab Results  Component Value Date   WBC 15.6 (H) 06/23/2019   HGB 8.4 (L) 06/23/2019   HCT 27.8 (L) 06/23/2019   MCV 96.9 06/23/2019   PLT 200 06/23/2019   Recent Labs    10/21/18 1613  10/21/18 1613 05/27/19 1435 06/09/19 1114 06/23/19 1030  NA 141  --  138 138 137  K 4.6   < > 3.9 3.7 4.0  CL 104   < > 107 108 105  CO2 22   < > _0 GLUCOSE 93  --  95 95 102*  BUN 14  --  _1 CREATININE 0.86   < > 0.81 0.73 0.67  CALCIUM 9.2   < > 8.8* 8.5* 8.5*  GFRNONAA 77   < > >60 >60 >60  GFRAA 89   < > >60 >60 >60  PROT 6.4  --  6.5 6.6  --   ALBUMIN 4.2  --  3.9 3.8  --   AST 20  --  46* 42*  --   ALT 13  --  38 25  --   ALKPHOS 124*  --  309* 334*  --   BILITOT 0.4  --  1.1 1.7*  --    < > = values in this interval not displayed.     CT ANGIO CHEST PE W OR WO CONTRAST  Result Date: 06/10/2019 CLINICAL DATA:  Pleuritic chest pain.  History of breast cancer. EXAM: CT ANGIOGRAPHY CHEST WITH CONTRAST TECHNIQUE: Multidetector CT imaging of the chest was performed using the standard protocol during bolus administration of intravenous contrast. Multiplanar CT image reconstructions and MIPs were obtained to evaluate the vascular anatomy. CONTRAST:  135m OMNIPAQUE IOHEXOL 350 MG/ML SOLN COMPARISON:  PET scan 2 days ago. FINDINGS: Cardiovascular: Pulmonary arterial opacification is good. There are no pulmonary emboli. Heart size is normal. No aortic atherosclerosis or coronary artery calcification. No pericardial fluid. Mediastinum/Nodes: No mediastinal or hilar lymphadenopathy. Patient does have left axillary adenopathy as shown by PET scan, consistent with recurrence of breast carcinoma. Lungs/Pleura: There is patchy pulmonary parenchymal density at the left lung base consistent with atelectasis/pneumonia. No dense consolidation or lobar collapse. No visible underlying mass lesion. Upper Abdomen: Negative Musculoskeletal: Innumerable sclerotic foci scattered throughout the skeleton consistent with osteoblastic metastatic disease. No evidence of lytic destructive lesion. No sign of extraosseous tumor extension. No pathologic fracture evident. Review of the MIP images confirms  the above findings. IMPRESSION: No pulmonary emboli. Left lower lobe atelectasis and or pneumonia. No dense consolidation or lobar collapse. Lymphadenopathy in the left axillary region consistent with metastatic disease as described by previous PET scan. Innumerable sclerotic bony metastatic lesions without evidence of lytic destructive lesion, extraosseous tumor extension or pathologic fracture These results will be called to the ordering clinician or representative by  the Psychologist, clinical, and communication documented in the PACS or [employer]. Electronically Signed   By: Nelson Chimes M.D.   On: 06/10/2019 13:40   CT ABDOMEN PELVIS W CONTRAST  Result Date: 05/29/2019 CLINICAL DATA:  Mid abdominal pain for several months, palpable abnormality, loose stool, personal history of left breast cancer status post mastectomy EXAM: CT ABDOMEN AND PELVIS WITH CONTRAST TECHNIQUE: Multidetector CT imaging of the abdomen and pelvis was performed using the standard protocol following bolus administration of intravenous contrast. CONTRAST:  174m OMNIPAQUE IOHEXOL 300 MG/ML  SOLN COMPARISON:  08/19/2010 FINDINGS: Lower chest: No acute pleural or parenchymal lung disease. Hepatobiliary: Gallbladder is decompressed, limiting evaluation. Enhancement of the gallbladder mucosa is noted. No evidence of cholelithiasis. No pericholecystic fat stranding. Liver is unremarkable. Pancreas: There is abnormal soft tissue density in fat stranding at the root of mesentery extending to the uncinate process of the pancreas. There is adjacent wall thickening within the duodenal bulb. No discrete mass can be identified. Spleen: Spleen is enlarged measuring 15.2 cm in craniocaudal length. No focal parenchymal abnormality. Adrenals/Urinary Tract: The adrenals appear normal. Kidneys enhance normally and symmetrically. No urinary tract calculi. The bladder is unremarkable. Stomach/Bowel: As stated above, there is circumferential wall  thickening of the duodenal bulb, best seen on axial images thirty-three through 42 and on coronal image 55 through 61. There is loss of normal fat plane between the proximal duodenum and pancreatic head. I do not see any bowel obstruction or ileus. Vascular/Lymphatic: Numerous small lymph nodes are seen at the root of the mesentery, at the area of mesenteric stranding, ill-defined uncinate process of the pancreas, and circumferential duodenal wall thickening. Lymph nodes are all less than 1 cm in size. Vascular structures are grossly unremarkable. No significant aortic atherosclerosis. The splenic vein, portal vein, and superior mesenteric vein enhance normally. Reproductive: Status post hysterectomy. No adnexal masses. Other: No free fluid or free gas.  No abdominal wall hernia. Musculoskeletal: Diffuse sclerotic lesions are seen throughout the axial and appendicular skeleton, consistent with bony metastatic disease. No pathologic fracture. Reconstructed images demonstrate no additional findings. IMPRESSION: 1. Interval development of innumerable sclerotic lesions throughout the axial and appendicular skeleton, consistent with bony metastatic disease. Bone scan may be useful for further evaluation. 2. Abnormal soft tissue density at the root of the mesentery, with loss of normal fat planes between the uncinate process of the pancreatic head/uncinate process and duodenal bulb. Circumferential wall thickening of the proximal duodenum as above. Overall, I would favor neoplasm rather than underlying inflammatory change such as pancreatitis. MRI with without contrast may be useful for further evaluation in this area. 3. Splenomegaly. 4. Numerous subcentimeter lymph nodes at the root of the mesentery, nonspecific. Given the bony findings and adjacent desmoplastic appearance of the mesentery, neoplasm cannot be excluded. PET-CT may be useful for further evaluation. These results will be called to the ordering clinician or  representative by the Radiologist Assistant, and communication documented in the PACS or [employer] Electronically Signed   By: MRanda NgoM.D.   On: 05/29/2019 13:50   NM PET Image Initial (PI) Skull Base To Thigh  Result Date: 06/09/2019 CLINICAL DATA:  Initial treatment strategy for bone lesions. History of lobular breast carcinoma. Splenomegaly EXAM: NUCLEAR MEDICINE PET SKULL BASE TO THIGH TECHNIQUE: 9.8 mCi F-18 FDG was injected intravenously. Full-ring PET imaging was performed from the skull base to thigh after the radiotracer. CT data was obtained and used for attenuation correction and anatomic localization. Fasting  blood glucose: 96 mg/dl COMPARISON:  6 CT 05/29/2019 FINDINGS: Mediastinal blood pool activity: SUV max 2.0 Liver activity: SUV max 3.2 NECK: No hypermetabolic lymph nodes in the neck. Incidental CT findings: none CHEST: Several adjacent enlarged rounded hypermetabolic LEFT axillary lymph nodes are present. Example 16 mm node (image 625/3) with SUV max equal 3.6. Similar adjacent LEFT axillary node SUV max equal 4.1. No hypermetabolic supraclavicular nodes. No hypermetabolic mediastinal lymph nodes. No enlarged mediastinal lymph nodes or hilar nodes. Post LEFT mastectomy anatomy. Prosthetic in the RIGHT breast. No abnormal metabolic activity within the breast tissue/chest wall. Incidental CT findings: No suspicious pulmonary nodules. Small subpleural LEFT lower lobe pulmonary nodule measures 3 mm (image 103/3). ABDOMEN/PELVIS: No abnormal metabolic activity liver. No hypermetabolic upper abdominal lymph nodes. No hypermetabolic pelvic lymph nodes. The spleen is enlarged but has normal metabolic activity. Spleen measures 13.3 cm in craniocaudad dimension. Incidental CT findings: none SKELETON: There is diffuse sclerotic lesions throughout the pelvis, spine ribs and proximal appendicular skeleton. On the background this diffuse sclerotic metastatic pattern there several foci of  discrete hypermetabolic activity. For example within the T9 vertebral body lesion with SUV max equal 4.8. Similar findings in the T4-T5 vertebral bodies. Within the pelvis there scattered foci of mild hypermetabolic activity. For example the posterior RIGHT iliac bone with SUV max equal 4.3. Incidental CT findings: Diffuse skeletal sclerotic metastasis as above. IMPRESSION: 1. Mild to moderate hypermetabolic LEFT axial lymph nodes are favored local breast carcinoma recurrence. 2. Diffuse small osteoblastic skeletal metastasis in a pattern typical of breast cancer skeletal metastasis. Scattered foci of hypermetabolic activity on the background of sclerotic metastasis consistent with active metastatic disease. Moderate metabolic activity is typical of lobular carcinoma. 3. Enlarged spleen without hypermetabolic activity. Electronically Signed   By: Suzy Bouchard M.D.   On: 06/09/2019 11:49   CT BONE MARROW BIOPSY  Result Date: 06/03/2019 INDICATION: 54 year old with thrombocytopenia, history of breast cancer and diffuse sclerotic bone metastasis. EXAM: CT GUIDED BONE MARROW BIOPSY Physician: Stephan Minister. Anselm Pancoast, MD MEDICATIONS: None. ANESTHESIA/SEDATION: Fentanyl 50 mcg IV; Versed 1 mg IV Moderate Sedation Time:  19 minutes The patient was continuously monitored during the procedure by the interventional radiology nurse under my direct supervision. COMPLICATIONS: None immediate. PROCEDURE: The procedure was explained to the patient. The risks and benefits of the procedure were discussed and the patient's questions were addressed. Informed consent was obtained from the patient. The patient was placed prone on CT table. Images of the pelvis were obtained. The right side of back was prepped and draped in sterile fashion. The skin and right posterior ilium were anesthetized with 1% lidocaine. 11 gauge bone needle was directed into the right ilium with CT guidance. No blood could be obtained for an aspirate. As a result, 2  core biopsies were obtained. Bandage placed over the puncture site. FINDINGS: Extensive sclerotic lesions throughout the pelvis compatible with metastatic disease. IMPRESSION: CT guided bone marrow biopsy. Electronically Signed   By: Markus Daft M.D.   On: 06/03/2019 13:45   [place] CORE BIOPSY (LYMPH NODES)  Result Date: 06/17/2019 INDICATION: 54 year old with history of breast cancer. Patient has evidence for recurrent disease with scattered bone lesions and left axillary lymphadenopathy. EXAM: ULTRASOUND-GUIDED CORE BIOPSY OF LEFT AXILLARY LYMPH NODE MEDICATIONS: None. ANESTHESIA/SEDATION: None FLUOROSCOPY TIME:  None COMPLICATIONS: None immediate. PROCEDURE: Informed written consent was obtained from the patient after a thorough discussion of the procedural risks, benefits and alternatives. All questions were addressed. A timeout was performed prior  to the initiation of the procedure. Left axillary region was evaluated with ultrasound. Prominent left axillary lymph node was identified and targeted for biopsy. Left axilla was prepped with chlorhexidine and sterile field was created. Skin and soft tissues were anesthetized with 1% lidocaine. Using ultrasound guidance, 18 gauge core needle was directed into the left axillary lymph node. Total of 5 core biopsies were obtained. Specimens placed in formalin. Bandage placed over the puncture site. FINDINGS: Prominent left axillary lymph nodes. Core biopsy needle confirmed within the biopsied lesion. No significant hematoma following the core biopsies. IMPRESSION: Ultrasound-guided core biopsies of a left axillary lymph node. Electronically Signed   By: Markus Daft M.D.   On: 06/17/2019 15:16    ASSESSMENT & PLAN:   Primary cancer of left breast with metastasis to other site [facility]) #Metastatic breast carcinoma-involving the bone marrow/bones; question abdominal involvement [see below]; will get NGS.   #Proceed with Faslodex; discussed the mechanism of action;  potential side effects.  Continue provement of platelets/hemoglobin-I would recommend addition of CDK-inhibitor- abema/palbo.   #Abdominal /epigastric discomfort-vague mass noted involving the mesentery on a CT scan; not clearly noted on the PET scan.  I suspect malignancy is causing the pain/lobular cancer involved GI tract.  Recommend evaluation with endoscopies.  Will inform GI.  Asked patient to reach out to GI.  # Bone metastases-proceed with Zometa today.  Decrease the dose to 3 mg; recommend calcium plus vitamin D  # Mild hypocalemia- ok with zometa; reocmmend Vit D+ ca; 50,K   # Prognosis: Discussed the median life expectancy of ER/PR positive HER-2 negative metastatic breast cancer is in the order of 4-5 years.  Patient understands that these numbers do not belong to her in particular.  # DISPOSITION: # Faslodex; Zometa today # follow up in 2 week-MD;labs- cbc/ faslodex- Dr.B     Cammie Sickle, MD 06/23/2019 1:30 PM

## 2019-06-23 NOTE — Progress Notes (Signed)
Pt in for follow up, denies any concerns or difficulties today. 

## 2019-06-24 ENCOUNTER — Telehealth: Payer: Self-pay | Admitting: Internal Medicine

## 2019-06-24 NOTE — Telephone Encounter (Signed)
On 4/20-spoke to patient daughter Crystal regarding patient's prognosis [median survival 4 to 5 years]; and the plan of care.  In agreement.  FYI.

## 2019-06-25 ENCOUNTER — Telehealth: Payer: Self-pay | Admitting: Oncology

## 2019-06-25 NOTE — Telephone Encounter (Signed)
Patient called and reports that she started to experience numbness and tingling of her fingertips and around her mouth.  No muscle twitching spasm and cramps. Patient recently had Zometa treatment on 06/23/2019.  Calcium level was 8.5.  Patient reports that she is taking calcium 1000 mg daily.  I discussed with patient that her symptoms are likely secondary to Zometa, due to low calcium level, and severe low calcium level may cause dangerous cardiac arrhythmia.  Recommend patient to take another calcium tablet.  If symptom does not improve, or gets worse, I recommend patient to go to urgent care for further evaluation and get calcium checked.  She voices understanding and appreciates my call.

## 2019-06-26 ENCOUNTER — Telehealth: Payer: Self-pay | Admitting: *Deleted

## 2019-06-26 NOTE — Telephone Encounter (Signed)
Patient called to report numbness and tingling around her mouth and her finger tips. She would like to know if these are normal side effects from her infusions this week.

## 2019-06-28 ENCOUNTER — Telehealth: Payer: Self-pay | Admitting: Internal Medicine

## 2019-06-28 NOTE — Telephone Encounter (Signed)
On 4/23- spoke to pt re: ongoing concerns of tingling and numbness on the mouth-likely secondary to hypo-Calcemia from Zometa infusion.  Recommend vitamin D 50,000 units; calcium 600 mg twice a day.  Recommend Tums 2 to 3 pills a day.  To call if not improved  GB

## 2019-06-29 ENCOUNTER — Encounter: Payer: Self-pay | Admitting: Internal Medicine

## 2019-07-03 ENCOUNTER — Other Ambulatory Visit: Payer: Self-pay | Admitting: Licensed Clinical Social Worker

## 2019-07-03 ENCOUNTER — Other Ambulatory Visit: Payer: Self-pay

## 2019-07-03 ENCOUNTER — Other Ambulatory Visit: Payer: Self-pay | Admitting: Internal Medicine

## 2019-07-03 ENCOUNTER — Inpatient Hospital Stay: Payer: No Typology Code available for payment source | Attending: Internal Medicine

## 2019-07-03 DIAGNOSIS — Z9012 Acquired absence of left breast and nipple: Secondary | ICD-10-CM | POA: Insufficient documentation

## 2019-07-03 DIAGNOSIS — Z170 Estrogen receptor positive status [ER+]: Secondary | ICD-10-CM | POA: Insufficient documentation

## 2019-07-03 DIAGNOSIS — Z9079 Acquired absence of other genital organ(s): Secondary | ICD-10-CM | POA: Insufficient documentation

## 2019-07-03 DIAGNOSIS — Z833 Family history of diabetes mellitus: Secondary | ICD-10-CM | POA: Diagnosis not present

## 2019-07-03 DIAGNOSIS — Z79899 Other long term (current) drug therapy: Secondary | ICD-10-CM | POA: Diagnosis not present

## 2019-07-03 DIAGNOSIS — R109 Unspecified abdominal pain: Secondary | ICD-10-CM | POA: Diagnosis not present

## 2019-07-03 DIAGNOSIS — Z9221 Personal history of antineoplastic chemotherapy: Secondary | ICD-10-CM | POA: Insufficient documentation

## 2019-07-03 DIAGNOSIS — C7951 Secondary malignant neoplasm of bone: Secondary | ICD-10-CM | POA: Insufficient documentation

## 2019-07-03 DIAGNOSIS — Z87891 Personal history of nicotine dependence: Secondary | ICD-10-CM | POA: Insufficient documentation

## 2019-07-03 DIAGNOSIS — Z79811 Long term (current) use of aromatase inhibitors: Secondary | ICD-10-CM | POA: Insufficient documentation

## 2019-07-03 DIAGNOSIS — Z8249 Family history of ischemic heart disease and other diseases of the circulatory system: Secondary | ICD-10-CM | POA: Diagnosis not present

## 2019-07-03 DIAGNOSIS — C50912 Malignant neoplasm of unspecified site of left female breast: Secondary | ICD-10-CM | POA: Insufficient documentation

## 2019-07-03 DIAGNOSIS — Z90710 Acquired absence of both cervix and uterus: Secondary | ICD-10-CM | POA: Insufficient documentation

## 2019-07-03 DIAGNOSIS — N390 Urinary tract infection, site not specified: Secondary | ICD-10-CM

## 2019-07-03 DIAGNOSIS — Z90721 Acquired absence of ovaries, unilateral: Secondary | ICD-10-CM | POA: Diagnosis not present

## 2019-07-03 DIAGNOSIS — Z8261 Family history of arthritis: Secondary | ICD-10-CM | POA: Diagnosis not present

## 2019-07-03 DIAGNOSIS — Z596 Low income: Secondary | ICD-10-CM | POA: Insufficient documentation

## 2019-07-03 LAB — URINALYSIS, COMPLETE (UACMP) WITH MICROSCOPIC
Bilirubin Urine: NEGATIVE
Glucose, UA: NEGATIVE mg/dL
Hgb urine dipstick: NEGATIVE
Ketones, ur: NEGATIVE mg/dL
Nitrite: POSITIVE — AB
Protein, ur: NEGATIVE mg/dL
Specific Gravity, Urine: 1.006 (ref 1.005–1.030)
WBC, UA: >50 WBC/hpf — ABNORMAL HIGH (ref 0–5)
pH: 7 (ref 5.0–8.0)

## 2019-07-03 MED ORDER — SULFAMETHOXAZOLE-TRIMETHOPRIM 800-160 MG PO TABS
1.0000 | ORAL_TABLET | Freq: Two times a day (BID) | ORAL | 0 refills | Status: DC
Start: 2019-07-03 — End: 2019-07-29

## 2019-07-03 NOTE — Progress Notes (Signed)
Spoke with patient. She states that she already spoke with Dr. Rogue Bussing. She is aware that antibiotics are called into pharmacy.

## 2019-07-03 NOTE — Progress Notes (Signed)
UA- positive for infection;   Called in script for bactrim for urine infection; will await cultures.  H-Please infom pt  Of above

## 2019-07-05 LAB — URINE CULTURE: Culture: >=100000 — AB

## 2019-07-07 ENCOUNTER — Inpatient Hospital Stay: Payer: No Typology Code available for payment source | Attending: Internal Medicine

## 2019-07-07 ENCOUNTER — Inpatient Hospital Stay: Payer: No Typology Code available for payment source

## 2019-07-07 ENCOUNTER — Inpatient Hospital Stay (HOSPITAL_BASED_OUTPATIENT_CLINIC_OR_DEPARTMENT_OTHER): Payer: No Typology Code available for payment source | Admitting: Internal Medicine

## 2019-07-07 ENCOUNTER — Encounter: Payer: Self-pay | Admitting: Internal Medicine

## 2019-07-07 ENCOUNTER — Other Ambulatory Visit: Payer: Self-pay

## 2019-07-07 ENCOUNTER — Other Ambulatory Visit: Payer: Self-pay | Admitting: *Deleted

## 2019-07-07 DIAGNOSIS — R1907 Generalized intra-abdominal and pelvic swelling, mass and lump: Secondary | ICD-10-CM | POA: Diagnosis not present

## 2019-07-07 DIAGNOSIS — C50912 Malignant neoplasm of unspecified site of left female breast: Secondary | ICD-10-CM

## 2019-07-07 DIAGNOSIS — C7952 Secondary malignant neoplasm of bone marrow: Secondary | ICD-10-CM | POA: Insufficient documentation

## 2019-07-07 DIAGNOSIS — Z87891 Personal history of nicotine dependence: Secondary | ICD-10-CM | POA: Insufficient documentation

## 2019-07-07 DIAGNOSIS — C7951 Secondary malignant neoplasm of bone: Secondary | ICD-10-CM

## 2019-07-07 DIAGNOSIS — Z87440 Personal history of urinary (tract) infections: Secondary | ICD-10-CM | POA: Insufficient documentation

## 2019-07-07 DIAGNOSIS — Z923 Personal history of irradiation: Secondary | ICD-10-CM | POA: Insufficient documentation

## 2019-07-07 DIAGNOSIS — Z9221 Personal history of antineoplastic chemotherapy: Secondary | ICD-10-CM | POA: Diagnosis not present

## 2019-07-07 DIAGNOSIS — Z833 Family history of diabetes mellitus: Secondary | ICD-10-CM | POA: Insufficient documentation

## 2019-07-07 DIAGNOSIS — Z90710 Acquired absence of both cervix and uterus: Secondary | ICD-10-CM | POA: Diagnosis not present

## 2019-07-07 DIAGNOSIS — Z79899 Other long term (current) drug therapy: Secondary | ICD-10-CM | POA: Insufficient documentation

## 2019-07-07 DIAGNOSIS — Z79811 Long term (current) use of aromatase inhibitors: Secondary | ICD-10-CM | POA: Insufficient documentation

## 2019-07-07 DIAGNOSIS — Z8249 Family history of ischemic heart disease and other diseases of the circulatory system: Secondary | ICD-10-CM | POA: Insufficient documentation

## 2019-07-07 DIAGNOSIS — N390 Urinary tract infection, site not specified: Secondary | ICD-10-CM

## 2019-07-07 DIAGNOSIS — Z9012 Acquired absence of left breast and nipple: Secondary | ICD-10-CM | POA: Insufficient documentation

## 2019-07-07 DIAGNOSIS — Z90721 Acquired absence of ovaries, unilateral: Secondary | ICD-10-CM | POA: Diagnosis not present

## 2019-07-07 DIAGNOSIS — Z8261 Family history of arthritis: Secondary | ICD-10-CM | POA: Diagnosis not present

## 2019-07-07 DIAGNOSIS — Z170 Estrogen receptor positive status [ER+]: Secondary | ICD-10-CM | POA: Insufficient documentation

## 2019-07-07 LAB — CBC WITH DIFFERENTIAL/PLATELET
Abs Immature Granulocytes: 0.24 10*3/uL — ABNORMAL HIGH (ref 0.00–0.07)
Basophils Absolute: 0.2 10*3/uL — ABNORMAL HIGH (ref 0.0–0.1)
Basophils Relative: 1 %
Eosinophils Absolute: 0.4 10*3/uL (ref 0.0–0.5)
Eosinophils Relative: 3 %
HCT: 32.4 % — ABNORMAL LOW (ref 36.0–46.0)
Hemoglobin: 9.5 g/dL — ABNORMAL LOW (ref 12.0–15.0)
Immature Granulocytes: 2 %
Lymphocytes Relative: 51 %
Lymphs Abs: 8.3 10*3/uL — ABNORMAL HIGH (ref 0.7–4.0)
MCH: 28.4 pg (ref 26.0–34.0)
MCHC: 29.3 g/dL — ABNORMAL LOW (ref 30.0–36.0)
MCV: 96.7 fL (ref 80.0–100.0)
Monocytes Absolute: 0.9 10*3/uL (ref 0.1–1.0)
Monocytes Relative: 6 %
Neutro Abs: 5.9 10*3/uL (ref 1.7–7.7)
Neutrophils Relative %: 37 %
Platelets: 274 10*3/uL (ref 150–400)
RBC: 3.35 MIL/uL — ABNORMAL LOW (ref 3.87–5.11)
RDW: 22.5 % — ABNORMAL HIGH (ref 11.5–15.5)
WBC: 16 10*3/uL — ABNORMAL HIGH (ref 4.0–10.5)
nRBC: 2.7 % — ABNORMAL HIGH (ref 0.0–0.2)

## 2019-07-07 MED ORDER — FULVESTRANT 250 MG/5ML IM SOLN
500.0000 mg | Freq: Once | INTRAMUSCULAR | Status: AC
  Administered 2019-07-07: 500 mg via INTRAMUSCULAR
  Filled 2019-07-07: qty 10

## 2019-07-07 NOTE — Progress Notes (Signed)
[facility] CONSULT NOTE  Patient Care Team: Malfi, Lupita Raider, FNP as PCP - General (Family Medicine)  CHIEF COMPLAINTS/PURPOSE OF CONSULTATION: Thrombocytopenia/anemia  Oncology History Overview Note  #  May 2010-stage II lobular cancer left breast; [Dr.Choksi] on a protocol received neoadjuvant chemotherapy s/p mastectomy reconstruction/TRAM; right breast augmentation.  Initially tamoxifen until July 2012-then Femara 2.5 mg; stopped because of joint pains; July 2013-Aromasin again stopped because of joint pains [total of 4 years-endocrine therapy].  #Bilateral salpingo-oophorectomy.   #March/April 2021-thrombocytopenia/worsening anemia-bone marrow biopsy-"carcinoma".  ER/PR status HER-2/neu status-NA; March 2021 abdomen pelvis CT scan-vague infiltrative lesion noted duodenal area; June 08, 2019-PET scan left axilla adenopathy; multiple bone lesions.   #April 2021-left axial lymph node biopsy; positive for breast carcinoma; NGS pending.    # 20th April 2021-Faslodex  # NGS/MOLECULAR TESTS:    # PALLIATIVE CARE EVALUATION:  # PAIN MANAGEMENT:    DIAGNOSIS:   STAGE:         ;  GOALS:  CURRENT/MOST RECENT THERAPY :     Primary cancer of left breast with metastasis to other site [facility])  07/05/2008 Initial Diagnosis   History is obtained through chart review from Dr. Metro Kung notes. She was originally diagnosed with breast cancer, invasive lobular carcinoma in May 2010. She was placed on the protocol and received neoadjuvant chemotherapy. She had left mastectomy with reconstruction surgery and right breast augmentation with implant. Subsequently she was placed on tamoxifen until July of 2012, then Femara 2.5 mg.  Femara was stopped because of bony pains July of 2013. She was then switched to Aromasin. Patient has stopped taking Aromasin because of perceived side effect. In total, she had received anti-estrogen therapy for almost 4 years   10/17/2010 Surgery   She had  bilateral salpingo-oophorectomy   01/21/2016 Imaging   No MRI evidence of malignancy in the right breast or reconstructed left breast (TRAM flap).     HISTORY OF PRESENTING ILLNESS:  Natasha Dennis 54 y.o.  female metastatic lobular cancer involving bone marrow; bone involvement-currently on Faslodex is here for follow-up.  Patient received Zometa at last visit-3 days later noted to have tingling numbness around the mouth.  Improved with calcium plus vitamin D.  Currently resolved.  Also in the interim patient was diagnosed with repeated UTI-currently on Bactrim.  Symptoms are improving.  Complains of fatigue especially with exertion.  Denies any cough.  Continues to have vague abdominal discomfort.   Review of Systems  Constitutional: Positive for malaise/fatigue. Negative for chills, diaphoresis, fever and weight loss.  HENT: Negative for nosebleeds and sore throat.   Eyes: Negative for double vision.  Respiratory: Negative for cough, hemoptysis, sputum production, shortness of breath and wheezing.   Cardiovascular: Negative for chest pain, palpitations, orthopnea and leg swelling.  Gastrointestinal: Positive for abdominal pain. Negative for blood in stool, constipation, diarrhea, heartburn, melena, nausea and vomiting.  Genitourinary: Negative for dysuria, frequency and urgency.  Musculoskeletal: Negative for back pain and joint pain.  Skin: Negative.  Negative for itching and rash.  Neurological: Negative for dizziness, tingling, focal weakness, weakness and headaches.  Endo/Heme/Allergies: Does not bruise/bleed easily.  Psychiatric/Behavioral: Negative for depression. The patient is not nervous/anxious and does not have insomnia.      MEDICAL HISTORY:  Past Medical History:  Diagnosis Date  . Breast cancer ([place]) 03-17-2009   lt Mastectomy and chemo  . Cancer ([place])   . GERD (gastroesophageal reflux disease)   . History of bone marrow biopsy 06/03/2019  SURGICAL  HISTORY: Past Surgical History:  Procedure Laterality Date  . ABDOMINAL HYSTERECTOMY    . AUGMENTATION MAMMAPLASTY Right    RT BREAST IMPLANT ONLY  . MASTECTOMY Left 2011  . OOPHORECTOMY      SOCIAL HISTORY: Social History   Socioeconomic History  . Marital status: Married    Spouse name: Not on file  . Number of children: Not on file  . Years of education: Not on file  . Highest education level: Not on file  Occupational History  . Not on file  Tobacco Use  . Smoking status: Former Smoker    Quit date: 03/05/1984    Years since quitting: 35.3  . Smokeless tobacco: Never Used  Substance and Sexual Activity  . Alcohol use: No  . Drug use: No  . Sexual activity: Not on file  Other Topics Concern  . Not on file  Social History Narrative  . Not on file   Social Determinants of Health   Financial Resource Strain:   . Difficulty of Paying Living Expenses:   Food Insecurity:   . Worried About Charity fundraiser in the Last Year:   . Arboriculturist in the Last Year:   Transportation Needs:   . Film/video editor (Medical):   Marland Kitchen Lack of Transportation (Non-Medical):   Physical Activity:   . Days of Exercise per Week:   . Minutes of Exercise per Session:   Stress:   . Feeling of Stress :   Social Connections:   . Frequency of Communication with Friends and Family:   . Frequency of Social Gatherings with Friends and Family:   . Attends Religious Services:   . Active Member of Clubs or Organizations:   . Attends Archivist Meetings:   Marland Kitchen Marital Status:   Intimate Partner Violence:   . Fear of Current or Ex-Partner:   . Emotionally Abused:   Marland Kitchen Physically Abused:   . Sexually Abused:     FAMILY HISTORY: Family History  Problem Relation Age of Onset  . Diabetes Mother   . Arthritis Mother   . Heart disease Father   . Breast cancer Neg Hx     ALLERGIES:  is allergic to influenza vaccines.  MEDICATIONS:  Current Outpatient Medications   Medication Sig Dispense Refill  . ALPRAZolam (XANAX) 1 MG tablet Take 1 tablet (1 mg total) by mouth at bedtime as needed for anxiety. 30 tablet 0  . [place]-Glucosamine-Vit D (OSTEO BI-FLEX ONE PER DAY PO) Take by mouth.    . cholecalciferol (VITAMIN D) 1000 units tablet Take 1,000 Units by mouth daily.    Marland Kitchen dexlansoprazole (DEXILANT) 60 MG capsule Take 60 mg by mouth daily.    . ergocalciferol (VITAMIN D2) 1.25 MG (50000 UT) capsule Take 1 capsule (50,000 Units total) by mouth once a week. 12 capsule 1  . letrozole (FEMARA) 2.5 MG tablet Take 1 tablet (2.5 mg total) by mouth daily. Once a day. 30 tablet 0  . PARoxetine (PAXIL) 20 MG tablet Take 1 tablet (20 mg total) by mouth daily. 90 tablet 1  . sulfamethoxazole-trimethoprim (BACTRIM DS) 800-160 MG tablet Take 1 tablet by mouth 2 (two) times daily. 10 tablet 0   No current facility-administered medications for this visit.     Marland Kitchen  PHYSICAL EXAMINATION:   Vitals:   07/07/19 1055  BP: 121/76  Pulse: 87  Resp: 18  Temp: (!) 95.4 F (35.2 C)   Filed Weights   07/07/19 1055  Weight: 183 lb 12.8 oz (83.4 kg)    Physical Exam  Constitutional: She is oriented to person, place, and time and well-developed, well-nourished, and in no distress.  HENT:  Head: Normocephalic and atraumatic.  Mouth/Throat: Oropharynx is clear and moist. No oropharyngeal exudate.  Eyes: Pupils are equal, round, and reactive to light.  Cardiovascular: Normal rate and regular rhythm.  Pulmonary/Chest: Effort normal and breath sounds normal. No respiratory distress. She has no wheezes.  Abdominal: Soft. Bowel sounds are normal. She exhibits no distension and no mass. There is no abdominal tenderness. There is no rebound and no guarding.  Abdominal discomfort on deep palpation  Musculoskeletal:        General: No tenderness or edema. Normal range of motion.     Cervical back: Normal range of motion and neck supple.  Neurological: She is alert and  oriented to person, place, and time.  Skin: Skin is warm.      Psychiatric: Affect normal.     LABORATORY DATA:  I have reviewed the data as listed Lab Results  Component Value Date   WBC 16.0 (H) 07/07/2019   HGB 9.5 (L) 07/07/2019   HCT 32.4 (L) 07/07/2019   MCV 96.7 07/07/2019   PLT 274 07/07/2019   Recent Labs    10/21/18 1613 10/21/18 1613 05/27/19 1435 06/09/19 1114 06/23/19 1030  NA 141  --  138 138 137  K 4.6   < > 3.9 3.7 4.0  CL 104   < > 107 108 105  CO2 22   < > _0 GLUCOSE 93  --  95 95 102*  BUN 14  --  _1 CREATININE 0.86   < > 0.81 0.73 0.67  CALCIUM 9.2   < > 8.8* 8.5* 8.5*  GFRNONAA 77   < > >60 >60 >60  GFRAA 89   < > >60 >60 >60  PROT 6.4  --  6.5 6.6  --   ALBUMIN 4.2  --  3.9 3.8  --   AST 20  --  46* 42*  --   ALT 13  --  38 25  --   ALKPHOS 124*  --  309* 334*  --   BILITOT 0.4  --  1.1 1.7*  --    < > = values in this interval not displayed.     CT ANGIO CHEST PE W OR WO CONTRAST  Result Date: 06/10/2019 CLINICAL DATA:  Pleuritic chest pain.  History of breast cancer. EXAM: CT ANGIOGRAPHY CHEST WITH CONTRAST TECHNIQUE: Multidetector CT imaging of the chest was performed using the standard protocol during bolus administration of intravenous contrast. Multiplanar CT image reconstructions and MIPs were obtained to evaluate the vascular anatomy. CONTRAST:  197m OMNIPAQUE IOHEXOL 350 MG/ML SOLN COMPARISON:  PET scan 2 days ago. FINDINGS: Cardiovascular: Pulmonary arterial opacification is good. There are no pulmonary emboli. Heart size is normal. No aortic atherosclerosis or coronary artery calcification. No pericardial fluid. Mediastinum/Nodes: No mediastinal or hilar lymphadenopathy. Patient does have left axillary adenopathy as shown by PET scan, consistent with recurrence of breast carcinoma. Lungs/Pleura: There is patchy pulmonary parenchymal density at the left lung base consistent with atelectasis/pneumonia. No dense consolidation or  lobar collapse. No visible underlying mass lesion. Upper Abdomen: Negative Musculoskeletal: Innumerable sclerotic foci scattered throughout the skeleton consistent with osteoblastic metastatic disease. No evidence of lytic destructive lesion. No sign of extraosseous tumor extension. No pathologic fracture evident. Review of the MIP images confirms  the above findings. IMPRESSION: No pulmonary emboli. Left lower lobe atelectasis and or pneumonia. No dense consolidation or lobar collapse. Lymphadenopathy in the left axillary region consistent with metastatic disease as described by previous PET scan. Innumerable sclerotic bony metastatic lesions without evidence of lytic destructive lesion, extraosseous tumor extension or pathologic fracture These results will be called to the ordering clinician or representative by the Radiologist Assistant, and communication documented in the PACS or [employer]. Electronically Signed   By: Nelson Chimes M.D.   On: 06/10/2019 13:40   NM PET Image Initial (PI) Skull Base To Thigh  Result Date: 06/09/2019 CLINICAL DATA:  Initial treatment strategy for bone lesions. History of lobular breast carcinoma. Splenomegaly EXAM: NUCLEAR MEDICINE PET SKULL BASE TO THIGH TECHNIQUE: 9.8 mCi F-18 FDG was injected intravenously. Full-ring PET imaging was performed from the skull base to thigh after the radiotracer. CT data was obtained and used for attenuation correction and anatomic localization. Fasting blood glucose: 96 mg/dl COMPARISON:  6 CT 05/29/2019 FINDINGS: Mediastinal blood pool activity: SUV max 2.0 Liver activity: SUV max 3.2 NECK: No hypermetabolic lymph nodes in the neck. Incidental CT findings: none CHEST: Several adjacent enlarged rounded hypermetabolic LEFT axillary lymph nodes are present. Example 16 mm node (image 625/3) with SUV max equal 3.6. Similar adjacent LEFT axillary node SUV max equal 4.1. No hypermetabolic supraclavicular nodes. No hypermetabolic mediastinal  lymph nodes. No enlarged mediastinal lymph nodes or hilar nodes. Post LEFT mastectomy anatomy. Prosthetic in the RIGHT breast. No abnormal metabolic activity within the breast tissue/chest wall. Incidental CT findings: No suspicious pulmonary nodules. Small subpleural LEFT lower lobe pulmonary nodule measures 3 mm (image 103/3). ABDOMEN/PELVIS: No abnormal metabolic activity liver. No hypermetabolic upper abdominal lymph nodes. No hypermetabolic pelvic lymph nodes. The spleen is enlarged but has normal metabolic activity. Spleen measures 13.3 cm in craniocaudad dimension. Incidental CT findings: none SKELETON: There is diffuse sclerotic lesions throughout the pelvis, spine ribs and proximal appendicular skeleton. On the background this diffuse sclerotic metastatic pattern there several foci of discrete hypermetabolic activity. For example within the T9 vertebral body lesion with SUV max equal 4.8. Similar findings in the T4-T5 vertebral bodies. Within the pelvis there scattered foci of mild hypermetabolic activity. For example the posterior RIGHT iliac bone with SUV max equal 4.3. Incidental CT findings: Diffuse skeletal sclerotic metastasis as above. IMPRESSION: 1. Mild to moderate hypermetabolic LEFT axial lymph nodes are favored local breast carcinoma recurrence. 2. Diffuse small osteoblastic skeletal metastasis in a pattern typical of breast cancer skeletal metastasis. Scattered foci of hypermetabolic activity on the background of sclerotic metastasis consistent with active metastatic disease. Moderate metabolic activity is typical of lobular carcinoma. 3. Enlarged spleen without hypermetabolic activity. Electronically Signed   By: Suzy Bouchard M.D.   On: 06/09/2019 11:49   [place] CORE BIOPSY (LYMPH NODES)  Result Date: 06/17/2019 INDICATION: 54 year old with history of breast cancer. Patient has evidence for recurrent disease with scattered bone lesions and left axillary lymphadenopathy. EXAM:  ULTRASOUND-GUIDED CORE BIOPSY OF LEFT AXILLARY LYMPH NODE MEDICATIONS: None. ANESTHESIA/SEDATION: None FLUOROSCOPY TIME:  None COMPLICATIONS: None immediate. PROCEDURE: Informed written consent was obtained from the patient after a thorough discussion of the procedural risks, benefits and alternatives. All questions were addressed. A timeout was performed prior to the initiation of the procedure. Left axillary region was evaluated with ultrasound. Prominent left axillary lymph node was identified and targeted for biopsy. Left axilla was prepped with chlorhexidine and sterile field was created. Skin and soft tissues were  anesthetized with 1% lidocaine. Using ultrasound guidance, 18 gauge core needle was directed into the left axillary lymph node. Total of 5 core biopsies were obtained. Specimens placed in formalin. Bandage placed over the puncture site. FINDINGS: Prominent left axillary lymph nodes. Core biopsy needle confirmed within the biopsied lesion. No significant hematoma following the core biopsies. IMPRESSION: Ultrasound-guided core biopsies of a left axillary lymph node. Electronically Signed   By: Markus Daft M.D.   On: 06/17/2019 15:16    ASSESSMENT & PLAN:   Primary cancer of left breast with metastasis to other site [facility]) #Metastatic breast carcinoma-involving the bone marrow/bones; question abdominal involvement [see below]; await NGS.  Currently on Faslodex.  #Proceed with Faslodex today.  Significant clinical improvement noted in platelet count/hemoglobin.  Monitor closely.  We will plan to add CDK inhibitor if continued hematologic improvement noted.  #Abdominal /epigastric discomfort-vague mass noted involving the mesentery on a CT scan; not clearly noted on the PET scan.  Stable.  Would recommend further evaluation with endoscopy.  Discussed with Dr. Allen Norris.  Recommend patient follow-up with GI office.  #Recurrent UTIs-s/p Bactrim-symptoms improving.  In the context of lobular breast  cancer would recommend evaluation with urology.  Referral made.  #Tingling and numbness perioral-likely secondary hypocalcemia/Zometa; currently improved.  Continue vitamin D 50,000 units weekly; calcium twice a day.  # Bone metastases-on Zometa 3 mg; monitor closely for hypercalcemia/side effects.  # DISPOSITION: # referral to Dr.Brandon  re: repeated UTIs/metatstatic breast cnacer # Faslodex today # follow up in 4 week-MD;labs- cbc/cmp faslodex; Zometa- Dr.B     Cammie Sickle, MD 07/07/2019 2:23 PM

## 2019-07-07 NOTE — Assessment & Plan Note (Addendum)
#  Metastatic breast carcinoma-involving the bone marrow/bones; question abdominal involvement [see below]; await NGS.  Currently on Faslodex.  #Proceed with Faslodex today.  Significant clinical improvement noted in platelet count/hemoglobin.  Monitor closely.  We will plan to add CDK inhibitor if continued hematologic improvement noted.  #Abdominal /epigastric discomfort-vague mass noted involving the mesentery on a CT scan; not clearly noted on the PET scan.  Stable.  Would recommend further evaluation with endoscopy.  Discussed with Dr. Allen Norris.  Recommend patient follow-up with GI office.  #Recurrent UTIs-s/p Bactrim-symptoms improving.  In the context of lobular breast cancer would recommend evaluation with urology.  Referral made.  #Tingling and numbness perioral-likely secondary hypocalcemia/Zometa; currently improved.  Continue vitamin D 50,000 units weekly; calcium twice a day.  # Bone metastases-on Zometa 3 mg; monitor closely for hypercalcemia/side effects.  # DISPOSITION: # referral to Dr.Brandon  re: repeated UTIs/metatstatic breast cnacer # Faslodex today # follow up in 4 week-MD;labs- cbc/cmp faslodex; Zometa- Dr.B

## 2019-07-07 NOTE — Progress Notes (Signed)
Pt in for follow up, niece is with her today. Denies any concerns today.  States on Bactrim for UTI since Friday and has improved.

## 2019-07-14 ENCOUNTER — Other Ambulatory Visit: Payer: Self-pay

## 2019-07-14 ENCOUNTER — Telehealth: Payer: Self-pay

## 2019-07-14 DIAGNOSIS — K2100 Gastro-esophageal reflux disease with esophagitis, without bleeding: Secondary | ICD-10-CM

## 2019-07-14 NOTE — Telephone Encounter (Signed)
LVM for pt to return my call to schedule EGD.

## 2019-07-15 ENCOUNTER — Encounter: Payer: Self-pay | Admitting: Internal Medicine

## 2019-07-15 ENCOUNTER — Other Ambulatory Visit
Admission: RE | Admit: 2019-07-15 | Discharge: 2019-07-15 | Disposition: A | Discharge status: Home / Self Care | Payer: No Typology Code available for payment source | Source: Ambulatory Visit | Attending: Gastroenterology | Admitting: Gastroenterology

## 2019-07-15 DIAGNOSIS — Z01812 Encounter for preprocedural laboratory examination: Secondary | ICD-10-CM | POA: Insufficient documentation

## 2019-07-15 DIAGNOSIS — Z20822 Contact with and (suspected) exposure to covid-19: Secondary | ICD-10-CM | POA: Diagnosis not present

## 2019-07-15 LAB — SARS CORONAVIRUS 2 (TAT 6-24 HRS): SARS Coronavirus 2: NEGATIVE

## 2019-07-17 ENCOUNTER — Ambulatory Visit: Payer: No Typology Code available for payment source | Admitting: Anesthesiology

## 2019-07-17 ENCOUNTER — Other Ambulatory Visit: Payer: Self-pay

## 2019-07-17 ENCOUNTER — Ambulatory Visit
Admission: RE | Admit: 2019-07-17 | Discharge: 2019-07-17 | Disposition: A | Discharge status: Home / Self Care | Payer: No Typology Code available for payment source | Attending: Gastroenterology | Admitting: Gastroenterology

## 2019-07-17 ENCOUNTER — Encounter: Payer: Self-pay | Admitting: Family Medicine

## 2019-07-17 ENCOUNTER — Encounter
Admission: RE | Disposition: A | Discharge status: Home / Self Care | Payer: Self-pay | Source: Home / Self Care | Attending: Gastroenterology

## 2019-07-17 ENCOUNTER — Encounter: Payer: Self-pay | Admitting: Gastroenterology

## 2019-07-17 ENCOUNTER — Other Ambulatory Visit: Payer: Self-pay | Admitting: Family Medicine

## 2019-07-17 DIAGNOSIS — R12 Heartburn: Secondary | ICD-10-CM

## 2019-07-17 DIAGNOSIS — K2100 Gastro-esophageal reflux disease with esophagitis, without bleeding: Secondary | ICD-10-CM | POA: Diagnosis not present

## 2019-07-17 DIAGNOSIS — C50912 Malignant neoplasm of unspecified site of left female breast: Secondary | ICD-10-CM | POA: Insufficient documentation

## 2019-07-17 DIAGNOSIS — K219 Gastro-esophageal reflux disease without esophagitis: Secondary | ICD-10-CM | POA: Insufficient documentation

## 2019-07-17 DIAGNOSIS — Z87891 Personal history of nicotine dependence: Secondary | ICD-10-CM | POA: Insufficient documentation

## 2019-07-17 DIAGNOSIS — Z9221 Personal history of antineoplastic chemotherapy: Secondary | ICD-10-CM | POA: Diagnosis not present

## 2019-07-17 DIAGNOSIS — C163 Malignant neoplasm of pyloric antrum: Secondary | ICD-10-CM | POA: Insufficient documentation

## 2019-07-17 DIAGNOSIS — N390 Urinary tract infection, site not specified: Secondary | ICD-10-CM

## 2019-07-17 DIAGNOSIS — F419 Anxiety disorder, unspecified: Secondary | ICD-10-CM | POA: Insufficient documentation

## 2019-07-17 DIAGNOSIS — K2970 Gastritis, unspecified, without bleeding: Secondary | ICD-10-CM

## 2019-07-17 DIAGNOSIS — R6881 Early satiety: Secondary | ICD-10-CM | POA: Diagnosis not present

## 2019-07-17 DIAGNOSIS — K449 Diaphragmatic hernia without obstruction or gangrene: Secondary | ICD-10-CM | POA: Diagnosis not present

## 2019-07-17 DIAGNOSIS — C170 Malignant neoplasm of duodenum: Secondary | ICD-10-CM | POA: Diagnosis not present

## 2019-07-17 DIAGNOSIS — Z79899 Other long term (current) drug therapy: Secondary | ICD-10-CM | POA: Insufficient documentation

## 2019-07-17 DIAGNOSIS — Z79811 Long term (current) use of aromatase inhibitors: Secondary | ICD-10-CM | POA: Insufficient documentation

## 2019-07-17 HISTORY — PX: ESOPHAGOGASTRODUODENOSCOPY (EGD) WITH PROPOFOL: SHX5813

## 2019-07-17 SURGERY — ESOPHAGOGASTRODUODENOSCOPY (EGD) WITH PROPOFOL
Anesthesia: General

## 2019-07-17 MED ORDER — GLYCOPYRROLATE 0.2 MG/ML IJ SOLN
INTRAMUSCULAR | Status: AC
  Filled 2019-07-17: qty 1

## 2019-07-17 MED ORDER — SODIUM CHLORIDE 0.9 % IV SOLN
INTRAVENOUS | Status: DC
  Administered 2019-07-17: 1000 mL via INTRAVENOUS

## 2019-07-17 MED ORDER — LIDOCAINE HCL (CARDIAC) PF 100 MG/5ML IV SOSY
PREFILLED_SYRINGE | INTRAVENOUS | Status: DC | PRN
  Administered 2019-07-17: 50 mg via INTRAVENOUS

## 2019-07-17 MED ORDER — PROPOFOL 10 MG/ML IV BOLUS
INTRAVENOUS | Status: DC | PRN
  Administered 2019-07-17 (×2): 20 mg via INTRAVENOUS
  Administered 2019-07-17: 80 mg via INTRAVENOUS

## 2019-07-17 MED ORDER — ONDANSETRON HCL 4 MG/2ML IJ SOLN
INTRAMUSCULAR | Status: AC
  Filled 2019-07-17: qty 2

## 2019-07-17 MED ORDER — LIDOCAINE HCL (PF) 2 % IJ SOLN
INTRAMUSCULAR | Status: AC
  Filled 2019-07-17: qty 5

## 2019-07-17 MED ORDER — ONDANSETRON HCL 4 MG/2ML IJ SOLN
INTRAMUSCULAR | Status: DC | PRN
  Administered 2019-07-17: 4 mg via INTRAVENOUS

## 2019-07-17 MED ORDER — DEXILANT 60 MG PO CPDR: 60.0000 mg | DELAYED_RELEASE_CAPSULE | Freq: Every day | ORAL | 11 refills | Status: AC

## 2019-07-17 MED ORDER — PROPOFOL 10 MG/ML IV BOLUS
INTRAVENOUS | Status: AC
  Filled 2019-07-17: qty 20

## 2019-07-17 NOTE — Anesthesia Preprocedure Evaluation (Addendum)
Anesthesia Evaluation  Patient identified by MRN, date of birth, ID band Patient awake    Reviewed: Allergy & Precautions, H&P , NPO status , Patient's Chart, lab work & pertinent test results, reviewed documented beta blocker date and time   History of Anesthesia Complications Negative for: history of anesthetic complications  Airway Mallampati: II  TM Distance: >3 FB Neck ROM: full    Dental  (+) Dental Advidsory Given, Caps, Teeth Intact   Pulmonary neg pulmonary ROS, former smoker,    Pulmonary exam normal breath sounds clear to auscultation       Cardiovascular Exercise Tolerance: Good negative cardio ROS Normal cardiovascular exam Rhythm:regular Rate:Normal     Neuro/Psych PSYCHIATRIC DISORDERS Anxiety negative neurological ROS     GI/Hepatic Neg liver ROS, GERD  ,  Endo/Other  negative endocrine ROS  Renal/GU negative Renal ROS  negative genitourinary   Musculoskeletal   Abdominal   Peds  Hematology negative hematology ROS (+)   Anesthesia Other Findings Past Medical History: 03-17-2009: Breast cancer ([place])     Comment:  lt Mastectomy and chemo No date: Cancer ([place]) No date: GERD (gastroesophageal reflux disease) 06/03/2019: History of bone marrow biopsy   Reproductive/Obstetrics negative OB ROS                            Anesthesia Physical Anesthesia Plan  ASA: II  Anesthesia Plan: General   Post-op Pain Management:    Induction: Intravenous  PONV Risk Score and Plan: 3 and Propofol infusion and TIVA  Airway Management Planned: Natural Airway and Nasal Cannula  Additional Equipment:   Intra-op Plan:   Post-operative Plan:   Informed Consent: I have reviewed the patients History and Physical, chart, labs and discussed the procedure including the risks, benefits and alternatives for the proposed anesthesia with the patient or authorized representative who has  indicated his/her understanding and acceptance.     Dental Advisory Given  Plan Discussed with: Anesthesiologist, CRNA and Surgeon  Anesthesia Plan Comments:         Anesthesia Quick Evaluation

## 2019-07-17 NOTE — Transfer of Care (Signed)
Immediate Anesthesia Transfer of Care Note  Patient: Natasha Dennis  Procedure(s) Performed: ESOPHAGOGASTRODUODENOSCOPY (EGD) WITH PROPOFOL (N/A )  Patient Location: Endoscopy Unit  Anesthesia Type:General  Level of Consciousness: drowsy and patient cooperative  Airway & Oxygen Therapy: Patient Spontanous Breathing  Post-op Assessment: Report given to RN and Post -op Vital signs reviewed and stable  Post vital signs: Reviewed and stable  Last Vitals:  Vitals Value Taken Time  BP 106/75 07/17/19 0819  Temp 36.2 C 07/17/19 0818  Pulse 92 07/17/19 0819  Resp 15 07/17/19 0819  SpO2 96 % 07/17/19 0819  Vitals shown include unvalidated device data.  Last Pain:  Vitals:   07/17/19 0818  TempSrc: Temporal  PainSc: 0-No pain         Complications: No apparent anesthesia complications

## 2019-07-17 NOTE — Anesthesia Postprocedure Evaluation (Signed)
Anesthesia Post Note  Patient: Natasha Dennis  Procedure(s) Performed: ESOPHAGOGASTRODUODENOSCOPY (EGD) WITH PROPOFOL (N/A )  Patient location during evaluation: Endoscopy Anesthesia Type: General Level of consciousness: awake and alert Pain management: pain level controlled Vital Signs Assessment: post-procedure vital signs reviewed and stable Respiratory status: spontaneous breathing, nonlabored ventilation, respiratory function stable and patient connected to nasal cannula oxygen Cardiovascular status: blood pressure returned to baseline and stable Postop Assessment: no apparent nausea or vomiting Anesthetic complications: no     Last Vitals:  Vitals:   07/17/19 0818 07/17/19 0819  BP: 106/75 106/75  Pulse:    Resp:  16  Temp: (!) 36.2 C   SpO2:  97%    Last Pain:  Vitals:   07/17/19 0848  TempSrc:   PainSc: 0-No pain                 Martha Clan

## 2019-07-17 NOTE — H&P (Signed)
Natasha Lame, MD [address] [place], [place] [address] [phone] Fax : [phone]  Primary Care Physician:  Natasha Bangs, FNP Primary Gastroenterologist:  Dr. Allen Dennis  Pre-Procedure History & Physical: HPI:  Natasha Dennis is a 54 y.o. female is here for an endoscopy.   Past Medical History:  Diagnosis Date  . Breast cancer ([place]) 03-17-2009   lt Mastectomy and chemo  . Cancer ([place])   . GERD (gastroesophageal reflux disease)   . History of bone marrow biopsy 06/03/2019    Past Surgical History:  Procedure Laterality Date  . ABDOMINAL HYSTERECTOMY    . AUGMENTATION MAMMAPLASTY Right    RT BREAST IMPLANT ONLY  . MASTECTOMY Left 2011  . OOPHORECTOMY      Prior to Admission medications   Medication Sig Start Date End Date Taking? Authorizing Provider  Boswellia-Glucosamine-Vit D (OSTEO BI-FLEX ONE PER DAY PO) Take by mouth.   Yes [provider]  cholecalciferol (VITAMIN D) 1000 units tablet Take 1,000 Units by mouth daily.   Yes [provider]  dexlansoprazole (DEXILANT) 60 MG capsule Take 60 mg by mouth daily.   Yes [provider]  ergocalciferol (VITAMIN D2) 1.25 MG (50000 UT) capsule Take 1 capsule (50,000 Units total) by mouth once a week. 06/23/19  Yes Natasha Sickle, MD  letrozole [facility]) 2.5 MG tablet Take 1 tablet (2.5 mg total) by mouth daily. Once a day. 06/09/19  Yes Natasha Sickle, MD  PARoxetine (PAXIL) 20 MG tablet Take 1 tablet (20 mg total) by mouth daily. 05/11/19  Yes Malfi, Lupita Raider, FNP  ALPRAZolam Duanne Moron) 1 MG tablet Take 1 tablet (1 mg total) by mouth at bedtime as needed for anxiety. 06/12/19   Malfi, Lupita Raider, FNP  sulfamethoxazole-trimethoprim (BACTRIM DS) 800-160 MG tablet Take 1 tablet by mouth 2 (two) times daily. Patient not taking: Reported on 07/17/2019 07/03/19   Natasha Sickle, MD    Allergies as of 07/14/2019 - Review Complete 06/23/2019  Allergen Reaction Noted  .  Influenza vaccines Swelling 02/08/2017    Family History  Problem Relation Age of Onset  . Diabetes Mother   . Arthritis Mother   . Heart disease Father   . Breast cancer Neg Hx     Social History   Socioeconomic History  . Marital status: Married    Spouse name: Not on file  . Number of children: Not on file  . Years of education: Not on file  . Highest education level: Not on file  Occupational History  . Not on file  Tobacco Use  . Smoking status: Former Smoker    Quit date: 03/05/1984    Years since quitting: 35.3  . Smokeless tobacco: Never Used  Substance and Sexual Activity  . Alcohol use: No  . Drug use: No  . Sexual activity: Not on file  Other Topics Concern  . Not on file  Social History Narrative  . Not on file   Social Determinants of Health   Financial Resource Strain:   . Difficulty of Paying Living Expenses:   Food Insecurity:   . Worried About Charity fundraiser in the Last Year:   . Arboriculturist in the Last Year:   Transportation Needs:   . Film/video editor (Medical):   Marland Kitchen Lack of Transportation (Non-Medical):   Physical Activity:   . Days of Exercise per Week:   . Minutes of Exercise per Session:   Stress:   .  Feeling of Stress :   Social Connections:   . Frequency of Communication with Friends and Family:   . Frequency of Social Gatherings with Friends and Family:   . Attends Religious Services:   . Active Member of Clubs or Organizations:   . Attends Archivist Meetings:   Marland Kitchen Marital Status:   Intimate Partner Violence:   . Fear of Current or Ex-Partner:   . Emotionally Abused:   Marland Kitchen Physically Abused:   . Sexually Abused:     Review of Systems: See HPI, otherwise negative ROS  Physical Exam: BP 110/65   Pulse 96   Temp 99.1 F (37.3 C) (Temporal)   Resp 16   Ht '5\' 6"'  (1.676 m)   Wt 82.3 kg   SpO2 97%   BMI 29.27 kg/m  General:   Alert,  pleasant and cooperative in NAD Head:  Normocephalic and  atraumatic. Neck:  Supple; no masses or thyromegaly. Lungs:  Clear throughout to auscultation.    Heart:  Regular rate and rhythm. Abdomen:  Soft, nontender and nondistended. Normal bowel sounds, without guarding, and without rebound.   Neurologic:  Alert and  oriented x4;  grossly normal neurologically.  Impression/Plan: Natasha Dennis is here for an endoscopy to be performed for GERD and early satiety  Risks, benefits, limitations, and alternatives regarding  endoscopy have been reviewed with the patient.  Questions have been answered.  All parties agreeable.   Natasha Lame, MD  07/17/2019, 7:52 AM

## 2019-07-17 NOTE — Op Note (Addendum)
[facility] Gastroenterology Patient Name: Natasha Dennis Procedure Date: 07/17/2019 7:29 AM MRN: [id number] Account #: [id number] Date of Birth: 25-Jun-1965 Admit Type: Outpatient Age: 54 Room: [facility] ENDO ROOM 4 Gender: Female Note Status: Finalized Procedure:             Upper GI endoscopy Indications:           Heartburn, Early satiety Providers:             Lucilla Lame MD, MD Referring MD:          Lupita Raider. Malfi (Referring MD) Medicines:             Propofol per Anesthesia Complications:         No immediate complications. Procedure:             Pre-Anesthesia Assessment:                        - Prior to the procedure, a History and Physical was                         performed, and patient medications and allergies were                         reviewed. The patient's tolerance of previous                         anesthesia was also reviewed. The risks and benefits                         of the procedure and the sedation options and risks                         were discussed with the patient. All questions were                         answered, and informed consent was obtained. Prior                         Anticoagulants: The patient has taken no previous                         anticoagulant or antiplatelet agents. ASA Grade                         Assessment: II - A patient with mild systemic disease.                         After reviewing the risks and benefits, the patient                         was deemed in satisfactory condition to undergo the                         procedure.                        After obtaining informed consent, the endoscope was  passed under direct vision. Throughout the procedure,                         the patient's blood pressure, pulse, and oxygen                         saturations were monitored continuously. The Endoscope                         was introduced through the mouth, and  advanced to the                         second part of duodenum. The upper GI endoscopy was                         accomplished without difficulty. The patient tolerated                         the procedure well. Findings:      A small hiatal hernia was present.      Localized moderate inflammation characterized by erosions was found in       the gastric antrum. Biopsies were taken with a cold forceps for       histology.      Scattered moderate inflammation characterized by erythema was found in       the entire duodenum. Biopsies were taken with a cold forceps for       histology. Impression:            - Small hiatal hernia.                        - Gastritis. Biopsied.                        - Duodenitis. Biopsied. Recommendation:        - Discharge patient to home.                        - Resume previous diet.                        - Continue present medications.                        - Await pathology results.                        - Use Dexilant (dexlansoprazole) 60 mg PO daily. Procedure Code(s):     --- Professional ---                        [phone], Esophagogastroduodenoscopy, flexible,                         transoral; with biopsy, single or multiple Diagnosis Code(s):     --- Professional ---                        R68.81, Early satiety                        R12, Heartburn  K29.80, Duodenitis without bleeding                        K29.70, Gastritis, unspecified, without bleeding CPT copyright 2019 American Medical Association. All rights reserved. The codes documented in this report are preliminary and upon coder review may  be revised to meet current compliance requirements. Lucilla Lame MD, MD 07/17/2019 8:19:14 AM This report has been signed electronically. Number of Addenda: 0 Note Initiated On: 07/17/2019 7:29 AM Estimated Blood Loss:  Estimated blood loss: none.      [facility]

## 2019-07-21 ENCOUNTER — Telehealth: Payer: Self-pay | Admitting: Pharmacy Technician

## 2019-07-21 ENCOUNTER — Telehealth: Payer: Self-pay | Admitting: Internal Medicine

## 2019-07-21 ENCOUNTER — Telehealth: Payer: Self-pay | Admitting: Pharmacist

## 2019-07-21 DIAGNOSIS — C50912 Malignant neoplasm of unspecified site of left female breast: Secondary | ICD-10-CM

## 2019-07-21 LAB — SURGICAL PATHOLOGY

## 2019-07-21 MED ORDER — ABEMACICLIB 150 MG PO TABS
150.0000 mg | ORAL_TABLET | Freq: Two times a day (BID) | ORAL | 6 refills | Status: DC
Start: 2019-07-21 — End: 2019-07-22

## 2019-07-21 NOTE — Telephone Encounter (Signed)
Oral Oncology Patient Advocate Encounter   Received notification from [employer] that prior authorization for Verzenio is required.   PA submitted on CoverMyMeds Key [id number] Status is pending   Oral Oncology Clinic will continue to follow.  [place] Patient [place] Phone [phone] Fax [phone] 07/22/2019 9:16 AM

## 2019-07-21 NOTE — Telephone Encounter (Signed)
Oral Oncology Pharmacist Encounter  Received new prescription for Verzenio (abemaciclib) for the treatment of metastatic breast cancer in conjunction with fulvestrant, planned duration until disease progression or unacceptable drug toxicity.  CBC from 07/07/19 and CMP from 06/09/19 assessed, no relevant lab abnormalities. Prescription dose and frequency assessed.   Current medication list in Epic reviewed, no DDIs with abemaciclib identified.  Prescription has been e-scribed to the [facility] for benefits analysis and approval.  Oral Oncology Clinic will continue to follow for insurance authorization, copayment issues, initial counseling and start date.  Darl Pikes, PharmD, BCPS, BCOP, CPP Hematology/Oncology Clinical Pharmacist Practitioner [facility] [phone]  07/21/2019 2:33 PM

## 2019-07-21 NOTE — Telephone Encounter (Signed)
Error

## 2019-07-21 NOTE — Telephone Encounter (Signed)
On 5/17-spoke to patient regarding results of the endoscopy biopsy positive for malignancy; likely breast primary.  Recommendation of CDK-inhibitors; to her faslodex.   Judy/Alyson- please follow up. I sent the script for Verzinio to [facility].

## 2019-07-22 MED ORDER — ABEMACICLIB 150 MG PO TABS: 150.0000 mg | ORAL_TABLET | Freq: Two times a day (BID) | ORAL | 6 refills | Status: DC

## 2019-07-22 NOTE — Telephone Encounter (Signed)
Oral Oncology Patient Advocate Encounter  Prior Authorization for Melynda Keller has been approved.    PA# [id number] Effective dates: 07/22/19 through 07/20/20. Max 12 fills within effective dates.  Patients co-pay is $1923.00.  Will obtain a copay card to reduce patient's out of pocket cost.  Oral Oncology Clinic will continue to follow.   [place] Patient [place] Phone [phone] Fax [phone] 07/22/2019 4:19 PM

## 2019-07-23 ENCOUNTER — Encounter: Payer: Self-pay | Admitting: *Deleted

## 2019-07-23 ENCOUNTER — Telehealth: Payer: Self-pay | Admitting: Internal Medicine

## 2019-07-23 MED ORDER — ONDANSETRON HCL 8 MG PO TABS
ORAL_TABLET | ORAL | 1 refills | Status: AC
Start: 2019-07-23 — End: ?

## 2019-07-23 NOTE — Telephone Encounter (Signed)
On 5/20-spoke to patient regarding the plan to start on Verzenio.  Discussed the potential side effects including but not limited to-nausea vomiting diarrhea; low blood counts.  Recommend use of Zofran 30 to 60 minutes prior to taking the pill.  Discussed with patient regarding having Imodium at home for risk of diarrhea.   Patient understand that she can start the pill when available. Patient agreement.  Understands treatments are not curative but palliative.  Alyson- please reach out to the patient regarding- "chemo education".

## 2019-07-24 ENCOUNTER — Telehealth: Payer: Self-pay | Admitting: Pharmacy Technician

## 2019-07-24 NOTE — Telephone Encounter (Signed)
Oral Oncology Patient Advocate Encounter   Was successful in obtaining a copay card for Verzenio.  This copay card will make the patients copay $0.00.  I have spoken with the patient.    The billing information is as follows and has been shared with [place].   RxBin: [id number] PCN: [id number]  Member ID: [id number] Group ID: [id number]   Natasha Dennis Nancy [place] Patient [place] Phone [phone] Fax [phone] 07/24/2019 9:59 AM

## 2019-07-24 NOTE — Telephone Encounter (Signed)
Medication scheduled to be delivered 07/28/19 from [facility].

## 2019-07-24 NOTE — Telephone Encounter (Signed)
Oral Chemotherapy Pharmacist Encounter  Natasha Dennis will deliver medication on Tuesday 07/28/19. She knows to get started when she receives her medication.   Patient Education I spoke with patient for overview of new oral chemotherapy medication: Verzenio (abemaciclib) for the treatment of metastatic breast cancer in conjunction with fulvestrant, planned duration until disease progression or unacceptable drug toxicity.   Counseled patient on administration, dosing, side effects, monitoring, drug-food interactions, safe handling, storage, and disposal. Patient will take 1 tablet (150 mg total) by mouth every 12 (twelve) hours.  Side effects include but not limited to: diarrhea, N/V, fatigue, decreased wbc.    Patient will pick up some loperamide to have on hand.  Reviewed with patient importance of keeping a medication schedule and plan for any missed doses.  Natasha Dennis voiced understanding and appreciation. All questions answered. Medication handout placed in the mail.  Provided patient with Oral [facility] phone number. Patient knows to call the office with questions or concerns. Oral Chemotherapy Navigation Clinic will continue to follow.  Darl Pikes, PharmD, BCPS, BCOP, CPP Hematology/Oncology Clinical Pharmacist Practitioner [facility] [place] [phone]  07/24/2019 2:10 PM

## 2019-07-27 MED FILL — VERZENIO 150 MG TAB: 150 | 28 days supply | Qty: 56 | Fill #0

## 2019-07-28 NOTE — Progress Notes (Signed)
07/29/19 1:02 PM   [name] [birth year] [id number]  Referring provider: Cammie Sickle, MD [place],  [place] [address] Chief Complaint  Patient presents with  . Urinary Tract Infection    HPI: Natasha Dennis is a 54 y.o. F w/ hx of rUTIs presents today for the evaluation and management of UTI w/o hematuria.   Remote hx of breast cancer initially diagnosed in 2011 but was diagnosed w/ metastatic stage 4 reoccurence last month managed by Dr. Rogue Bussing.  This included bony metastatic disease as well as GI involvement.  Resumed Letrozole.   At her initial evaluation, she mentioned Dr. Provided that she was having some lower abdominal pressure as well as some flank pain.  She was checked and noted to have urinary tract infection, E. coli resistant to Cipro.  She complete the course her symptoms resolved but quickly returned.  She is wondering if her infection never completely went away.  Is been have another urinary tract infection with the same organism.  Cross sectional imaging in the form of CT A/P w Contrast in March 2021 negative for GU pathology including no stones. Scan did lead to advancement of breast cancer reoccurrence.   Today, she reports no symptoms including no dysuria or flank pain.  She does have some lower abdominal pressure which is chronic. She recently started Dexilant and reports of it being too early to assess symptom improvement.  She was told by GI that her GI tract was "inflamed".  She is been having loose stool on the medication that she is going to be starting also causes this is a side effect.  She does have a remote history of urinary tract infections as a young adult as well.  She does endorse vaginal dryness.  PVR 41 mL.   +Urine cultures 07/03/19 indicative of E. Coli resistant to ciprofloxacin  06/09/19 indicative of E. Coli resistant to ciprofloxacin   PMH: Past Medical History:  Diagnosis Date  . Breast cancer  ([place]) 03-17-2009   lt Mastectomy and chemo  . Cancer ([place])   . GERD (gastroesophageal reflux disease)   . History of bone marrow biopsy 06/03/2019  . Neoplastic malignant related fatigue     Surgical History: Past Surgical History:  Procedure Laterality Date  . ABDOMINAL HYSTERECTOMY    . AUGMENTATION MAMMAPLASTY Right    RT BREAST IMPLANT ONLY  . ESOPHAGOGASTRODUODENOSCOPY (EGD) WITH PROPOFOL N/A 07/17/2019   Procedure: ESOPHAGOGASTRODUODENOSCOPY (EGD) WITH PROPOFOL;  Surgeon: Lucilla Lame, MD;  Location: [facility] ENDOSCOPY;  Service: Endoscopy;  Laterality: N/A;  . MASTECTOMY Left 2011  . OOPHORECTOMY      Home Medications:  Allergies as of 07/29/2019      Reactions   Influenza Vaccines Swelling   Joint swelling, rash      Medication List       Accurate as of Jul 29, 2019  1:02 PM. If you have any questions, ask your nurse or doctor.        STOP taking these medications   sulfamethoxazole-trimethoprim 800-160 MG tablet Commonly known as: BACTRIM DS Stopped by: Hollice Espy, MD     TAKE these medications   abemaciclib 150 MG tablet Commonly known as: VERZENIO Take 1 tablet (150 mg total) by mouth every 12 (twelve) hours.   ALPRAZolam 1 MG tablet Commonly known as: XANAX Take 1 tablet (1 mg total) by mouth at bedtime as needed for anxiety.   cholecalciferol 1000 units tablet Commonly known as: VITAMIN D Take  1,000 Units by mouth daily.   Dexilant 60 MG capsule Generic drug: dexlansoprazole Take 1 capsule (60 mg total) by mouth daily. What changed: Another medication with the same name was removed. Continue taking this medication, and follow the directions you see here. Changed by: Ashley Brandon, MD   ergocalciferol 1.25 MG (50000 UT) capsule Commonly known as: VITAMIN D2 Take 1 capsule (50,000 Units total) by mouth once a week.   letrozole 2.5 MG tablet Commonly known as: FEMARA Take 1 tablet (2.5 mg total) by mouth daily. Once a day.   ondansetron 8 MG  tablet Commonly known as: ZOFRAN Take one pill 30-60 mins prior to taking Verzinio to prevent nausea.   OSTEO BI-FLEX ONE PER DAY PO Take by mouth.   PARoxetine 20 MG tablet Commonly known as: PAXIL Take 1 tablet (20 mg total) by mouth daily.       Allergies:  Allergies  Allergen Reactions  . Influenza Vaccines Swelling    Joint swelling, rash    Family History: Family History  Problem Relation Age of Onset  . Diabetes Mother   . Arthritis Mother   . Heart disease Father   . Breast cancer Neg Hx     Social History:  reports that she quit smoking about 35 years ago. She has never used smokeless tobacco. She reports that she does not drink alcohol or use drugs.   Physical Exam: BP 116/69   Pulse (!) 106   Ht 5' 6" (1.676 m)   Wt 183 lb (83 kg)   BMI 29.54 kg/m   Constitutional:  Alert and oriented, No acute distress. HEENT: [place] AT, moist mucus membranes.  Trachea midline, no masses. Cardiovascular: No clubbing, cyanosis, or edema. Respiratory: Normal respiratory effort, no increased work of breathing. Skin: No rashes, bruises or suspicious lesions. Neurologic: Grossly intact, no focal deficits, moving all 4 extremities. Psychiatric: Normal mood and affect.  Laboratory Data:  Lab Results  Component Value Date   CREATININE 0.67 06/23/2019   Urinalysis UA revealed pyuria and many bacteria.   Pertinent Imaging: Results for orders placed or performed in visit on 07/29/19  Bladder Scan (Post Void Residual) in office  Result Value Ref Range   Scan Result 41    Assessment & Plan:    1. rUTIs  Vs. Bacterial colonization   Asymptomatic 2 documented infections in short time interval. ???  Incompletely treated infections versus chronic colonization versus true recurrent urinary tract infections Suspect bacterial colonization as her previous symptoms are relatively nonspecific without dysuria (pelvic pressure possibly related to other pelvic organs and flank pain  possibly related to metastatic bony disease) Appears of urine today with the absence of symptoms is supportive of bacterial colonization Adequate emptying of bladder Counseled pt on prevention techniques including probiotics and cranberry tablets. Not a candidate for estrogen cream.  Vaginal lubricants as needed for vaginal dryness.  Hold off on suppressive antibiotics at this time but will do so if number of infections increases Return for cysto to rule out tumor or lesion in setting of metastatic breast cancer including bowel   2. Loose stool Possibly contributing factor to #1 Probiotic as above  Cysto as above  [facility] [address], [address] [place], [place] [address] [phone]  I, Nethusan Sivanesan, am acting as a scribe for Dr. Ashley Brandon,  I have reviewed the above documentation for accuracy and completeness, and I agree with the above.   Ashley Brandon, MD   I spent   45 total minutes on the day of the encounter including pre-visit review of the medical record, face-to-face time with the patient, and post visit ordering of labs/imaging/tests.

## 2019-07-29 ENCOUNTER — Encounter: Payer: Self-pay | Admitting: Urology

## 2019-07-29 ENCOUNTER — Ambulatory Visit (INDEPENDENT_AMBULATORY_CARE_PROVIDER_SITE_OTHER): Payer: No Typology Code available for payment source | Admitting: Urology

## 2019-07-29 ENCOUNTER — Other Ambulatory Visit: Payer: Self-pay

## 2019-07-29 VITALS — BP 116/69 | HR 106 | Ht 66.0 in | Wt 183.0 lb

## 2019-07-29 DIAGNOSIS — N390 Urinary tract infection, site not specified: Secondary | ICD-10-CM

## 2019-07-29 LAB — URINALYSIS, COMPLETE
Bilirubin, UA: NEGATIVE
Glucose, UA: NEGATIVE
Ketones, UA: NEGATIVE
Nitrite, UA: POSITIVE — AB
Protein,UA: NEGATIVE
Specific Gravity, UA: 1.01 (ref 1.005–1.030)
Urobilinogen, Ur: 0.2 mg/dL (ref 0.2–1.0)
pH, UA: 5.5 (ref 5.0–7.5)

## 2019-07-29 LAB — MICROSCOPIC EXAMINATION: WBC, UA: >30 /hpf — AB (ref 0–5)

## 2019-07-29 LAB — BLADDER SCAN AMB NON-IMAGING: Scan Result: 41

## 2019-07-29 NOTE — Patient Instructions (Signed)
Cystoscopy Cystoscopy is a procedure that is used to help diagnose and sometimes treat conditions that affect the lower urinary tract. The lower urinary tract includes the bladder and the urethra. The urethra is the tube that drains urine from the bladder. Cystoscopy is done using a thin, tube-shaped instrument with a light and camera at the end (cystoscope). The cystoscope may be hard or flexible, depending on the goal of the procedure. The cystoscope is inserted through the urethra, into the bladder. Cystoscopy may be recommended if you have:  Urinary tract infections that keep coming back.  Blood in the urine (hematuria).  An inability to control when you urinate (urinary incontinence) or an overactive bladder.  Unusual cells found in a urine sample.  A blockage in the urethra, such as a urinary stone.  Painful urination.  An abnormality in the bladder found during an intravenous pyelogram (IVP) or CT scan. Cystoscopy may also be done to remove a sample of tissue to be examined under a microscope (biopsy). What are the risks? Generally, this is a safe procedure. However, problems may occur, including:  Infection.  Bleeding.  What happens during the procedure?  1. You will be given one or more of the following: ? A medicine to numb the area (local anesthetic). 2. The area around the opening of your urethra will be cleaned. 3. The cystoscope will be passed through your urethra into your bladder. 4. Germ-free (sterile) fluid will flow through the cystoscope to fill your bladder. The fluid will stretch your bladder so that your health care provider can clearly examine your bladder walls. 5. Your doctor will look at the urethra and bladder. 6. The cystoscope will be removed The procedure may vary among health care providers  What can I expect after the procedure? After the procedure, it is common to have: 1. Some soreness or pain in your abdomen and urethra. 2. Urinary symptoms.  These include: ? Mild pain or burning when you urinate. Pain should stop within a few minutes after you urinate. This may last for up to 1 week. ? A small amount of blood in your urine for several days. ? Feeling like you need to urinate but producing only a small amount of urine. Follow these instructions at home: General instructions  Return to your normal activities as told by your health care provider.   Do not drive for 24 hours if you were given a sedative during your procedure.  Watch for any blood in your urine. If the amount of blood in your urine increases, call your health care provider.  If a tissue sample was removed for testing (biopsy) during your procedure, it is up to you to get your test results. Ask your health care provider, or the department that is doing the test, when your results will be ready.  Drink enough fluid to keep your urine pale yellow.  Keep all follow-up visits as told by your health care provider. This is important. Contact a health care provider if you:  Have pain that gets worse or does not get better with medicine, especially pain when you urinate.  Have trouble urinating.  Have more blood in your urine. Get help right away if you:  Have blood clots in your urine.  Have abdominal pain.  Have a fever or chills.  Are unable to urinate. Summary  Cystoscopy is a procedure that is used to help diagnose and sometimes treat conditions that affect the lower urinary tract.  Cystoscopy is done using   a thin, tube-shaped instrument with a light and camera at the end.  After the procedure, it is common to have some soreness or pain in your abdomen and urethra.  Watch for any blood in your urine. If the amount of blood in your urine increases, call your health care provider.  If you were prescribed an antibiotic medicine, take it as told by your health care provider. Do not stop taking the antibiotic even if you start to feel better. This  information is not intended to replace advice given to you by your health care provider. Make sure you discuss any questions you have with your health care provider. Document Revised: 02/11/2018 Document Reviewed: 02/11/2018 Elsevier Patient Education  2020 [employer]   

## 2019-08-03 LAB — CULTURE, URINE COMPREHENSIVE

## 2019-08-04 ENCOUNTER — Inpatient Hospital Stay: Payer: No Typology Code available for payment source | Attending: Internal Medicine

## 2019-08-04 ENCOUNTER — Inpatient Hospital Stay: Payer: No Typology Code available for payment source

## 2019-08-04 ENCOUNTER — Encounter: Payer: Self-pay | Admitting: Internal Medicine

## 2019-08-04 ENCOUNTER — Telehealth: Payer: Self-pay | Admitting: *Deleted

## 2019-08-04 ENCOUNTER — Other Ambulatory Visit: Payer: Self-pay

## 2019-08-04 ENCOUNTER — Inpatient Hospital Stay (HOSPITAL_BASED_OUTPATIENT_CLINIC_OR_DEPARTMENT_OTHER): Payer: No Typology Code available for payment source | Admitting: Internal Medicine

## 2019-08-04 DIAGNOSIS — Z5111 Encounter for antineoplastic chemotherapy: Secondary | ICD-10-CM | POA: Insufficient documentation

## 2019-08-04 DIAGNOSIS — Z79899 Other long term (current) drug therapy: Secondary | ICD-10-CM | POA: Insufficient documentation

## 2019-08-04 DIAGNOSIS — Z87440 Personal history of urinary (tract) infections: Secondary | ICD-10-CM | POA: Insufficient documentation

## 2019-08-04 DIAGNOSIS — C7952 Secondary malignant neoplasm of bone marrow: Secondary | ICD-10-CM | POA: Diagnosis not present

## 2019-08-04 DIAGNOSIS — C7951 Secondary malignant neoplasm of bone: Secondary | ICD-10-CM | POA: Diagnosis not present

## 2019-08-04 DIAGNOSIS — C50912 Malignant neoplasm of unspecified site of left female breast: Secondary | ICD-10-CM | POA: Insufficient documentation

## 2019-08-04 DIAGNOSIS — Z87891 Personal history of nicotine dependence: Secondary | ICD-10-CM | POA: Diagnosis not present

## 2019-08-04 DIAGNOSIS — Z9221 Personal history of antineoplastic chemotherapy: Secondary | ICD-10-CM | POA: Diagnosis not present

## 2019-08-04 DIAGNOSIS — Z7901 Long term (current) use of anticoagulants: Secondary | ICD-10-CM | POA: Diagnosis not present

## 2019-08-04 DIAGNOSIS — Z170 Estrogen receptor positive status [ER+]: Secondary | ICD-10-CM | POA: Insufficient documentation

## 2019-08-04 DIAGNOSIS — Z9012 Acquired absence of left breast and nipple: Secondary | ICD-10-CM | POA: Insufficient documentation

## 2019-08-04 DIAGNOSIS — K219 Gastro-esophageal reflux disease without esophagitis: Secondary | ICD-10-CM | POA: Insufficient documentation

## 2019-08-04 DIAGNOSIS — R630 Anorexia: Secondary | ICD-10-CM | POA: Diagnosis not present

## 2019-08-04 DIAGNOSIS — Z79811 Long term (current) use of aromatase inhibitors: Secondary | ICD-10-CM | POA: Insufficient documentation

## 2019-08-04 DIAGNOSIS — E871 Hypo-osmolality and hyponatremia: Secondary | ICD-10-CM | POA: Diagnosis not present

## 2019-08-04 DIAGNOSIS — Z923 Personal history of irradiation: Secondary | ICD-10-CM | POA: Insufficient documentation

## 2019-08-04 LAB — CBC WITH DIFFERENTIAL/PLATELET
Abs Immature Granulocytes: 0 10*3/uL (ref 0.00–0.07)
Basophils Absolute: 0.1 10*3/uL (ref 0.0–0.1)
Basophils Relative: 1 %
Eosinophils Absolute: 0.1 10*3/uL (ref 0.0–0.5)
Eosinophils Relative: 1 %
HCT: 34 % — ABNORMAL LOW (ref 36.0–46.0)
Hemoglobin: 10.3 g/dL — ABNORMAL LOW (ref 12.0–15.0)
Lymphocytes Relative: 22 %
Lymphs Abs: 2.7 10*3/uL (ref 0.7–4.0)
MCH: 27.8 pg (ref 26.0–34.0)
MCHC: 30.3 g/dL (ref 30.0–36.0)
MCV: 91.9 fL (ref 80.0–100.0)
Monocytes Absolute: 0.4 10*3/uL (ref 0.1–1.0)
Monocytes Relative: 3 %
Neutro Abs: 9 10*3/uL — ABNORMAL HIGH (ref 1.7–7.7)
Neutrophils Relative %: 73 %
Platelets: 358 10*3/uL (ref 150–400)
RBC: 3.7 MIL/uL — ABNORMAL LOW (ref 3.87–5.11)
RDW: 23.8 % — ABNORMAL HIGH (ref 11.5–15.5)
Smear Review: ADEQUATE
WBC: 12.3 10*3/uL — ABNORMAL HIGH (ref 4.0–10.5)
nRBC: 1.8 % — ABNORMAL HIGH (ref 0.0–0.2)

## 2019-08-04 LAB — COMPREHENSIVE METABOLIC PANEL
ALT: 18 U/L (ref 0–44)
AST: 40 U/L (ref 15–41)
Albumin: 2.9 g/dL — ABNORMAL LOW (ref 3.5–5.0)
Alkaline Phosphatase: 353 U/L — ABNORMAL HIGH (ref 38–126)
Anion gap: 8 (ref 5–15)
BUN: 15 mg/dL (ref 6–20)
CO2: 23 mmol/L (ref 22–32)
Calcium: 7.9 mg/dL — ABNORMAL LOW (ref 8.9–10.3)
Chloride: 109 mmol/L (ref 98–111)
Creatinine, Ser: 0.88 mg/dL (ref 0.44–1.00)
GFR calc Af Amer: >60 mL/min (ref >60)
GFR calc non Af Amer: >60 mL/min (ref >60)
Glucose, Bld: 107 mg/dL — ABNORMAL HIGH (ref 70–99)
Potassium: 4 mmol/L (ref 3.5–5.1)
Sodium: 140 mmol/L (ref 135–145)
Total Bilirubin: 0.6 mg/dL (ref 0.3–1.2)
Total Protein: 5.9 g/dL — ABNORMAL LOW (ref 6.5–8.1)

## 2019-08-04 MED ORDER — FULVESTRANT 250 MG/5ML IM SOLN
500.0000 mg | Freq: Once | INTRAMUSCULAR | Status: AC
  Administered 2019-08-04: 500 mg via INTRAMUSCULAR

## 2019-08-04 MED ORDER — DIPHENOXYLATE-ATROPINE 2.5-0.025 MG PO TABS
1.0000 | ORAL_TABLET | Freq: Four times a day (QID) | ORAL | 0 refills | Status: DC | PRN
Start: 2019-08-04 — End: 2019-08-31

## 2019-08-04 MED ORDER — SULFAMETHOXAZOLE-TRIMETHOPRIM 800-160 MG PO TABS
1.0000 | ORAL_TABLET | Freq: Two times a day (BID) | ORAL | 0 refills | Status: DC
Start: 2019-08-04 — End: 2019-08-18

## 2019-08-04 NOTE — Telephone Encounter (Addendum)
Patient informed, RX sent to [facility] as requested-aware of instructions.   ----- Message from Hollice Espy, MD sent at 08/04/2019  8:47 AM EDT ----- In light of this patient having a cystoscopy tomorrow, would like her to start Bactrim DS bid today for total of a 3-day course even though she is minimally symptomatic to clear her urine somewhat and improve visualization.  Hollice Espy, MD

## 2019-08-04 NOTE — Assessment & Plan Note (Addendum)
#  Metastatic breast carcinoma-involving the bone marrow/bones; GI/ [s/p EGD] abdominal involvement [see below];  Currently on Faslodex; Started Abema 150 mg BID appx 1 week ago.  Tolerating with mild to moderate difficulties./See below  # GI upset- Diarrhea/nausea/vomitting- from Abema- continue imoidum/zofran; add lomotil.  Recommend evaluation with nutrition.  #Recurrent UTIs-s/p Bactrim-symptoms improving; awaiting cystoscopy on 6/2;   #Hypocalcemia -likely secondary hypocalcemia/Zometa; hold Zometa today.  Continue vitamin D 50,000 units weekly; recommend  calcium twice a day.  # Bone metastases-on Zometa 3 mg; monitor closely for hypercalcemia/side effects.  Hold Zometa/above  # DISPOSITION: # referral to [facility] re: weight loss/ diarrhea # Faslodex today; HOLD ZOMETA.  # follow up in 2 week-MD;labs- [facility]/cmp/Dr.B

## 2019-08-04 NOTE — Progress Notes (Signed)
[place] CONSULT NOTE  Patient Care Team: Malfi, Lupita Raider, FNP as PCP - General (Family Medicine) Cammie Sickle, MD as Consulting Physician (Internal Medicine) Lucilla Lame, MD as Consulting Physician (Gastroenterology) Hollice Espy, MD as Consulting Physician (Urology)  CHIEF COMPLAINTS/PURPOSE OF CONSULTATION: Thrombocytopenia/anemia  Oncology History Overview Note  #  May 2010-stage II lobular cancer left breast; [Dr.Choksi] on a protocol received neoadjuvant chemotherapy s/p mastectomy reconstruction/TRAM; right breast augmentation.  Initially tamoxifen until July 2012-then Femara 2.5 mg; stopped because of joint pains; July 2013-Aromasin again stopped because of joint pains [total of 4 years-endocrine therapy].  #Bilateral salpingo-oophorectomy.   #March/April 2021-thrombocytopenia/worsening anemia-bone marrow biopsy-"carcinoma".  ER/PR status HER-2/neu status-NA; March 2021 abdomen pelvis CT scan-vague infiltrative lesion noted duodenal area; June 08, 2019-PET scan left axilla adenopathy; multiple bone lesions.   #April 2021-left axial lymph node biopsy; positive for breast carcinoma; NGS pending.    # 20th April 2021-Faslodex  # NGS/MOLECULAR TESTS:    # PALLIATIVE CARE EVALUATION:  # PAIN MANAGEMENT:    DIAGNOSIS:   STAGE:         ;  GOALS:  CURRENT/MOST RECENT THERAPY :     Primary cancer of left breast with metastasis to other site [facility])  07/05/2008 Initial Diagnosis   History is obtained through chart review from Dr. Metro Kung notes. She was originally diagnosed with breast cancer, invasive lobular carcinoma in May 2010. She was placed on the protocol and received neoadjuvant chemotherapy. She had left mastectomy with reconstruction surgery and right breast augmentation with implant. Subsequently she was placed on tamoxifen until July of 2012, then Femara 2.5 mg.  Femara was stopped because of bony pains July of 2013. She was then switched  to Aromasin. Patient has stopped taking Aromasin because of perceived side effect. In total, she had received anti-estrogen therapy for almost 4 years   10/17/2010 Surgery   She had bilateral salpingo-oophorectomy   01/21/2016 Imaging   No MRI evidence of malignancy in the right breast or reconstructed left breast (TRAM flap).     HISTORY OF PRESENTING ILLNESS:  Natasha Dennis 54 y.o.  female metastatic lobular cancer involving bone marrow; bone involvement-currently on Faslodex is here for follow-up.  In the interim patient was evaluated by GI underwent upper endoscopy; biopsy positive for malignancy.  Patient was recently evaluated by urology for multiple UTIs.  Awaiting cystoscopy tomorrow.  Continues to complain of nausea; one episode of vomiting today.  Notes to have diarrhea up to 3-4 loose stools a day-since start taking abemaciclib.  Patient started taking abemaciclib approximately a week ago.  Review of Systems  Constitutional: Positive for malaise/fatigue. Negative for chills, diaphoresis, fever and weight loss.  HENT: Negative for nosebleeds and sore throat.   Eyes: Negative for double vision.  Respiratory: Negative for cough, hemoptysis, sputum production, shortness of breath and wheezing.   Cardiovascular: Negative for chest pain, palpitations, orthopnea and leg swelling.  Gastrointestinal: Positive for abdominal pain. Negative for blood in stool, constipation, diarrhea, heartburn, melena, nausea and vomiting.  Genitourinary: Negative for dysuria, frequency and urgency.  Musculoskeletal: Negative for back pain and joint pain.  Skin: Negative.  Negative for itching and rash.  Neurological: Negative for dizziness, tingling, focal weakness, weakness and headaches.  Endo/Heme/Allergies: Does not bruise/bleed easily.  Psychiatric/Behavioral: Negative for depression. The patient is not nervous/anxious and does not have insomnia.      MEDICAL HISTORY:  Past Medical  History:  Diagnosis Date  . Breast cancer ([place]) 03-17-2009  lt Mastectomy and chemo  . Cancer ([place])   . GERD (gastroesophageal reflux disease)   . History of bone marrow biopsy 06/03/2019  . Neoplastic malignant related fatigue     SURGICAL HISTORY: Past Surgical History:  Procedure Laterality Date  . ABDOMINAL HYSTERECTOMY    . AUGMENTATION MAMMAPLASTY Right    RT BREAST IMPLANT ONLY  . ESOPHAGOGASTRODUODENOSCOPY (EGD) WITH PROPOFOL N/A 07/17/2019   Procedure: ESOPHAGOGASTRODUODENOSCOPY (EGD) WITH PROPOFOL;  Surgeon: Lucilla Lame, MD;  Location[facility] ENDOSCOPY;  Service: Endoscopy;  Laterality: N/A;  . MASTECTOMY Left 2011  . OOPHORECTOMY      SOCIAL HISTORY: Social History   Socioeconomic History  . Marital status: Married    Spouse name: Not on file  . Number of children: Not on file  . Years of education: Not on file  . Highest education level: Not on file  Occupational History  . Not on file  Tobacco Use  . Smoking status: Former Smoker    Quit date: 03/05/1984    Years since quitting: 35.4  . Smokeless tobacco: Never Used  Substance and Sexual Activity  . Alcohol use: No  . Drug use: No  . Sexual activity: Not on file  Other Topics Concern  . Not on file  Social History Narrative  . Not on file   Social Determinants of Health   Financial Resource Strain:   . Difficulty of Paying Living Expenses:   Food Insecurity:   . Worried About Charity fundraiser in the Last Year:   . Arboriculturist in the Last Year:   Transportation Needs:   . Film/video editor (Medical):   Marland Kitchen Lack of Transportation (Non-Medical):   Physical Activity:   . Days of Exercise per Week:   . Minutes of Exercise per Session:   Stress:   . Feeling of Stress :   Social Connections:   . Frequency of Communication with Friends and Family:   . Frequency of Social Gatherings with Friends and Family:   . Attends Religious Services:   . Active Member of Clubs or Organizations:   .  Attends Archivist Meetings:   Marland Kitchen Marital Status:   Intimate Partner Violence:   . Fear of Current or Ex-Partner:   . Emotionally Abused:   Marland Kitchen Physically Abused:   . Sexually Abused:     FAMILY HISTORY: Family History  Problem Relation Age of Onset  . Diabetes Mother   . Arthritis Mother   . Heart disease Father   . Breast cancer Neg Hx     ALLERGIES:  is allergic to influenza vaccines.  MEDICATIONS:  Current Outpatient Medications  Medication Sig Dispense Refill  . abemaciclib (VERZENIO) 150 MG tablet Take 1 tablet (150 mg total) by mouth every 12 (twelve) hours. 56 tablet 6  . ALPRAZolam (XANAX) 1 MG tablet Take 1 tablet (1 mg total) by mouth at bedtime as needed for anxiety. 30 tablet 0  . [place]-Glucosamine-Vit D (OSTEO BI-FLEX ONE PER DAY PO) Take by mouth.    . cholecalciferol (VITAMIN D) 1000 units tablet Take 1,000 Units by mouth daily.    Marland Kitchen dexlansoprazole (DEXILANT) 60 MG capsule Take 1 capsule (60 mg total) by mouth daily. 30 capsule 11  . diphenoxylate-atropine (LOMOTIL) 2.5-0.025 MG tablet Take 1 tablet by mouth 4 (four) times daily as needed for diarrhea or loose stools. Take it along with immodium 30 tablet 0  . ergocalciferol (VITAMIN D2) 1.25 MG (50000 UT) capsule Take 1 capsule (  50,000 Units total) by mouth once a week. 12 capsule 1  . letrozole (FEMARA) 2.5 MG tablet Take 1 tablet (2.5 mg total) by mouth daily. Once a day. (Patient not taking: Reported on 07/29/2019) 30 tablet 0  . ondansetron (ZOFRAN) 8 MG tablet Take one pill 30-60 mins prior to taking Verzinio to prevent nausea. 40 tablet 1  . PARoxetine (PAXIL) 20 MG tablet Take 1 tablet (20 mg total) by mouth daily. 90 tablet 1   No current facility-administered medications for this visit.   Facility-Administered Medications Ordered in Other Visits  Medication Dose Route Frequency Provider Last Rate Last Admin  . fulvestrant (FASLODEX) injection 500 mg  500 mg Intramuscular Once Charlaine Dalton R, MD         .  PHYSICAL EXAMINATION:   Vitals:   08/04/19 1321  BP: 123/72  Pulse: (!) 101  Temp: 98.7 F (37.1 C)   Filed Weights   08/04/19 1321  Weight: 176 lb 6 oz (80 kg)    Physical Exam  Constitutional: She is oriented to person, place, and time and well-developed, well-nourished, and in no distress.  HENT:  Head: Normocephalic and atraumatic.  Mouth/Throat: Oropharynx is clear and moist. No oropharyngeal exudate.  Eyes: Pupils are equal, round, and reactive to light.  Cardiovascular: Normal rate and regular rhythm.  Pulmonary/Chest: Effort normal and breath sounds normal. No respiratory distress. She has no wheezes.  Abdominal: Soft. Bowel sounds are normal. She exhibits no distension and no mass. There is no abdominal tenderness. There is no rebound and no guarding.  Abdominal discomfort on deep palpation  Musculoskeletal:        General: No tenderness or edema. Normal range of motion.     Cervical back: Normal range of motion and neck supple.  Neurological: She is alert and oriented to person, place, and time.  Skin: Skin is warm.      Psychiatric: Affect normal.     LABORATORY DATA:  I have reviewed the data as listed Lab Results  Component Value Date   WBC 12.3 (H) 08/04/2019   HGB 10.3 (L) 08/04/2019   HCT 34.0 (L) 08/04/2019   MCV 91.9 08/04/2019   PLT 358 08/04/2019   Recent Labs    05/27/19 1435 05/27/19 1435 06/09/19 1114 06/23/19 1030 08/04/19 1308  NA 138   < > 138 137 140  K 3.9   < > 3.7 4.0 4.0  CL 107   < > 108 105 109  CO2 24   < > _0 GLUCOSE 95   < > 95 102* 107*  BUN 14   < > _1 CREATININE 0.81   < > 0.73 0.67 0.88  CALCIUM 8.8*   < > 8.5* 8.5* 7.9*  GFRNONAA >60   < > >60 >60 >60  GFRAA >60   < > >60 >60 >60  PROT 6.5  --  6.6  --  5.9*  ALBUMIN 3.9  --  3.8  --  2.9*  AST 46*  --  42*  --  40  ALT 38  --  25  --  18  ALKPHOS 309*  --  334*  --  353*  BILITOT 1.1  --  1.7*  --  0.6   < > =  values in this interval not displayed.     No results found.  ASSESSMENT & PLAN:   Primary cancer of left breast with metastasis to other site [facility]) #Metastatic breast  carcinoma-involving the bone marrow/bones; GI/ [s/p EGD] abdominal involvement [see below];  Currently on Faslodex; Started Abema 150 mg BID appx 1 week ago.  Tolerating with mild to moderate difficulties./See below  # GI upset- Diarrhea/nausea/vomitting- from Abema- continue imoidum/zofran; add lomotil.  Recommend evaluation with nutrition.  #Recurrent UTIs-s/p Bactrim-symptoms improving; awaiting cystoscopy on 6/2;   #Hypocalcemia -likely secondary hypocalcemia/Zometa; hold Zometa today.  Continue vitamin D 50,000 units weekly; recommend  calcium twice a day.  # Bone metastases-on Zometa 3 mg; monitor closely for hypercalcemia/side effects.  Hold Zometa/above  # DISPOSITION: # referral to [facility] re: weight loss/ diarrhea # Faslodex today; HOLD ZOMETA.  # follow up in 2 week-MD;labs- [facility]bc/cmp/Dr.B     Cammie Sickle, MD 08/04/2019 2:33 PM

## 2019-08-04 NOTE — Progress Notes (Signed)
° °  08/05/19  CC:  Chief Complaint  Patient presents with   Cysto    HPI: Natasha Dennis is a 54 y.o. F w/ hx of rUTIs returns today for cystoscopy.   Remote hx of breast cancer initially diagnosed in 2011 but was diagnosed w/ metastatic stage 4 reoccurence last month managed by Dr. Rogue Bussing.  This included bony metastatic disease as well as GI involvement.  Resumed Letrozole.   At her initial evaluation, she mentioned Dr. Provided that she was having some lower abdominal pressure as well as some flank pain.  She was checked and noted to have urinary tract infection, E. coli resistant to Cipro.  She complete the course her symptoms resolved but quickly returned.  She is wondering if her infection never completely went away.  Is been have another urinary tract infection with the same organism.  Cross sectional imaging in the form of CT A/P w Contrast in March 2021 negative for GU pathology including no stones. Scan did lead to advancement of breast cancer reoccurrence.   She does have a remote history of urinary tract infections as a young adult as well.  +Urine cultures 07/03/19 indicative of E. Coli resistant to ciprofloxacin  06/09/19 indicative of E. Coli resistant to ciprofloxacin  07/29/19 indicative of E. Coli resistant to ciprofloxacin and levofloxacin   At the time of her last visit, she was relatively asymptomatic but she does report over the week, she started to have some burning urgency and frequency.  She was started on Bactrim yesterday.  Today's Vitals   08/05/19 1121  BP: 132/78  Pulse: 98   There is no height or weight on file to calculate BMI. NED. A&Ox3.   No respiratory distress   Abd soft, NT, ND Normal external genitalia with patent urethral meatus  Cystoscopy Procedure Note  Patient identification was confirmed, informed consent was obtained, and patient was prepped using Betadine solution.  Lidocaine jelly was administered per urethral meatus.     Procedure: - Flexible cystoscope introduced, without any difficulty.   - Thorough search of the bladder revealed:    normal urethral meatus    normal urothelium    no stones    no ulcers     no tumors    no urethral polyps    no trabeculation    inflammation at trigone w/ whitish papules consistent w/ cystitis cystica and inflammatory changes noted at dome  also consistent w/ cystitis cystica   - Ureteral orifices were normal in position and appearance.  Post-Procedure: - Patient tolerated the procedure well  Assessment/ Plan:  1. Cystitis cystica  3 documented infections in short time interval Cysto today revealed findings consistent w/ cystitis cystica without concern for malignancy Will treat w/ abx x 7 day course (extend course started yesterday in light of new symptoms); Rx of bactrim sent to pharmacy  Will start suppressive antibiotics thereafter x 90 days; Rx of trimethoprim sent to pharmacy  Return in 3 months or sooner if UTI symptoms present     I, Lucas Mallow, am acting as a scribe for Dr. Hollice Espy,  I have reviewed the above documentation for accuracy and completeness, and I agree with the above.   Hollice Espy, MD

## 2019-08-05 ENCOUNTER — Encounter: Payer: Self-pay | Admitting: Urology

## 2019-08-05 ENCOUNTER — Ambulatory Visit (INDEPENDENT_AMBULATORY_CARE_PROVIDER_SITE_OTHER): Payer: No Typology Code available for payment source | Admitting: Urology

## 2019-08-05 VITALS — BP 132/78 | HR 98

## 2019-08-05 DIAGNOSIS — N390 Urinary tract infection, site not specified: Secondary | ICD-10-CM

## 2019-08-05 MED ORDER — SULFAMETHOXAZOLE-TRIMETHOPRIM 800-160 MG PO TABS
1.0000 | ORAL_TABLET | Freq: Two times a day (BID) | ORAL | 0 refills | Status: DC
Start: 2019-08-05 — End: 2019-08-18

## 2019-08-05 MED ORDER — TRIMETHOPRIM 100 MG PO TABS: 100.0000 mg | ORAL_TABLET | Freq: Every day | ORAL | 0 refills | Status: AC

## 2019-08-06 ENCOUNTER — Telehealth: Payer: Self-pay | Admitting: *Deleted

## 2019-08-06 NOTE — Telephone Encounter (Signed)
Received incoming fax from [facility] for disability. This form was previously faxed on behalf of the patient. I refaxed the completed forms again to [employer].

## 2019-08-10 ENCOUNTER — Encounter: Payer: Self-pay | Admitting: Internal Medicine

## 2019-08-10 ENCOUNTER — Other Ambulatory Visit: Payer: Self-pay | Admitting: *Deleted

## 2019-08-10 MED ORDER — PROCHLORPERAZINE MALEATE 10 MG PO TABS: 10.0000 mg | ORAL_TABLET | Freq: Four times a day (QID) | ORAL | 3 refills | Status: AC | PRN

## 2019-08-17 ENCOUNTER — Telehealth: Payer: Self-pay | Admitting: *Deleted

## 2019-08-17 ENCOUNTER — Other Ambulatory Visit: Payer: Self-pay | Admitting: *Deleted

## 2019-08-17 DIAGNOSIS — R197 Diarrhea, unspecified: Secondary | ICD-10-CM

## 2019-08-17 DIAGNOSIS — C50912 Malignant neoplasm of unspecified site of left female breast: Secondary | ICD-10-CM

## 2019-08-17 DIAGNOSIS — R112 Nausea with vomiting, unspecified: Secondary | ICD-10-CM

## 2019-08-17 NOTE — Telephone Encounter (Signed)
On 6/14-spoke to patient regarding her ongoing diarrhea nausea; feeling poorly.  Recommend stopping Verzenio-likely cause of symptoms.  Recommend follow-up in the cancer center-on June 15; 10:30 labs; followed by appointment with [facility].  Reviewed the timing for the patient; verbalized understanding.

## 2019-08-17 NOTE — Telephone Encounter (Signed)
Husband called reporting that patient has had vomiting and diarrhea for several days and that she is seeing black spots in front of her eyes like she is going to pass out. He is asking that she come in to be seen tomorrow and get some IV fluids as well. He states that if she gets worse tonight that he will take her to the ER. Please advise

## 2019-08-18 ENCOUNTER — Inpatient Hospital Stay: Payer: No Typology Code available for payment source

## 2019-08-18 ENCOUNTER — Inpatient Hospital Stay (HOSPITAL_BASED_OUTPATIENT_CLINIC_OR_DEPARTMENT_OTHER): Payer: No Typology Code available for payment source | Admitting: Hospice and Palliative Medicine

## 2019-08-18 ENCOUNTER — Encounter: Payer: Self-pay | Admitting: Medical Oncology

## 2019-08-18 ENCOUNTER — Emergency Department: Payer: No Typology Code available for payment source

## 2019-08-18 ENCOUNTER — Encounter: Payer: Self-pay | Admitting: Hospice and Palliative Medicine

## 2019-08-18 ENCOUNTER — Other Ambulatory Visit: Payer: Self-pay

## 2019-08-18 ENCOUNTER — Inpatient Hospital Stay
Admission: AD | Admit: 2019-08-18 | Discharge: 2019-08-21 | DRG: 189 | Disposition: A | Discharge status: Home Health | Payer: No Typology Code available for payment source | Attending: Internal Medicine | Admitting: Internal Medicine

## 2019-08-18 VITALS — BP 117/67 | HR 112 | Temp 98.5°F | Resp 20 | Wt 176.0 lb

## 2019-08-18 DIAGNOSIS — J90 Pleural effusion, not elsewhere classified: Secondary | ICD-10-CM

## 2019-08-18 DIAGNOSIS — I482 Chronic atrial fibrillation, unspecified: Secondary | ICD-10-CM | POA: Diagnosis not present

## 2019-08-18 DIAGNOSIS — R7989 Other specified abnormal findings of blood chemistry: Secondary | ICD-10-CM | POA: Diagnosis not present

## 2019-08-18 DIAGNOSIS — E860 Dehydration: Secondary | ICD-10-CM | POA: Diagnosis not present

## 2019-08-18 DIAGNOSIS — I4891 Unspecified atrial fibrillation: Secondary | ICD-10-CM | POA: Diagnosis not present

## 2019-08-18 DIAGNOSIS — C50919 Malignant neoplasm of unspecified site of unspecified female breast: Secondary | ICD-10-CM | POA: Diagnosis not present

## 2019-08-18 DIAGNOSIS — R530 Neoplastic (malignant) related fatigue: Secondary | ICD-10-CM | POA: Diagnosis present

## 2019-08-18 DIAGNOSIS — R112 Nausea with vomiting, unspecified: Secondary | ICD-10-CM

## 2019-08-18 DIAGNOSIS — E876 Hypokalemia: Secondary | ICD-10-CM

## 2019-08-18 DIAGNOSIS — C7952 Secondary malignant neoplasm of bone marrow: Secondary | ICD-10-CM | POA: Diagnosis present

## 2019-08-18 DIAGNOSIS — R0602 Shortness of breath: Secondary | ICD-10-CM | POA: Diagnosis present

## 2019-08-18 DIAGNOSIS — E871 Hypo-osmolality and hyponatremia: Secondary | ICD-10-CM | POA: Diagnosis present

## 2019-08-18 DIAGNOSIS — Z20822 Contact with and (suspected) exposure to covid-19: Secondary | ICD-10-CM | POA: Diagnosis present

## 2019-08-18 DIAGNOSIS — K219 Gastro-esophageal reflux disease without esophagitis: Secondary | ICD-10-CM | POA: Diagnosis not present

## 2019-08-18 DIAGNOSIS — J910 Malignant pleural effusion: Secondary | ICD-10-CM | POA: Diagnosis present

## 2019-08-18 DIAGNOSIS — R188 Other ascites: Secondary | ICD-10-CM | POA: Diagnosis present

## 2019-08-18 DIAGNOSIS — D6959 Other secondary thrombocytopenia: Secondary | ICD-10-CM | POA: Diagnosis present

## 2019-08-18 DIAGNOSIS — R197 Diarrhea, unspecified: Secondary | ICD-10-CM

## 2019-08-18 DIAGNOSIS — Z87891 Personal history of nicotine dependence: Secondary | ICD-10-CM

## 2019-08-18 DIAGNOSIS — Z6833 Body mass index (BMI) 33.0-33.9, adult: Secondary | ICD-10-CM

## 2019-08-18 DIAGNOSIS — M19011 Primary osteoarthritis, right shoulder: Secondary | ICD-10-CM | POA: Diagnosis present

## 2019-08-18 DIAGNOSIS — C7951 Secondary malignant neoplasm of bone: Secondary | ICD-10-CM | POA: Diagnosis present

## 2019-08-18 DIAGNOSIS — C50912 Malignant neoplasm of unspecified site of left female breast: Secondary | ICD-10-CM | POA: Diagnosis present

## 2019-08-18 DIAGNOSIS — I4892 Unspecified atrial flutter: Secondary | ICD-10-CM | POA: Diagnosis not present

## 2019-08-18 DIAGNOSIS — Z833 Family history of diabetes mellitus: Secondary | ICD-10-CM

## 2019-08-18 DIAGNOSIS — Z9012 Acquired absence of left breast and nipple: Secondary | ICD-10-CM | POA: Diagnosis not present

## 2019-08-18 DIAGNOSIS — D6481 Anemia due to antineoplastic chemotherapy: Secondary | ICD-10-CM | POA: Diagnosis not present

## 2019-08-18 DIAGNOSIS — Z8249 Family history of ischemic heart disease and other diseases of the circulatory system: Secondary | ICD-10-CM

## 2019-08-18 DIAGNOSIS — Z66 Do not resuscitate: Secondary | ICD-10-CM | POA: Diagnosis present

## 2019-08-18 DIAGNOSIS — N179 Acute kidney failure, unspecified: Secondary | ICD-10-CM | POA: Diagnosis not present

## 2019-08-18 DIAGNOSIS — Z8261 Family history of arthritis: Secondary | ICD-10-CM | POA: Diagnosis not present

## 2019-08-18 DIAGNOSIS — E43 Unspecified severe protein-calorie malnutrition: Secondary | ICD-10-CM | POA: Diagnosis present

## 2019-08-18 DIAGNOSIS — N178 Other acute kidney failure: Secondary | ICD-10-CM | POA: Diagnosis not present

## 2019-08-18 DIAGNOSIS — T451X5A Adverse effect of antineoplastic and immunosuppressive drugs, initial encounter: Secondary | ICD-10-CM | POA: Diagnosis present

## 2019-08-18 DIAGNOSIS — D696 Thrombocytopenia, unspecified: Secondary | ICD-10-CM

## 2019-08-18 DIAGNOSIS — C7989 Secondary malignant neoplasm of other specified sites: Secondary | ICD-10-CM | POA: Diagnosis not present

## 2019-08-18 DIAGNOSIS — Z170 Estrogen receptor positive status [ER+]: Secondary | ICD-10-CM

## 2019-08-18 DIAGNOSIS — J9601 Acute respiratory failure with hypoxia: Principal | ICD-10-CM | POA: Diagnosis present

## 2019-08-18 DIAGNOSIS — Z9889 Other specified postprocedural states: Secondary | ICD-10-CM

## 2019-08-18 LAB — COMPREHENSIVE METABOLIC PANEL
ALT: 38 U/L (ref 0–44)
AST: 88 U/L — ABNORMAL HIGH (ref 15–41)
Albumin: 2.6 g/dL — ABNORMAL LOW (ref 3.5–5.0)
Alkaline Phosphatase: 359 U/L — ABNORMAL HIGH (ref 38–126)
Anion gap: 11 (ref 5–15)
BUN: 25 mg/dL — ABNORMAL HIGH (ref 6–20)
CO2: 20 mmol/L — ABNORMAL LOW (ref 22–32)
Calcium: 7.3 mg/dL — ABNORMAL LOW (ref 8.9–10.3)
Chloride: 100 mmol/L (ref 98–111)
Creatinine, Ser: 1.53 mg/dL — ABNORMAL HIGH (ref 0.44–1.00)
GFR calc Af Amer: 44 mL/min — ABNORMAL LOW (ref >60)
GFR calc non Af Amer: 38 mL/min — ABNORMAL LOW (ref >60)
Glucose, Bld: 120 mg/dL — ABNORMAL HIGH (ref 70–99)
Potassium: 2.8 mmol/L — ABNORMAL LOW (ref 3.5–5.1)
Sodium: 131 mmol/L — ABNORMAL LOW (ref 135–145)
Total Bilirubin: 0.8 mg/dL (ref 0.3–1.2)
Total Protein: 5.9 g/dL — ABNORMAL LOW (ref 6.5–8.1)

## 2019-08-18 LAB — CBC WITH DIFFERENTIAL/PLATELET
Abs Immature Granulocytes: 0.13 10*3/uL — ABNORMAL HIGH (ref 0.00–0.07)
Basophils Absolute: 0.1 10*3/uL (ref 0.0–0.1)
Basophils Relative: 1 %
Eosinophils Absolute: 0 10*3/uL (ref 0.0–0.5)
Eosinophils Relative: 0 %
HCT: 28.6 % — ABNORMAL LOW (ref 36.0–46.0)
Hemoglobin: 8.9 g/dL — ABNORMAL LOW (ref 12.0–15.0)
Immature Granulocytes: 1 %
Lymphocytes Relative: 51 %
Lymphs Abs: 5.2 10*3/uL — ABNORMAL HIGH (ref 0.7–4.0)
MCH: 27.1 pg (ref 26.0–34.0)
MCHC: 31.1 g/dL (ref 30.0–36.0)
MCV: 87.2 fL (ref 80.0–100.0)
Monocytes Absolute: 0.7 10*3/uL (ref 0.1–1.0)
Monocytes Relative: 7 %
Neutro Abs: 4.1 10*3/uL (ref 1.7–7.7)
Neutrophils Relative %: 40 %
Platelets: 131 10*3/uL — ABNORMAL LOW (ref 150–400)
RBC: 3.28 MIL/uL — ABNORMAL LOW (ref 3.87–5.11)
RDW: 24.5 % — ABNORMAL HIGH (ref 11.5–15.5)
WBC: 10.2 10*3/uL (ref 4.0–10.5)
nRBC: 1 % — ABNORMAL HIGH (ref 0.0–0.2)

## 2019-08-18 LAB — TROPONIN I (HIGH SENSITIVITY)
Troponin I (High Sensitivity): 16 ng/L (ref <18)
Troponin I (High Sensitivity): 14 ng/L (ref <18)

## 2019-08-18 LAB — SARS CORONAVIRUS 2 BY RT PCR (HOSPITAL ORDER, PERFORMED IN ~~LOC~~ HOSPITAL LAB): SARS Coronavirus 2: NEGATIVE

## 2019-08-18 LAB — MAGNESIUM: Magnesium: 2.1 mg/dL (ref 1.7–2.4)

## 2019-08-18 LAB — BRAIN NATRIURETIC PEPTIDE: B Natriuretic Peptide: 268.7 pg/mL — ABNORMAL HIGH (ref 0.0–100.0)

## 2019-08-18 MED ORDER — SODIUM CHLORIDE 0.9 % IV SOLN
40.0000 meq | Freq: Once | INTRAVENOUS | Status: AC
  Administered 2019-08-18: 40 meq via INTRAVENOUS
  Filled 2019-08-18: qty 20

## 2019-08-18 MED ORDER — IOHEXOL 350 MG/ML SOLN
60.0000 mL | Freq: Once | INTRAVENOUS | Status: AC | PRN
  Administered 2019-08-18: 60 mL via INTRAVENOUS

## 2019-08-18 MED ORDER — FUROSEMIDE 10 MG/ML IJ SOLN
40.0000 mg | Freq: Once | INTRAMUSCULAR | Status: AC
  Administered 2019-08-18: 40 mg via INTRAVENOUS
  Filled 2019-08-18: qty 4

## 2019-08-18 NOTE — ED Provider Notes (Signed)
[facility] Emergency Department Provider Note  ____________________________________________   First MD Initiated Contact with Patient 08/18/19 1800     (approximate)  I have reviewed the triage vital signs and the nursing notes.   HISTORY  Chief Complaint Shortness of Breath    HPI Natasha Dennis is a 54 y.o. female with breast cancer with mets to the bone currently on chemotherapy comes in with shortness of breath.  Patient was seen by Dr. Regenia Skeeter and oncology patient was having nausea, vomiting, diarrhea in the setting of abemaciclib.  Patient was noted to be hyponatremic to 131 and given 1 L of normal saline.  She was also noted to be hypokalemic to 2.8 and given 40 of IV K.  Her kidney function was elevated to 1.53 up from baseline of 0.8.  Patient was also found to be hypoxic into the 80s upon arrival.  She was placed on 2 L.  Patient was sent to the ER for CT scan to rule out PE.  Patient states that she is had some exertional shortness of breath for the past few weeks, intermittent, worse with exertion, better at rest.  She denies any cough or history of COPD or smoking history.  Denies any falls where she is hitting her head.  She denies any chest pain.  Does have a little bit of swelling to her bilateral legs but denies one being bigger than the other.          Past Medical History:  Diagnosis Date  . Breast cancer ([place]) 03-17-2009   lt Mastectomy and chemo  . Cancer ([place])   . GERD (gastroesophageal reflux disease)   . History of bone marrow biopsy 06/03/2019  . Neoplastic malignant related fatigue     Patient Active Problem List   Diagnosis Date Noted  . Early satiety   . Heartburn   . Gastritis without bleeding   . Goals of care, counseling/discussion 06/09/2019  . Cancer, metastatic to bone ([place]) 06/09/2019  . Thrombocytopenia ([place]) 12/19/2018  . Primary osteoarthritis of right shoulder 10/15/2018  . Arthritis of right  glenohumeral joint 10/15/2018  . Chronic right shoulder pain 10/15/2018  . Anxiety 04/21/2018  . Fungal nail infection 04/21/2018  . History of left breast cancer 02/01/2016  . BMI 33.0-33.9,adult 11/29/2015  . Primary cancer of left breast with metastasis to other site ([place]) 09/28/2015  . Osteopenia 09/28/2015  . GERD (gastroesophageal reflux disease) 09/16/2014    Past Surgical History:  Procedure Laterality Date  . ABDOMINAL HYSTERECTOMY    . AUGMENTATION MAMMAPLASTY Right    RT BREAST IMPLANT ONLY  . ESOPHAGOGASTRODUODENOSCOPY (EGD) WITH PROPOFOL N/A 07/17/2019   Procedure: ESOPHAGOGASTRODUODENOSCOPY (EGD) WITH PROPOFOL;  Surgeon: Lucilla Lame, MD;  Location: [facility] ENDOSCOPY;  Service: Endoscopy;  Laterality: N/A;  . MASTECTOMY Left 2011  . OOPHORECTOMY      Prior to Admission medications   Medication Sig Start Date End Date Taking? Authorizing Provider  abemaciclib (VERZENIO) 150 MG tablet Take 1 tablet (150 mg total) by mouth every 12 (twelve) hours. Patient not taking: Reported on 08/18/2019 07/22/19   Cammie Sickle, MD  ALPRAZolam Duanne Moron) 1 MG tablet Take 1 tablet (1 mg total) by mouth at bedtime as needed for anxiety. Patient not taking: Reported on 08/18/2019 06/12/19   Verl Bangs, FNP  Boswellia-Glucosamine-Vit D (OSTEO BI-FLEX ONE PER DAY PO) Take by mouth.    [provider]  cholecalciferol (VITAMIN D) 1000 units tablet Take 1,000 Units by mouth  daily.    [provider]  dexlansoprazole (DEXILANT) 60 MG capsule Take 1 capsule (60 mg total) by mouth daily. 07/17/19   Lucilla Lame, MD  diphenoxylate-atropine (LOMOTIL) 2.5-0.025 MG tablet Take 1 tablet by mouth 4 (four) times daily as needed for diarrhea or loose stools. Take it along with immodium 08/04/19   Cammie Sickle, MD  ergocalciferol (VITAMIN D2) 1.25 MG (50000 UT) capsule Take 1 capsule (50,000 Units total) by mouth once a week. 06/23/19   Cammie Sickle, MD  ondansetron  (ZOFRAN) 8 MG tablet Take one pill 30-60 mins prior to taking Verzinio to prevent nausea. 07/23/19   Cammie Sickle, MD  PARoxetine (PAXIL) 20 MG tablet Take 1 tablet (20 mg total) by mouth daily. 05/11/19   Malfi, Lupita Raider, FNP  prochlorperazine (COMPAZINE) 10 MG tablet Take 1 tablet (10 mg total) by mouth every 6 (six) hours as needed for nausea or vomiting. 08/10/19   Jacquelin Hawking, NP  trimethoprim (TRIMPEX) 100 MG tablet Take 1 tablet (100 mg total) by mouth daily. 08/05/19   Hollice Espy, MD    Allergies Influenza vaccines  Family History  Problem Relation Age of Onset  . Diabetes Mother   . Arthritis Mother   . Heart disease Father   . Breast cancer Neg Hx     Social History Social History   Tobacco Use  . Smoking status: Former Smoker    Quit date: 03/05/1984    Years since quitting: 35.4  . Smokeless tobacco: Never Used  Vaping Use  . Vaping Use: Never used  Substance Use Topics  . Alcohol use: No  . Drug use: No      Review of Systems Constitutional: No fever/chills Eyes: No visual changes. ENT: No sore throat. Cardiovascular: No chest pain Respiratory: Positive for SOB Gastrointestinal: No abdominal pain.  No nausea, no vomiting.  No diarrhea.  No constipation. Genitourinary: Negative for dysuria. Musculoskeletal: Negative for back pain. Skin: Negative for rash. Neurological: Negative for headaches, focal weakness or numbness. All other ROS negative ____________________________________________   PHYSICAL EXAM:  VITAL SIGNS: ED Triage Vitals  Enc Vitals Group     BP 08/18/19 1451 107/66     Pulse Rate 08/18/19 1451 97     Resp 08/18/19 1451 20     Temp 08/18/19 1451 98.3 F (36.8 C)     Temp Source 08/18/19 1451 Oral     SpO2 08/18/19 1451 98 %     Weight 08/18/19 1453 174 lb 2.6 oz (79 kg)     Height 08/18/19 1453 '5\' 6"'$  (1.676 m)     Head Circumference --      Peak Flow --      Pain Score 08/18/19 1453 0     Pain Loc --      Pain Edu?  --      Excl. in [place]? --     Constitutional: Alert and oriented. Well appearing and in no acute distress. Eyes: Conjunctivae are normal. EOMI. Head: Atraumatic. Nose: No congestion/rhinnorhea. Mouth/Throat: Mucous membranes are moist.   Neck: No stridor. Trachea Midline. FROM Cardiovascular: Normal rate, regular rhythm. Grossly normal heart sounds.  Good peripheral circulation. Respiratory: Clear lungs bilaterally, no increased work of breathing but on 2 L of oxygen Gastrointestinal: Soft and nontender. No distention. No abdominal bruits.  Musculoskeletal: No lower extremity tenderness nor edema.  No joint effusions. Neurologic:  Normal speech and language. No gross focal neurologic deficits are appreciated.  Skin:  Skin  is warm, dry and intact. No rash noted. Psychiatric: Mood and affect are normal. Speech and behavior are normal. GU: Deferred   ____________________________________________   LABS (all labs ordered are listed, but only abnormal results are displayed)  Labs Reviewed  BRAIN NATRIURETIC PEPTIDE - Abnormal; Notable for the following components:      Result Value   B Natriuretic Peptide 268.7 (*)    All other components within normal limits  SARS CORONAVIRUS 2 BY RT PCR (HOSPITAL ORDER, [facility])  TROPONIN I (HIGH SENSITIVITY)  TROPONIN I (HIGH SENSITIVITY)   ____________________________________________   ED ECG REPORT I, Vanessa Harcourt, the attending physician, personally viewed and interpreted this ECG.  Normal sinus rate 98, no ST elevation, no T wave inversions, normal intervals ____________________________________________  RADIOLOGY Robert Bellow, personally viewed and evaluated these images (plain radiographs) as part of my medical decision making, as well as reviewing the written report by the radiologist.  ED MD interpretation: No pneumonia noted.  Trace right pleural effusion  Official radiology report(s): DG Chest 2  View  Result Date: 08/18/2019 CLINICAL DATA:  Dyspnea on exertion for 2 weeks, history of metastatic breast cancer EXAM: CHEST - 2 VIEW COMPARISON:  06/10/2019 FINDINGS: Frontal and lateral views of the chest demonstrate extensive diffuse sclerotic bony metastases unchanged since recent chest CT. Minimal blunting of the right costophrenic angle may reflect small effusion. No airspace disease or pneumothorax. Cardiac silhouette is unremarkable. IMPRESSION: 1. Trace right pleural effusion. 2. Extensive sclerotic bony metastases. Electronically Signed   By: Randa Ngo M.D.   On: 08/18/2019 15:38   CT Head Wo Contrast  Result Date: 08/18/2019 CLINICAL DATA:  Headache history of metastatic breast cancer EXAM: CT HEAD WITHOUT CONTRAST TECHNIQUE: Contiguous axial images were obtained from the base of the skull through the vertex without intravenous contrast. COMPARISON:  CT brain 11/18/2010 FINDINGS: Brain: No acute territorial infarction, hemorrhage or intracranial mass. The ventricles are nonenlarged. Small chronic infarct in the right cerebellum. Stable ventricle size. Vascular: No hyperdense vessels.  No unexpected calcification Skull: Normal. Negative for fracture or focal lesion. Sinuses/Orbits: No acute finding. Other: None IMPRESSION: Negative.  No CT evidence for acute intracranial abnormality Electronically Signed   By: Donavan Foil M.D.   On: 08/18/2019 20:11   CT Angio Chest PE W and/or Wo Contrast  Result Date: 08/18/2019 CLINICAL DATA:  Shortness of breath history of breast cancer EXAM: CT ANGIOGRAPHY CHEST WITH CONTRAST TECHNIQUE: Multidetector CT imaging of the chest was performed using the standard protocol during bolus administration of intravenous contrast. Multiplanar CT image reconstructions and MIPs were obtained to evaluate the vascular anatomy. CONTRAST:  75m OMNIPAQUE IOHEXOL 350 MG/ML SOLN COMPARISON:  Chest x-ray 08/18/2019, CT chest 06/10/2019, PET CT 06/18/2018 FINDINGS:  Cardiovascular: Satisfactory opacification of the pulmonary arteries to the segmental level. No evidence of pulmonary embolism. Nonaneurysmal aorta. No dissection. Cardiac size within normal limits. Small pericardial effusion. Mediastinum/Nodes: Midline trachea. No thyroid mass. Left axillary adenopathy with nodes measuring up to 16 mm. Esophagus within normal limits Lungs/Pleura: Moderate right pleural effusion. Patchy bilateral ground-glass density and septal thickening suspect for edema. Upper Abdomen: No acute abnormality.  Small amount of ascites Musculoskeletal: Post mastectomy and reconstruction changes on the left. Right breast implant. Widespread sclerosis involving the shoulder girdles, sternum, ribs and spine consistent with skeletal metastatic disease, progression since 06/10/2019 comparison CT Review of the MIP images confirms the above findings. IMPRESSION: 1. Negative for acute pulmonary embolus or  aortic dissection. 2. Moderate right pleural effusion.  Small pericardial effusion. 3. Hazy bilateral lung densities with septal thickening suggestive of edema 4. Mild left axillary adenopathy consistent with metastatic disease. Widespread skeletal sclerosis consistent with metastatic disease, progressed as compared with 06/10/2019. 5. Small amount of upper abdominal ascites Electronically Signed   By: Donavan Foil M.D.   On: 08/18/2019 20:20    ____________________________________________   PROCEDURES  Procedure(s) performed (including Critical Care):  .Critical Care Performed by: Vanessa Lamboglia, MD Authorized by: Vanessa [place], MD   Critical care provider statement:    Critical care time (minutes):  35   Critical care was time spent personally by me on the following activities:  Discussions with consultants, evaluation of patient's response to treatment, examination of patient, ordering and performing treatments and interventions, ordering and review of laboratory studies, ordering and  review of radiographic studies, pulse oximetry, re-evaluation of patient's condition, obtaining history from patient or surrogate and review of old charts     ____________________________________________   INITIAL IMPRESSION / ASSESSMENT AND PLAN / ED COURSE   Natasha Dennis was evaluated in Emergency Department on 08/18/2019 for the symptoms described in the history of present illness. She was evaluated in the context of the global COVID-19 pandemic, which necessitated consideration that the patient might be at risk for infection with the SARS-CoV-2 virus that causes COVID-19. Institutional protocols and algorithms that pertain to the evaluation of patients at risk for COVID-19 are in a state of rapid change based on information released by regulatory bodies including the CDC and federal and state organizations. These policies and algorithms were followed during the patient's care in the ED.     Pt presents with SOB.  Concern for PE given history of cancer.  Will get CT scan PNA-will get xray to evaluation Anemia-CBC to evaluate ACS- will get trops Arrhythmia-Will get EKG and keep on monitor.  COVID- will get testing per algorithm.   CT scan shows pleural effusion BNP slightly elevated with possible pulmonary edema we will give a dose of IV Lasix.  Most likely malignant effusion from her known cancer.  Given patient is hypoxic with ambulation requiring 2 L of oxygen will discuss possible team for admission.               ____________________________________________   FINAL CLINICAL IMPRESSION(S) / ED DIAGNOSES   Final diagnoses:  Acute respiratory failure with hypoxia (HCC)  Malignant neoplasm of female breast, unspecified estrogen receptor status, unspecified laterality, unspecified site of breast (HCC)  Pleural effusion     MEDICATIONS GIVEN DURING THIS VISIT:  Medications  iohexol (OMNIPAQUE) 350 MG/ML injection 60 mL (60 mLs Intravenous Contrast Given 08/18/19  1946)  furosemide (LASIX) injection 40 mg (40 mg Intravenous Given 08/18/19 2040)     ED Discharge Orders    None       Note:  This document was prepared using Dragon voice recognition software and may include unintentional dictation errors.   Vanessa Cornelius, MD 08/18/19 2110

## 2019-08-18 NOTE — Progress Notes (Signed)
Symptom Management [place]  [phone] [phone]  Patient Care Team: Malfi, Lupita Raider, FNP as PCP - General (Family Medicine) Cammie Sickle, MD as Consulting Physician (Internal Medicine) Lucilla Lame, MD as Consulting Physician (Gastroenterology) Hollice Espy, MD as Consulting Physician (Urology)   Name of the patient: Natasha Dennis  [id number]  1966/01/06   Date of visit: 08/18/19  Reason for Consult:  Ms. Hailey Stormer is a 54 year old woman with multiple medical problems including recurrent stage IV breast cancer metastatic to bone.  Cancer was initially diagnosed in 2010 a stage II.  Patient underwent neoadjuvant chemotherapy/mastectomy/reconstruction.  She received 4 years of endocrine therapy was ultimately discontinued due to joint pains.  Patient was found to have a infiltrative lesion in the duodenal area on CT scan in March 2021.  PET scan in April 2021 confirmed left axilla adenopathy with multiple bone lesions.  Patient underwent lymph node biopsy positive for breast carcinoma.    Patient was started on abemaciclib but had progressive nausea, vomiting, and diarrhea.  She also had subsequent weakness and shortness of breath.  Patient spoke with Dr. Rogue Bussing 16/14/21 and recommended to discontinue the abemaciclib and follow-up in the [facility] today for labs and IV fluids.  Today, patient reports that she is feeling some better.  She has not had nausea, vomiting, or diarrhea today.  She does endorse several weeks of weakness, poor oral intake, and exertional dyspnea.  Patient denies fever or chills.  She denies chest pain.    Denies any neurologic complaints. Denies recent fevers or illnesses. Denies any easy bleeding or bruising. Reports good appetite and denies weight loss. Denies chest pain. Denies any nausea, vomiting, constipation, or diarrhea. Denies urinary complaints. Patient offers no further specific  complaints today.  PAST MEDICAL HISTORY: Past Medical History:  Diagnosis Date  . Breast cancer ([place]) 03-17-2009   lt Mastectomy and chemo  . Cancer ([place])   . GERD (gastroesophageal reflux disease)   . History of bone marrow biopsy 06/03/2019  . Neoplastic malignant related fatigue     PAST SURGICAL HISTORY:  Past Surgical History:  Procedure Laterality Date  . ABDOMINAL HYSTERECTOMY    . AUGMENTATION MAMMAPLASTY Right    RT BREAST IMPLANT ONLY  . ESOPHAGOGASTRODUODENOSCOPY (EGD) WITH PROPOFOL N/A 07/17/2019   Procedure: ESOPHAGOGASTRODUODENOSCOPY (EGD) WITH PROPOFOL;  Surgeon: Lucilla Lame, MD;  Location: [facility] ENDOSCOPY;  Service: Endoscopy;  Laterality: N/A;  . MASTECTOMY Left 2011  . OOPHORECTOMY      HEMATOLOGY/ONCOLOGY HISTORY:  Oncology History Overview Note  #  May 2010-stage II lobular cancer left breast; [Dr.Choksi] on a protocol received neoadjuvant chemotherapy s/p mastectomy reconstruction/TRAM; right breast augmentation.  Initially tamoxifen until July 2012-then Femara 2.5 mg; stopped because of joint pains; July 2013-Aromasin again stopped because of joint pains [total of 4 years-endocrine therapy].  #Bilateral salpingo-oophorectomy.   #March/April 2021-thrombocytopenia/worsening anemia-bone marrow biopsy-"carcinoma".  ER/PR status HER-2/neu status-NA; March 2021 abdomen pelvis CT scan-vague infiltrative lesion noted duodenal area; June 08, 2019-PET scan left axilla adenopathy; multiple bone lesions.   #April 2021-left axial lymph node biopsy; positive for breast carcinoma; NGS pending.    # 20th April 2021-Faslodex  # NGS/MOLECULAR TESTS:    # PALLIATIVE CARE EVALUATION:  # PAIN MANAGEMENT:    DIAGNOSIS:   STAGE:         ;  GOALS:  CURRENT/MOST RECENT THERAPY :     Primary cancer of left breast with metastasis to other site ([place])  07/05/2008 Initial  Diagnosis   History is obtained through chart review from Dr. Metro Kung notes. She was originally  diagnosed with breast cancer, invasive lobular carcinoma in May 2010. She was placed on the protocol and received neoadjuvant chemotherapy. She had left mastectomy with reconstruction surgery and right breast augmentation with implant. Subsequently she was placed on tamoxifen until July of 2012, then Femara 2.5 mg.  Femara was stopped because of bony pains July of 2013. She was then switched to Aromasin. Patient has stopped taking Aromasin because of perceived side effect. In total, she had received anti-estrogen therapy for almost 4 years   10/17/2010 Surgery   She had bilateral salpingo-oophorectomy   01/21/2016 Imaging   No MRI evidence of malignancy in the right breast or reconstructed left breast (TRAM flap).     ALLERGIES:  is allergic to influenza vaccines.  MEDICATIONS:  Current Outpatient Medications  Medication Sig Dispense Refill  . [place]-Glucosamine-Vit D (OSTEO BI-FLEX ONE PER DAY PO) Take by mouth.    . cholecalciferol (VITAMIN D) 1000 units tablet Take 1,000 Units by mouth daily.    Marland Kitchen dexlansoprazole (DEXILANT) 60 MG capsule Take 1 capsule (60 mg total) by mouth daily. 30 capsule 11  . diphenoxylate-atropine (LOMOTIL) 2.5-0.025 MG tablet Take 1 tablet by mouth 4 (four) times daily as needed for diarrhea or loose stools. Take it along with immodium 30 tablet 0  . ergocalciferol (VITAMIN D2) 1.25 MG (50000 UT) capsule Take 1 capsule (50,000 Units total) by mouth once a week. 12 capsule 1  . ondansetron (ZOFRAN) 8 MG tablet Take one pill 30-60 mins prior to taking Verzinio to prevent nausea. 40 tablet 1  . PARoxetine (PAXIL) 20 MG tablet Take 1 tablet (20 mg total) by mouth daily. 90 tablet 1  . prochlorperazine (COMPAZINE) 10 MG tablet Take 1 tablet (10 mg total) by mouth every 6 (six) hours as needed for nausea or vomiting. 30 tablet 3  . trimethoprim (TRIMPEX) 100 MG tablet Take 1 tablet (100 mg total) by mouth daily. 90 tablet 0  . abemaciclib (VERZENIO) 150 MG tablet Take  1 tablet (150 mg total) by mouth every 12 (twelve) hours. (Patient not taking: Reported on 08/18/2019) 56 tablet 6  . ALPRAZolam (XANAX) 1 MG tablet Take 1 tablet (1 mg total) by mouth at bedtime as needed for anxiety. (Patient not taking: Reported on 08/18/2019) 30 tablet 0   No current facility-administered medications for this visit.   Facility-Administered Medications Ordered in Other Visits  Medication Dose Route Frequency Provider Last Rate Last Admin  . potassium chloride 40 mEq in sodium chloride 0.9 % 1,020 mL (0.0392 mEq/mL) infusion - Peripheral Line  40 mEq Intravenous Once Margene Cherian, Kirt Boys, NP 255 mL/hr at 08/18/19 1154 40 mEq at 08/18/19 1154    VITAL SIGNS: BP 117/67 (BP Location: Right Arm, Patient Position: Sitting)   Pulse (!) 112   Temp 98.5 F (36.9 C) (Tympanic)   Resp 20   Wt 176 lb (79.8 kg)   SpO2 92% Comment: down to 86 with ambulation  BMI 28.41 kg/m  Filed Weights   08/18/19 1110  Weight: 176 lb (79.8 kg)    Estimated body mass index is 28.41 kg/m as calculated from the following:   Height as of 07/29/19: _0  (1.676 m).   Weight as of this encounter: 176 lb (79.8 kg).  LABS: CBC:    Component Value Date/Time   WBC 10.2 08/18/2019 1038   HGB 8.9 (L) 08/18/2019 1038   HGB 13.2 12/02/2018  1342   HCT 28.6 (L) 08/18/2019 1038   HCT 39.9 12/02/2018 1342   PLT 131 (L) 08/18/2019 1038   PLT 99 (LL) 12/02/2018 1342   MCV 87.2 08/18/2019 1038   MCV 83 12/02/2018 1342   MCV 83 06/03/2014 1447   NEUTROABS 4.1 08/18/2019 1038   NEUTROABS 5.1 12/02/2018 1342   NEUTROABS 4.9 06/03/2014 1447   LYMPHSABS 5.2 (H) 08/18/2019 1038   LYMPHSABS 3.1 12/02/2018 1342   LYMPHSABS 2.3 06/03/2014 1447   MONOABS 0.7 08/18/2019 1038   MONOABS 0.6 06/03/2014 1447   EOSABS 0.0 08/18/2019 1038   EOSABS 0.2 12/02/2018 1342   EOSABS 0.1 06/03/2014 1447   BASOSABS 0.1 08/18/2019 1038   BASOSABS 0.1 12/02/2018 1342   BASOSABS 0.1 06/03/2014 1447   Comprehensive  Metabolic Panel:    Component Value Date/Time   NA 131 (L) 08/18/2019 1038   NA 141 10/21/2018 1613   NA 135 06/03/2014 1447   K 2.8 (L) 08/18/2019 1038   K 3.8 06/03/2014 1447   CL 100 08/18/2019 1038   CL 104 06/03/2014 1447   CO2 20 (L) 08/18/2019 1038   CO2 25 06/03/2014 1447   BUN 25 (H) 08/18/2019 1038   BUN 14 10/21/2018 1613   BUN 24 (H) 06/03/2014 1447   CREATININE 1.53 (H) 08/18/2019 1038   CREATININE 0.77 09/28/2015 1603   GLUCOSE 120 (H) 08/18/2019 1038   GLUCOSE 96 06/03/2014 1447   CALCIUM 7.3 (L) 08/18/2019 1038   CALCIUM 9.1 06/03/2014 1447   AST 88 (H) 08/18/2019 1038   AST 21 06/03/2014 1447   ALT 38 08/18/2019 1038   ALT 20 06/03/2014 1447   ALKPHOS 359 (H) 08/18/2019 1038   ALKPHOS 88 06/03/2014 1447   BILITOT 0.8 08/18/2019 1038   BILITOT 0.4 10/21/2018 1613   BILITOT 0.6 06/03/2014 1447   PROT 5.9 (L) 08/18/2019 1038   PROT 6.4 10/21/2018 1613   PROT 7.1 06/03/2014 1447   ALBUMIN 2.6 (L) 08/18/2019 1038   ALBUMIN 4.2 10/21/2018 1613   ALBUMIN 4.4 06/03/2014 1447    RADIOGRAPHIC STUDIES: No results found.  PERFORMANCE STATUS (ECOG) : 1 - Symptomatic but completely ambulatory  Review of Systems Unless otherwise noted, a complete review of systems is negative.  Physical Exam General: NAD Cardiovascular: regular rate and rhythm Pulmonary: clear ant/post fields Abdomen: soft, nontender, + bowel sounds GU: no suprapubic tenderness Extremities: + BLE edema, no joint deformities Skin: no rashes Neurological: Weakness but otherwise nonfocal  Assessment and Plan- Ms. Kaeleen Odom is a 54 year old woman with multiple medical problems including recurrent stage IV breast cancer metastatic to bone on abemaciclib but was discontinued yesterday due to N/V/D. She presented to the [facility] for labs/IVFs.  Patient was accompanied by her daughter and husband.  Hyponatremia - most likely secondary to dehydration. Will give NS 1L x 1. Encouraged oral  intake.  Hypokalemia - K of 2.8. Likely secondary to GI losses from N/V/D. Give KCL 44mq in IVFs. Recheck labs tomorrow.   AKI -serum creatinine 1.53.  Baseline is 0.88.  This is likely prerenal from dehydration.  Again, will give IV fluids today.  N/V/D - Improved today. secondary to abemaciclib, which has now been discontinued. Supportive care with as needed antiemetics/antidiarrheals.  Exertional dyspnea/hypoxia - SaO2 80s upon arrival in clinic. She was placed on 2L O2 and later had Sao2 70s on ambulation. This corrected with rest. Unclear etiology but will need imaging to R/O PE.  Patient had CTA of the chest  on 06/10/2019 which was negative for PE but she did have some lymphadenopathy.   Case and plan discussed with Dr. Rogue Bussing who also saw and examined the patient. Patient will be taken to the ER for further workup.   Patient expressed understanding and was in agreement with this plan. She also understands that She can call clinic at any time with any questions, concerns, or complaints.   Thank you for allowing me to participate in the care of this very pleasant patient.   Time Total: 30 minutes  Visit consisted of counseling and education dealing with the complex and emotionally intense issues of symptom management and palliative care in the setting of serious and potentially life-threatening illness.Greater than 50%  of this time was spent counseling and coordinating care related to the above assessment and plan.  Signed by: Altha Harm, PhD, NP-C

## 2019-08-18 NOTE — ED Notes (Signed)
Attempted for 20g IV at R ac. L arm cannot be stuck d/t mastectomy. Will have 2nd RN try.

## 2019-08-18 NOTE — ED Notes (Signed)
Verbal okay from provider [place] for pt to [place]. Pt notified and family has food for her at bedside. Given gingerale as requested. Verbal order to switch to NPO at midnight. Pt understands and is agreeable.

## 2019-08-18 NOTE — ED Triage Notes (Signed)
Pt from cancer center- states that she has been getting sob with exertion, she was walked and sats dropped to 70's. Placed on 2 L [place] and resting pt is 97%. Denies pain.

## 2019-08-18 NOTE — ED Triage Notes (Signed)
Pt over from [facility] with hyopxia on exertion. NP reports she may not be able to tolerate a CT of chest and may need a VQ scan to rule out PE.

## 2019-08-18 NOTE — ED Notes (Addendum)
Crissy, RN to try for 20g IV above wrist soon. Pt placed back on 2L O2 as 91% RA. Pink band applied to L wrist.

## 2019-08-18 NOTE — ED Notes (Signed)
Pt to be moved to ED33 soon.

## 2019-08-18 NOTE — ED Notes (Signed)
Pt back from imaging

## 2019-08-18 NOTE — H&P (Signed)
Natasha Dennis GTX:[id number] DOB: August 13, 1965 DOA: 08/18/2019     PCP: Verl Bangs, FNP   Outpatient Specialists:      Oncology  Dr. Rogue Bussing  urology Dr. Erlene Quan GI Dr. Allen Norris Patient arrived to ER on 08/18/19 at 1434  Patient coming from: home Lives  With family    Chief Complaint:  Chief Complaint  Patient presents with  . Shortness of Breath    HPI: Natasha Dennis is a 54 y.o. female with medical history significant of metastic breast ca with mets to bone marrow, GERD    Presented with  Nausea vomiting, diarrhea 3-4 loose bowel movements per day after she taking her  abemaciclib she has been January fatigue and complaint of abdominal pain.  Of note she does have area of Infiltrative area in her duodenal status post endoscopy and biopsy. Patient's abemaciclib was stopped by her oncologist on 17 August 2019 Was trying to get fluids and potasium replaced but noted hypoxia. In oncology the office she was noted to be tachycardic up to 112 and satting low 92 on room air down to 86 with ambulation She was sent from oncology clinic to emergency department Reports her shortness of breath been going on for past few weeks Her potassium today was noted as well as 2.8 she was given 40 mEq IV In the office today her creatinine was noted to be up to 1.53 from baseline 0.88 she was treated with IV fluids Patient endorses that she recently has been losing a lot of weight.  Have had very decreased appetite And overall feels fairly poorly Infectious risk factors:  Reports shortness of breath, severe fatigue     Has   been vaccinated against COVID in January  In  ER  COVID TEST  NEGATIVE   Lab Results  Component Value Date   [place] 08/18/2019   [place] NEGATIVE 07/15/2019    Regarding pertinent Chronic problems:   History of metastatic breast cancer currently on Faslodex   Has recurrent UTIs followed by urology     While in ER: Hypoxia with  ambulation CT showing no PE just new pulmonary effusion   Hospitalist was called for admission for acute respiratory failure with hypoxia secondary to new pleural effusion likely related to malignancy and noted to have AKI  The following Work up has been ordered so far:  Orders Placed This Encounter  Procedures  . Critical Care  . SARS Coronavirus 2 by RT PCR (hospital order, performed in [facility] hospital lab) Nasopharyngeal Nasopharyngeal Swab  . DG Chest 2 View  . CT Angio Chest PE W and/or Wo Contrast  . CT Head Wo Contrast  . Brain natriuretic peptide  . If O2 Sat <94% administer O2 at 2 liters/minute via nasal cannula  . Consult to hospitalist  ALL PATIENTS BEING ADMITTED/HAVING PROCEDURES NEED COVID-19 SCREENING  . Pulse oximetry, continuous  . ED EKG    Following Medications were ordered in ER: Medications  iohexol (OMNIPAQUE) 350 MG/ML injection 60 mL (60 mLs Intravenous Contrast Given 08/18/19 1946)  furosemide (LASIX) injection 40 mg (40 mg Intravenous Given 08/18/19 2040)        Consult Orders  (From admission, onward)         Start     Ordered   08/18/19 2031  Consult to hospitalist  ALL PATIENTS BEING ADMITTED/HAVING PROCEDURES NEED COVID-19 SCREENING  Once       Comments: ALL PATIENTS BEING ADMITTED/HAVING PROCEDURES NEED COVID-19 SCREENING  Provider:  (Not yet assigned)  Question Answer Comment  Place call to: [phone]   Reason for Consult Admit      08/18/19 2030          Significant initial  Findings: Abnormal Labs Reviewed  BRAIN NATRIURETIC PEPTIDE - Abnormal; Notable for the following components:      Result Value   B Natriuretic Peptide 268.7 (*)    All other components within normal limits     Otherwise labs showing:   Recent Labs  Lab 08/18/19 1038  NA 131*  K 2.8*  CO2 20*  GLUCOSE 120*  BUN 25*  CREATININE 1.53*  CALCIUM 7.3*  MG 2.1    Cr   ,  Up from baseline see below Lab Results  Component Value Date   CREATININE  1.53 (H) 08/18/2019   CREATININE 0.88 08/04/2019   CREATININE 0.67 06/23/2019    Recent Labs  Lab 08/18/19 1038  AST 88*  ALT 38  ALKPHOS 359*  BILITOT 0.8  PROT 5.9*  ALBUMIN 2.6*   Lab Results  Component Value Date   CALCIUM 7.3 (L) 08/18/2019      WBC      Component Value Date/Time   WBC 10.2 08/18/2019 1038   ANC    Component Value Date/Time   NEUTROABS 4.1 08/18/2019 1038   NEUTROABS 5.1 12/02/2018 1342   NEUTROABS 4.9 06/03/2014 1447   ALC No components found for: LYMPHAB   Plt: Lab Results  Component Value Date   PLT 131 (L) 08/18/2019      COVID-19 Labs    Lab Results  Component Value Date   SARSCOV2NAA NEGATIVE 08/18/2019   [place] NEGATIVE 07/15/2019       HG/HCT   Down from baseline see below    Component Value Date/Time   HGB 8.9 (L) 08/18/2019 1038   HGB 13.2 12/02/2018 1342   HCT 28.6 (L) 08/18/2019 1038   HCT 39.9 12/02/2018 1342    No results for input(s): LIPASE, AMYLASE in the last 168 hours. No results for input(s): AMMONIA in the last 168 hours.  No components found for: LABALBU   ECG: Ordered Personally reviewed by me showing: HR : 98 Rhythm:  NSR,    no evidence of ischemic changes QTC 477   BNP (last 3 results) Recent Labs    08/18/19 1846  BNP 268.7*    DM  labs:  HbA1C: Recent Labs    10/21/18 1613  HGBA1C 5.2         UA  ordered   Urine analysis:    Component Value Date/Time   COLORURINE YELLOW (A) 07/03/2019 0958   APPEARANCEUR Cloudy (A) 07/29/2019 0831   LABSPEC 1.006 07/03/2019 0958   LABSPEC 1.015 10/26/2013 1537   PHURINE 7.0 07/03/2019 0958   GLUCOSEU Negative 07/29/2019 0831   GLUCOSEU Negative 10/26/2013 1537   HGBUR NEGATIVE 07/03/2019 0958   BILIRUBINUR Negative 07/29/2019 0831   BILIRUBINUR Negative 10/26/2013 1537   [place] 07/03/2019 0958   PROTEINUR Negative 07/29/2019 0831   PROTEINUR NEGATIVE 07/03/2019 0958   UROBILINOGEN negative 09/16/2014 1128    NITRITE Positive (A) 07/29/2019 0831   NITRITE POSITIVE (A) 07/03/2019 0958   LEUKOCYTESUR 3+ (A) 07/29/2019 0831   LEUKOCYTESUR LARGE (A) 07/03/2019 0958   LEUKOCYTESUR 3+ 10/26/2013 1537       Ordered  CT HEAD  NON acute  CXR - trance right pleural effusion   US liver -small amount of perihepatic ascites she is  CTA chest -  no PE,Moderate right pleural effusion     ED Triage Vitals  Enc Vitals Group     BP 08/18/19 1451 107/66     Pulse Rate 08/18/19 1451 97     Resp 08/18/19 1451 20     Temp 08/18/19 1451 98.3 F (36.8 C)     Temp Source 08/18/19 1451 Oral     SpO2 08/18/19 1451 98 %     Weight 08/18/19 1453 174 lb 2.6 oz (79 kg)     Height 08/18/19 1453 '5\' 6"'  (1.676 m)     Head Circumference --      Peak Flow --      Pain Score 08/18/19 1453 0     Pain Loc --      Pain Edu? --      Excl. in [place]? --   TMAX(24)@       Latest  Blood pressure 112/66, pulse 93, temperature 98.3 F (36.8 C), temperature source Oral, resp. rate (!) 21, height '5\' 6"'  (1.676 m), weight 79 kg, SpO2 100 %.    Review of Systems:    Pertinent positives include: fatigue, weight loss   abdominal pain, nausea, vomiting, diarrhea,  shortness of breath at rest Constitutional:  No weight loss, night sweats, Fevers, chills, HEENT:  No headaches, Difficulty swallowing,Tooth/dental problems,Sore throat,  No sneezing, itching, ear ache, nasal congestion, post nasal drip,  Cardio-vascular:  No chest pain, Orthopnea, PND, anasarca, dizziness, palpitations.no Bilateral lower extremity swelling  GI:  No heartburn, indigestion, change in bowel habits, loss of appetite, melena, blood in stool, hematemesis Resp:  no. No dyspnea on exertion, No excess mucus, no productive cough, No non-productive cough, No coughing up of blood.No change in color of mucus.No wheezing. Skin:  no rash or lesions. No jaundice GU:  no dysuria, change in color of urine, no urgency or frequency. No straining to urinate.  No  flank pain.  Musculoskeletal:  No joint pain or no joint swelling. No decreased range of motion. No back pain.  Psych:  No change in mood or affect. No depression or anxiety. No memory loss.  Neuro: no localizing neurological complaints, no tingling, no weakness, no double vision, no gait abnormality, no slurred speech, no confusion  All systems reviewed and apart from [place] all are negative  Past Medical History:   Past Medical History:  Diagnosis Date  . Breast cancer ([place]) 03-17-2009   lt Mastectomy and chemo  . Cancer ([place])   . GERD (gastroesophageal reflux disease)   . History of bone marrow biopsy 06/03/2019  . Neoplastic malignant related fatigue      Past Surgical History:  Procedure Laterality Date  . ABDOMINAL HYSTERECTOMY    . AUGMENTATION MAMMAPLASTY Right    RT BREAST IMPLANT ONLY  . ESOPHAGOGASTRODUODENOSCOPY (EGD) WITH PROPOFOL N/A 07/17/2019   Procedure: ESOPHAGOGASTRODUODENOSCOPY (EGD) WITH PROPOFOL;  Surgeon: Lucilla Lame, MD;  Location: [facility] ENDOSCOPY;  Service: Endoscopy;  Laterality: N/A;  . MASTECTOMY Left 2011  . OOPHORECTOMY      Social History:  Ambulatory  Independently      reports that she quit smoking about 35 years ago. She has never used smokeless tobacco. She reports that she does not drink alcohol and does not use drugs.   Family History:   Family History  Problem Relation Age of Onset  . Diabetes Mother   . Arthritis Mother   . Heart disease Father   . Breast cancer Neg Hx     Allergies: Allergies  Allergen  Reactions  . Influenza Vaccines Swelling    Joint swelling, rash     Prior to Admission medications   Medication Sig Start Date End Date Taking? Authorizing Provider  abemaciclib (VERZENIO) 150 MG tablet Take 1 tablet (150 mg total) by mouth every 12 (twelve) hours. Patient not taking: Reported on 08/18/2019 07/22/19   Cammie Sickle, MD  ALPRAZolam Duanne Moron) 1 MG tablet Take 1 tablet (1 mg total) by mouth at bedtime as  needed for anxiety. Patient not taking: Reported on 08/18/2019 06/12/19   Verl Bangs, FNP  Boswellia-Glucosamine-Vit D (OSTEO BI-FLEX ONE PER DAY PO) Take by mouth.    [provider]  cholecalciferol (VITAMIN D) 1000 units tablet Take 1,000 Units by mouth daily.    [provider]  dexlansoprazole (DEXILANT) 60 MG capsule Take 1 capsule (60 mg total) by mouth daily. 07/17/19   Lucilla Lame, MD  diphenoxylate-atropine (LOMOTIL) 2.5-0.025 MG tablet Take 1 tablet by mouth 4 (four) times daily as needed for diarrhea or loose stools. Take it along with immodium 08/04/19   Cammie Sickle, MD  ergocalciferol (VITAMIN D2) 1.25 MG (50000 UT) capsule Take 1 capsule (50,000 Units total) by mouth once a week. 06/23/19   Cammie Sickle, MD  ondansetron (ZOFRAN) 8 MG tablet Take one pill 30-60 mins prior to taking Verzinio to prevent nausea. 07/23/19   Cammie Sickle, MD  PARoxetine (PAXIL) 20 MG tablet Take 1 tablet (20 mg total) by mouth daily. 05/11/19   Malfi, Lupita Raider, FNP  prochlorperazine (COMPAZINE) 10 MG tablet Take 1 tablet (10 mg total) by mouth every 6 (six) hours as needed for nausea or vomiting. 08/10/19   Jacquelin Hawking, NP  trimethoprim (TRIMPEX) 100 MG tablet Take 1 tablet (100 mg total) by mouth daily. 08/05/19   Hollice Espy, MD   Physical Exam: Blood pressure 112/66, pulse 93, temperature 98.3 F (36.8 C), temperature source Oral, resp. rate (!) 21, height '5\' 6"'  (1.676 m), weight 79 kg, SpO2 100 %. 1. General:  in No  Acute distress   Chronically ill  -appearing 2. Psychological: Alert and  Oriented 3. Head/ENT:   Moist  Mucous Membranes                          Head Non traumatic, neck supple                         Poor Dentition 4. SKIN: normal   Skin turgor,  Skin clean Dry and intact no rash 5. Heart: Regular rate and rhythm no  Murmur, no Rub or gallop 6. Lungs: decreased on the right no wheezes or crackles   7. Abdomen: Soft,  non-tender,   Distended bowel sounds present 8. Lower extremities: no clubbing, cyanosis, trace edema 9. Neurologically Grossly intact, moving all 4 extremities equally  10. MSK: Normal range of motion   All other LABS:     Recent Labs  Lab 08/18/19 1038  WBC 10.2  NEUTROABS 4.1  HGB 8.9*  HCT 28.6*  MCV 87.2  PLT 131*     Recent Labs  Lab 08/18/19 1038  NA 131*  K 2.8*  CL 100  CO2 20*  GLUCOSE 120*  BUN 25*  CREATININE 1.53*  CALCIUM 7.3*  MG 2.1     Recent Labs  Lab 08/18/19 1038  AST 88*  ALT 38  ALKPHOS 359*  BILITOT 0.8  PROT 5.9*  ALBUMIN 2.6*       Cultures:    Component Value Date/Time   SDES  07/03/2019 1011    URINE, CLEAN CATCH Performed at [facility], [address] [place], [place] [address]    [facility]  07/03/2019 1011    NONE Performed at [facility], [address] [place], [place] [address]    CULT >=100,000 COLONIES/mL ESCHERICHIA COLI (A) 07/03/2019 1011   REPTSTATUS 07/05/2019 FINAL 07/03/2019 1011     Radiological Exams on Admission: DG Chest 2 View  Result Date: 08/18/2019 CLINICAL DATA:  Dyspnea on exertion for 2 weeks, history of metastatic breast cancer EXAM: CHEST - 2 VIEW COMPARISON:  06/10/2019 FINDINGS: Frontal and lateral views of the chest demonstrate extensive diffuse sclerotic bony metastases unchanged since recent chest CT. Minimal blunting of the right costophrenic angle may reflect small effusion. No airspace disease or pneumothorax. Cardiac silhouette is unremarkable. IMPRESSION: 1. Trace right pleural effusion. 2. Extensive sclerotic bony metastases. Electronically Signed   By: Randa Ngo M.D.   On: 08/18/2019 15:38   CT Head Wo Contrast  Result Date: 08/18/2019 CLINICAL DATA:  Headache history of metastatic breast cancer EXAM: CT HEAD WITHOUT CONTRAST TECHNIQUE: Contiguous axial images were obtained from the base of the skull through the vertex without intravenous contrast. COMPARISON:  CT brain  11/18/2010 FINDINGS: Brain: No acute territorial infarction, hemorrhage or intracranial mass. The ventricles are nonenlarged. Small chronic infarct in the right cerebellum. Stable ventricle size. Vascular: No hyperdense vessels.  No unexpected calcification Skull: Normal. Negative for fracture or focal lesion. Sinuses/Orbits: No acute finding. Other: None IMPRESSION: Negative.  No CT evidence for acute intracranial abnormality Electronically Signed   By: Donavan Foil M.D.   On: 08/18/2019 20:11   CT Angio Chest PE W and/or Wo Contrast  Result Date: 08/18/2019 CLINICAL DATA:  Shortness of breath history of breast cancer EXAM: CT ANGIOGRAPHY CHEST WITH CONTRAST TECHNIQUE: Multidetector CT imaging of the chest was performed using the standard protocol during bolus administration of intravenous contrast. Multiplanar CT image reconstructions and MIPs were obtained to evaluate the vascular anatomy. CONTRAST:  26m OMNIPAQUE IOHEXOL 350 MG/ML SOLN COMPARISON:  Chest x-ray 08/18/2019, CT chest 06/10/2019, PET CT 06/18/2018 FINDINGS: Cardiovascular: Satisfactory opacification of the pulmonary arteries to the segmental level. No evidence of pulmonary embolism. Nonaneurysmal aorta. No dissection. Cardiac size within normal limits. Small pericardial effusion. Mediastinum/Nodes: Midline trachea. No thyroid mass. Left axillary adenopathy with nodes measuring up to 16 mm. Esophagus within normal limits Lungs/Pleura: Moderate right pleural effusion. Patchy bilateral ground-glass density and septal thickening suspect for edema. Upper Abdomen: No acute abnormality.  Small amount of ascites Musculoskeletal: Post mastectomy and reconstruction changes on the left. Right breast implant. Widespread sclerosis involving the shoulder girdles, sternum, ribs and spine consistent with skeletal metastatic disease, progression since 06/10/2019 comparison CT Review of the MIP images confirms the above findings. IMPRESSION: 1. Negative for  acute pulmonary embolus or aortic dissection. 2. Moderate right pleural effusion.  Small pericardial effusion. 3. Hazy bilateral lung densities with septal thickening suggestive of edema 4. Mild left axillary adenopathy consistent with metastatic disease. Widespread skeletal sclerosis consistent with metastatic disease, progressed as compared with 06/10/2019. 5. Small amount of upper abdominal ascites Electronically Signed   By: KDonavan FoilM.D.   On: 08/18/2019 20:20   UKoreaAbdomen Limited RUQ  Result Date: 08/19/2019 CLINICAL DATA:  Elevated LFTs EXAM: ULTRASOUND ABDOMEN LIMITED RIGHT UPPER QUADRANT COMPARISON:  None. FINDINGS: Gallbladder: Gallbladder sludge and layering  small stones are seen. Gallbladder wall is 3.3 mm. No sonographic Murphy sign noted by sonographer. Common bile duct: Diameter: 5.2 mm Liver: No focal lesion identified. Within normal limits in parenchymal echogenicity. Portal vein is patent on color Doppler imaging with normal direction of blood flow towards the liver. Other: Incidentally noted is a small right pleural effusion. There is a small amount of perihepatic ascites. There is also mild right pelvicaliectasis present. IMPRESSION: Sludge/gallbladder stones.  No evidence of acute cholecystitis. Small amount of perihepatic ascites and right pleural effusion Mild right hydronephrosis. Electronically Signed   By: Prudencio Pair M.D.   On: 08/19/2019 00:15    Chart has been reviewed    Assessment/Plan   54 y.o. female with medical history significant of metastic breast ca with mets to bone marrow, GERD  Admitted for acute respiratory failure with hypoxia secondary to pleural effusion  Present on Admission: . Acute respiratory failure with hypoxia (HCC) -most likely secondary to pleural effusion Placed an order  for IR consult and draining in a.m. appreciate their help   . GERD (gastroesophageal reflux disease) -chronic stable continue home meds    . Primary cancer of left  breast with metastasis to other site [facility]) -notify oncology patient's been admitted  . Thrombocytopenia (HCC)-chemotherapy-induced possibly.  But will evaluate liver for any evidence of liver involvement   . Pleural effusion -most likely secondary to metastatic disease but will need IR guided thoracentesis for further evaluation of patient   . Anemia associated with chemotherapy -obtain anemia panel   . AKI (acute kidney injury) (HCC)-secondary to nausea vomiting dehydration was likely prerenal we will rehydrate and follow  . Dehydration -patient was initially rehydrated at the clinic today then tonight was given Lasix in the emergency department.  Will hold off for right now and monitor fluid status check orthostatics prior to discharge  Over aggressive fluid resuscitation may lead to worsening pleural effusion.  After thoracentesis if still orthostatic appears to be dehydrated could attempt some more gentle fluids.  Will follow carefully renal function.   . Hypokalemia - - will replace and repeat in AM,  check magnesium level and replace as needed  Hyponatremia most likely in the setting of dehydration will obtain electrolytes urine   and follow sodium will repeat after patient has been rehydrated at the clinic. Check albumin  elevated LFT ultrasound showed no liver abnormality.  Continue to follow may be drug-induced.   Other plan as per orders.  DVT prophylaxis:  SCD    Code Status:   DNR/DNI   as per patient   I had personally discussed CODE STATUS with patient and family   Family Communication:   Family  at  Bedside  plan of care was discussed   with   Husband   Disposition Plan:     To home once workup is complete and patient is stable   Following barriers for discharge:                            Electrolytes corrected                             Pain controlled with PO medications                                     Will  need to be able to tolerate PO                             Will likely need home health, home O2, set up                           Will need consultants to evaluate patient prior to discharge                      Would benefit from PT/OT eval prior to DC  Ordered                                       Consults called: Will notify oncology pt been admitted, IR consult  Admission status:  ED Disposition    ED Disposition Condition [place]: [place] [[id number]]  Level of Care: Med-Surg [16]  Covid Evaluation: Asymptomatic Screening Protocol (No Symptoms)  Admission Type: Urgent [2]  Diagnosis: Acute respiratory failure with hypoxia [facility]) [[id number]]  Admitting Physician: Toy Baker [[id number]]  Attending Physician: Toy Baker [[id number]]  Estimated length of stay: 3 - 4 days  Certification:: I certify this patient will need inpatient services for at least 2 midnights         inpatient     I Expect 2 midnight stay secondary to severity of patient's current illness need for inpatient interventions justified by the following:  hemodynamic instability despite optimal treatment (tachycardia  hypoxia,  )  Severe lab/radiological/exam abnormalities including:   Pleural effusion and extensive comorbidities including:   malignancy, .    That are currently affecting medical management.   I expect  patient to be hospitalized for 2 midnights requiring inpatient medical care.  Patient is at high risk for adverse outcome (such as loss of life or disability) if not treated.  Indication for inpatient stay as follows:    Hemodynamic instability despite maximal medical therapy,    inability to maintain oral hydration    Need for operative/procedural  intervention New or worsening hypoxia    Level of care     tele  For 12H    Precautions: admitted as  asymptomatic screening protocol    PPE: Used by the provider:  N 95 eye Goggles,  Gloves     Hodari Chuba 08/18/2019, 11:15 PM     [facility]     after 2 AM please page floor coverage PA If 7AM-7PM, please contact the day team taking care of the patient using [email or web address]   Patient was evaluated in the context of the global COVID-19 pandemic, which necessitated consideration that the patient might be at risk for infection with the SARS-CoV-2 virus that causes COVID-19. Institutional protocols and algorithms that pertain to the evaluation of patients at risk for COVID-19 are in a state of rapid change based on information released by regulatory bodies including the CDC and federal and state organizations. These policies and algorithms were followed during the patient's care.

## 2019-08-18 NOTE — ED Notes (Addendum)
Pt given blanket and pillow. Husband to bedside. Bed locked low. Rail up. Call bell within reach. Trying pt on RA as came from triage on 2L and states only become SOB on exertion. Pt understands that if CT angio ordered she will need an IV above the wrist. Pt agreeable to this if order is placed.

## 2019-08-18 NOTE — ED Notes (Signed)
Called CT and notified pt has 20g IV.

## 2019-08-18 NOTE — ED Notes (Signed)
Pt alert and resting calmly in bed. C/o worsening SOB for past few weeks. Reports nausea and loose BM consistent with chemo per pt. Denies fevers and CP. States only SOB upon exertion. Currently on 2L; not normally on home oxygen.

## 2019-08-19 ENCOUNTER — Encounter: Payer: Self-pay | Admitting: Internal Medicine

## 2019-08-19 ENCOUNTER — Inpatient Hospital Stay: Payer: No Typology Code available for payment source

## 2019-08-19 ENCOUNTER — Inpatient Hospital Stay: Payer: No Typology Code available for payment source | Admitting: Internal Medicine

## 2019-08-19 DIAGNOSIS — C7951 Secondary malignant neoplasm of bone: Secondary | ICD-10-CM

## 2019-08-19 DIAGNOSIS — C7989 Secondary malignant neoplasm of other specified sites: Secondary | ICD-10-CM

## 2019-08-19 DIAGNOSIS — R112 Nausea with vomiting, unspecified: Secondary | ICD-10-CM

## 2019-08-19 DIAGNOSIS — C50912 Malignant neoplasm of unspecified site of left female breast: Secondary | ICD-10-CM

## 2019-08-19 DIAGNOSIS — R188 Other ascites: Secondary | ICD-10-CM

## 2019-08-19 DIAGNOSIS — J9601 Acute respiratory failure with hypoxia: Principal | ICD-10-CM

## 2019-08-19 DIAGNOSIS — R197 Diarrhea, unspecified: Secondary | ICD-10-CM

## 2019-08-19 DIAGNOSIS — N178 Other acute kidney failure: Secondary | ICD-10-CM

## 2019-08-19 LAB — CBC WITH DIFFERENTIAL/PLATELET
Abs Immature Granulocytes: 0.11 10*3/uL — ABNORMAL HIGH (ref 0.00–0.07)
Basophils Absolute: 0.1 10*3/uL (ref 0.0–0.1)
Basophils Relative: 1 %
Eosinophils Absolute: 0.1 10*3/uL (ref 0.0–0.5)
Eosinophils Relative: 1 %
HCT: 26.6 % — ABNORMAL LOW (ref 36.0–46.0)
Hemoglobin: 8.2 g/dL — ABNORMAL LOW (ref 12.0–15.0)
Immature Granulocytes: 1 %
Lymphocytes Relative: 48 %
Lymphs Abs: 4.7 10*3/uL — ABNORMAL HIGH (ref 0.7–4.0)
MCH: 27.2 pg (ref 26.0–34.0)
MCHC: 30.8 g/dL (ref 30.0–36.0)
MCV: 88.1 fL (ref 80.0–100.0)
Monocytes Absolute: 0.6 10*3/uL (ref 0.1–1.0)
Monocytes Relative: 6 %
Neutro Abs: 4.1 10*3/uL (ref 1.7–7.7)
Neutrophils Relative %: 43 %
Platelets: 126 10*3/uL — ABNORMAL LOW (ref 150–400)
RBC: 3.02 MIL/uL — ABNORMAL LOW (ref 3.87–5.11)
RDW: 24.4 % — ABNORMAL HIGH (ref 11.5–15.5)
Smear Review: NORMAL
WBC: 9.6 10*3/uL (ref 4.0–10.5)
nRBC: 0.7 % — ABNORMAL HIGH (ref 0.0–0.2)

## 2019-08-19 LAB — BODY FLUID CELL COUNT WITH DIFFERENTIAL
Eos, Fluid: 0 %
Lymphs, Fluid: 43 %
Monocyte-Macrophage-Serous Fluid: 56 %
Neutrophil Count, Fluid: 1 %
Total Nucleated Cell Count, Fluid: 1589 cu mm

## 2019-08-19 LAB — COMPREHENSIVE METABOLIC PANEL
ALT: 41 U/L (ref 0–44)
AST: 93 U/L — ABNORMAL HIGH (ref 15–41)
Albumin: 2.4 g/dL — ABNORMAL LOW (ref 3.5–5.0)
Alkaline Phosphatase: 337 U/L — ABNORMAL HIGH (ref 38–126)
Anion gap: 10 (ref 5–15)
BUN: 24 mg/dL — ABNORMAL HIGH (ref 6–20)
CO2: 21 mmol/L — ABNORMAL LOW (ref 22–32)
Calcium: 7.5 mg/dL — ABNORMAL LOW (ref 8.9–10.3)
Chloride: 105 mmol/L (ref 98–111)
Creatinine, Ser: 1.58 mg/dL — ABNORMAL HIGH (ref 0.44–1.00)
GFR calc Af Amer: 42 mL/min — ABNORMAL LOW (ref >60)
GFR calc non Af Amer: 36 mL/min — ABNORMAL LOW (ref >60)
Glucose, Bld: 92 mg/dL (ref 70–99)
Potassium: 3 mmol/L — ABNORMAL LOW (ref 3.5–5.1)
Sodium: 136 mmol/L (ref 135–145)
Total Bilirubin: 0.8 mg/dL (ref 0.3–1.2)
Total Protein: 5.2 g/dL — ABNORMAL LOW (ref 6.5–8.1)

## 2019-08-19 LAB — TSH: TSH: 0.625 u[IU]/mL (ref 0.350–4.500)

## 2019-08-19 LAB — PHOSPHORUS: Phosphorus: 4.4 mg/dL (ref 2.5–4.6)

## 2019-08-19 LAB — LACTATE DEHYDROGENASE, PLEURAL OR PERITONEAL FLUID: LD, Fluid: 163 U/L — ABNORMAL HIGH (ref 3–23)

## 2019-08-19 LAB — OSMOLALITY: Osmolality: 281 mOsm/kg (ref 275–295)

## 2019-08-19 LAB — ALBUMIN, PLEURAL OR PERITONEAL FLUID: Albumin, Fluid: 1.2 g/dL

## 2019-08-19 LAB — PREALBUMIN: Prealbumin: 5.4 mg/dL — ABNORMAL LOW (ref 18–38)

## 2019-08-19 LAB — CK: Total CK: 43 U/L (ref 38–234)

## 2019-08-19 LAB — RETICULOCYTES
Immature Retic Fract: 34.8 % — ABNORMAL HIGH (ref 2.3–15.9)
RBC.: 3.08 MIL/uL — ABNORMAL LOW (ref 3.87–5.11)
Retic Count, Absolute: 51.7 10*3/uL (ref 19.0–186.0)
Retic Ct Pct: 1.7 % (ref 0.4–3.1)

## 2019-08-19 LAB — FOLATE: Folate: 7.3 ng/mL (ref >5.9)

## 2019-08-19 LAB — PROTEIN, PLEURAL OR PERITONEAL FLUID: Total protein, fluid: <3 g/dL

## 2019-08-19 LAB — IRON AND TIBC
Iron: 61 ug/dL (ref 28–170)
Saturation Ratios: 27 % (ref 10.4–31.8)
TIBC: 230 ug/dL — ABNORMAL LOW (ref 250–450)
UIBC: 169 ug/dL

## 2019-08-19 LAB — PATHOLOGIST SMEAR REVIEW

## 2019-08-19 LAB — GLUCOSE, PLEURAL OR PERITONEAL FLUID: Glucose, Fluid: 83 mg/dL

## 2019-08-19 LAB — LACTIC ACID, PLASMA: Lactic Acid, Venous: 0.7 mmol/L (ref 0.5–1.9)

## 2019-08-19 LAB — MAGNESIUM: Magnesium: 2.2 mg/dL (ref 1.7–2.4)

## 2019-08-19 LAB — FERRITIN: Ferritin: 1348 ng/mL — ABNORMAL HIGH (ref 11–307)

## 2019-08-19 LAB — VITAMIN B12: Vitamin B-12: 1677 pg/mL — ABNORMAL HIGH (ref 180–914)

## 2019-08-19 MED ORDER — POTASSIUM CHLORIDE 10 MEQ/100ML IV SOLN
10.0000 meq | INTRAVENOUS | Status: AC
  Administered 2019-08-19 (×3): 10 meq via INTRAVENOUS
  Filled 2019-08-19 (×3): qty 100

## 2019-08-19 MED ORDER — ACETAMINOPHEN 650 MG RE SUPP: 650.0000 mg | Freq: Four times a day (QID) | RECTAL | Status: DC | PRN

## 2019-08-19 MED ORDER — SODIUM CHLORIDE 0.9% FLUSH
3.0000 mL | Freq: Two times a day (BID) | INTRAVENOUS | Status: DC
  Administered 2019-08-20 – 2019-08-21 (×2): 3 mL via INTRAVENOUS

## 2019-08-19 MED ORDER — SODIUM CHLORIDE 0.9% FLUSH
3.0000 mL | Freq: Two times a day (BID) | INTRAVENOUS | Status: DC
  Administered 2019-08-19 – 2019-08-21 (×4): 3 mL via INTRAVENOUS

## 2019-08-19 MED ORDER — DOCUSATE SODIUM 100 MG PO CAPS
100.0000 mg | ORAL_CAPSULE | Freq: Two times a day (BID) | ORAL | Status: DC
  Filled 2019-08-19 (×4): qty 1

## 2019-08-19 MED ORDER — ONDANSETRON HCL 4 MG PO TABS: 4.0000 mg | ORAL_TABLET | Freq: Four times a day (QID) | ORAL | Status: DC | PRN

## 2019-08-19 MED ORDER — SODIUM CHLORIDE 0.9 % IV SOLN: 250.0000 mL | INTRAVENOUS | Status: DC | PRN

## 2019-08-19 MED ORDER — ALPRAZOLAM 1 MG PO TABS
1.0000 mg | ORAL_TABLET | Freq: Every evening | ORAL | Status: DC | PRN
  Administered 2019-08-20: 22:00:00 1 mg via ORAL
  Filled 2019-08-19: qty 1

## 2019-08-19 MED ORDER — HYDROCODONE-ACETAMINOPHEN 5-325 MG PO TABS: 1.0000 | ORAL_TABLET | ORAL | Status: DC | PRN

## 2019-08-19 MED ORDER — POTASSIUM CHLORIDE IN NACL 20-0.9 MEQ/L-% IV SOLN
INTRAVENOUS | Status: DC
  Filled 2019-08-19 (×2): qty 1000

## 2019-08-19 MED ORDER — PAROXETINE HCL 20 MG PO TABS
20.0000 mg | ORAL_TABLET | Freq: Every day | ORAL | Status: DC
  Administered 2019-08-20 – 2019-08-21 (×2): 20 mg via ORAL
  Filled 2019-08-19 (×3): qty 1

## 2019-08-19 MED ORDER — PANTOPRAZOLE SODIUM 40 MG PO TBEC
40.0000 mg | DELAYED_RELEASE_TABLET | Freq: Every day | ORAL | Status: DC
  Administered 2019-08-20 – 2019-08-21 (×2): 40 mg via ORAL
  Filled 2019-08-19 (×2): qty 1

## 2019-08-19 MED ORDER — ONDANSETRON HCL 4 MG/2ML IJ SOLN: 4.0000 mg | Freq: Four times a day (QID) | INTRAMUSCULAR | Status: DC | PRN

## 2019-08-19 MED ORDER — TRIMETHOPRIM 100 MG PO TABS
100.0000 mg | ORAL_TABLET | Freq: Every day | ORAL | Status: DC
  Administered 2019-08-21: 100 mg via ORAL
  Filled 2019-08-19 (×2): qty 1

## 2019-08-19 MED ORDER — ACETAMINOPHEN 325 MG PO TABS: 650.0000 mg | ORAL_TABLET | Freq: Four times a day (QID) | ORAL | Status: DC | PRN

## 2019-08-19 MED ORDER — SODIUM CHLORIDE 0.9% FLUSH: 3.0000 mL | INTRAVENOUS | Status: DC | PRN

## 2019-08-19 MED ORDER — PROCHLORPERAZINE MALEATE 10 MG PO TABS
10.0000 mg | ORAL_TABLET | Freq: Four times a day (QID) | ORAL | Status: DC | PRN
  Filled 2019-08-19: qty 1

## 2019-08-19 NOTE — Consult Note (Signed)
[place] NOTE  Patient Care Team: Malfi, Lupita Raider, FNP as PCP - General (Family Medicine) Cammie Sickle, MD as Consulting Physician (Internal Medicine) Lucilla Lame, MD as Consulting Physician (Gastroenterology) Hollice Espy, MD as Consulting Physician (Urology) Alfonzo Feller, RN as [place] Management  CHIEF COMPLAINTS/PURPOSE OF CONSULTATION: breast cancer/ shortness of breath  HISTORY OF PRESENTING ILLNESS:  Natasha Dennis 54 y.o.  female history of metastatic invasive lobular breast cancer-bone marrow/bone GI tract is currently admitted to hospital for worsening shortness of breath/acute renal failure.  Patient currently on first-line therapy with abemaciclib plus Faslodex.  Patient noted to have worsening nausea vomiting diarrhea for the last 2 to 3 weeks.  Patient having episodes of generalized weakness; and poor p.o. intake.  She has lost weight. Also complains of shortness of breath on exertion.  No worsening cough with no fevers.  Patient was evaluated in the cancer center-noted to have acute renal failure with a creatinine of 1.5; potassium 2.8.  She was also newly hypoxic with saturation up to 77 on room air with exertion.  On evaluation emergency room with a CTA-showed no evidence of PE but showed moderate right pleural effusion; interstitial thickening trace of edema.  Overnight patient had no acute events.    Review of Systems  Constitutional: Positive for malaise/fatigue and weight loss. Negative for chills, diaphoresis and fever.  HENT: Negative for nosebleeds and sore throat.   Eyes: Negative for double vision.  Respiratory: Negative for cough, hemoptysis, sputum production, shortness of breath and wheezing.   Cardiovascular: Negative for chest pain, palpitations, orthopnea and leg swelling.  Gastrointestinal: Positive for abdominal pain, diarrhea, nausea and vomiting. Negative for blood in stool,  constipation, heartburn and melena.  Genitourinary: Negative for dysuria, frequency and urgency.  Musculoskeletal: Positive for back pain and joint pain.  Skin: Negative.  Negative for itching and rash.  Neurological: Positive for dizziness. Negative for tingling, focal weakness, weakness and headaches.  Endo/Heme/Allergies: Does not bruise/bleed easily.  Psychiatric/Behavioral: Negative for depression. The patient is not nervous/anxious and does not have insomnia.      MEDICAL HISTORY:  Past Medical History:  Diagnosis Date  . Breast cancer ([place]) 03-17-2009   lt Mastectomy and chemo  . Cancer ([place])   . GERD (gastroesophageal reflux disease)   . History of bone marrow biopsy 06/03/2019  . Neoplastic malignant related fatigue     SURGICAL HISTORY: Past Surgical History:  Procedure Laterality Date  . ABDOMINAL HYSTERECTOMY    . AUGMENTATION MAMMAPLASTY Right    RT BREAST IMPLANT ONLY  . ESOPHAGOGASTRODUODENOSCOPY (EGD) WITH PROPOFOL N/A 07/17/2019   Procedure: ESOPHAGOGASTRODUODENOSCOPY (EGD) WITH PROPOFOL;  Surgeon: Lucilla Lame, MD;  Location: [facility] ENDOSCOPY;  Service: Endoscopy;  Laterality: N/A;  . MASTECTOMY Left 2011  . OOPHORECTOMY      SOCIAL HISTORY: Social History   Socioeconomic History  . Marital status: Married    Spouse name: Not on file  . Number of children: Not on file  . Years of education: Not on file  . Highest education level: Not on file  Occupational History  . Not on file  Tobacco Use  . Smoking status: Former Smoker    Quit date: 03/05/1984    Years since quitting: 35.4  . Smokeless tobacco: Never Used  Vaping Use  . Vaping Use: Never used  Substance and Sexual Activity  . Alcohol use: No  . Drug use: No  . Sexual activity: Not on file  Other Topics Concern  . Not on file  Social History Narrative  . Not on file   Social Determinants of Health   Financial Resource Strain:   . Difficulty of Paying Living Expenses:   Food Insecurity:    . Worried About Charity fundraiser in the Last Year:   . Arboriculturist in the Last Year:   Transportation Needs:   . Film/video editor (Medical):   Marland Kitchen Lack of Transportation (Non-Medical):   Physical Activity:   . Days of Exercise per Week:   . Minutes of Exercise per Session:   Stress:   . Feeling of Stress :   Social Connections:   . Frequency of Communication with Friends and Family:   . Frequency of Social Gatherings with Friends and Family:   . Attends Religious Services:   . Active Member of Clubs or Organizations:   . Attends Archivist Meetings:   Marland Kitchen Marital Status:   Intimate Partner Violence:   . Fear of Current or Ex-Partner:   . Emotionally Abused:   Marland Kitchen Physically Abused:   . Sexually Abused:     FAMILY HISTORY: Family History  Problem Relation Age of Onset  . Diabetes Mother   . Arthritis Mother   . Heart disease Father   . Breast cancer Neg Hx     ALLERGIES:  is allergic to influenza vaccines.  MEDICATIONS:  Current Facility-Administered Medications  Medication Dose Route Frequency Provider Last Rate Last Admin  . 0.9 %  sodium chloride infusion  250 mL Intravenous PRN Doutova, Anastassia, MD      . 0.9 % NaCl with KCl 20 mEq/ L  infusion   Intravenous Continuous Sharen Hones, MD 75 mL/hr at 08/19/19 1806 New Bag at 08/19/19 1806  . acetaminophen (TYLENOL) tablet 650 mg  650 mg Oral Q6H PRN Toy Baker, MD       Or  . acetaminophen (TYLENOL) suppository 650 mg  650 mg Rectal Q6H PRN Doutova, Anastassia, MD      . ALPRAZolam Duanne Moron) tablet 1 mg  1 mg Oral QHS PRN Doutova, Anastassia, MD      . docusate sodium (COLACE) capsule 100 mg  100 mg Oral BID Doutova, Anastassia, MD      . HYDROcodone-acetaminophen (NORCO/VICODIN) 5-325 MG per tablet 1-2 tablet  1-2 tablet Oral Q4H PRN Doutova, Anastassia, MD      . ondansetron (ZOFRAN) tablet 4 mg  4 mg Oral Q6H PRN Doutova, Anastassia, MD       Or  . ondansetron (ZOFRAN) injection 4 mg  4  mg Intravenous Q6H PRN Doutova, Anastassia, MD      . pantoprazole (PROTONIX) EC tablet 40 mg  40 mg Oral Daily Doutova, Anastassia, MD      . PARoxetine (PAXIL) tablet 20 mg  20 mg Oral Daily Doutova, Anastassia, MD      . prochlorperazine (COMPAZINE) tablet 10 mg  10 mg Oral Q6H PRN Doutova, Anastassia, MD      . sodium chloride flush (NS) 0.9 % injection 3 mL  3 mL Intravenous Q12H Doutova, Anastassia, MD      . sodium chloride flush (NS) 0.9 % injection 3 mL  3 mL Intravenous Q12H Doutova, Anastassia, MD      . sodium chloride flush (NS) 0.9 % injection 3 mL  3 mL Intravenous PRN Doutova, Anastassia, MD      . trimethoprim (TRIMPEX) tablet 100 mg  100 mg Oral Daily Toy Baker, MD          .  PHYSICAL EXAMINATION:  Vitals:   08/19/19 1749 08/19/19 2028  BP: 120/75 114/78  Pulse: 99 95  Resp: 18 18  Temp: 97.7 F (36.5 C) 98.4 F (36.9 C)  SpO2: 100% 100%   Filed Weights   08/18/19 1453  Weight: 174 lb 2.6 oz (79 kg)    Physical Exam HENT:     Head: Normocephalic and atraumatic.     Mouth/Throat:     Pharynx: No oropharyngeal exudate.  Eyes:     Pupils: Pupils are equal, round, and reactive to light.  Cardiovascular:     Rate and Rhythm: Normal rate and regular rhythm.  Pulmonary:     Effort: No respiratory distress.     Breath sounds: Examination of the right-middle field reveals decreased breath sounds. Examination of the right-lower field reveals decreased breath sounds. Decreased breath sounds present. No wheezing.  Abdominal:     General: Bowel sounds are normal. There is no distension.     Palpations: Abdomen is soft. There is no mass.     Tenderness: There is no abdominal tenderness. There is no guarding or rebound.  Musculoskeletal:        General: No tenderness. Normal range of motion.     Cervical back: Normal range of motion and neck supple.  Skin:    General: Skin is warm.  Neurological:     Mental Status: She is alert and oriented to person,  place, and time.  Psychiatric:        Mood and Affect: Affect normal.      LABORATORY DATA:  I have reviewed the data as listed Lab Results  Component Value Date   WBC 9.6 08/19/2019   HGB 8.2 (L) 08/19/2019   HCT 26.6 (L) 08/19/2019   MCV 88.1 08/19/2019   PLT 126 (L) 08/19/2019   Recent Labs    08/04/19 1308 08/18/19 1038 08/19/19 0611  NA 140 131* 136  K 4.0 2.8* 3.0*  CL 109 100 105  CO2 23 20* 21*  GLUCOSE 107* 120* 92  BUN 15 25* 24*  CREATININE 0.88 1.53* 1.58*  CALCIUM 7.9* 7.3* 7.5*  GFRNONAA >60 38* 36*  GFRAA >60 44* 42*  PROT 5.9* 5.9* 5.2*  ALBUMIN 2.9* 2.6* 2.4*  AST 40 88* 93*  ALT 18 38 41  ALKPHOS 353* 359* 337*  BILITOT 0.6 0.8 0.8    RADIOGRAPHIC STUDIES: I have personally reviewed the radiological images as listed and agreed with the findings in the report. DG Chest 2 View  Result Date: 08/18/2019 CLINICAL DATA:  Dyspnea on exertion for 2 weeks, history of metastatic breast cancer EXAM: CHEST - 2 VIEW COMPARISON:  06/10/2019 FINDINGS: Frontal and lateral views of the chest demonstrate extensive diffuse sclerotic bony metastases unchanged since recent chest CT. Minimal blunting of the right costophrenic angle may reflect small effusion. No airspace disease or pneumothorax. Cardiac silhouette is unremarkable. IMPRESSION: 1. Trace right pleural effusion. 2. Extensive sclerotic bony metastases. Electronically Signed   By: Randa Ngo M.D.   On: 08/18/2019 15:38   CT Head Wo Contrast  Result Date: 08/18/2019 CLINICAL DATA:  Headache history of metastatic breast cancer EXAM: CT HEAD WITHOUT CONTRAST TECHNIQUE: Contiguous axial images were obtained from the base of the skull through the vertex without intravenous contrast. COMPARISON:  CT brain 11/18/2010 FINDINGS: Brain: No acute territorial infarction, hemorrhage or intracranial mass. The ventricles are nonenlarged. Small chronic infarct in the right cerebellum. Stable ventricle size. Vascular: No  hyperdense vessels.  No unexpected calcification  Skull: Normal. Negative for fracture or focal lesion. Sinuses/Orbits: No acute finding. Other: None IMPRESSION: Negative.  No CT evidence for acute intracranial abnormality Electronically Signed   By: Donavan Foil M.D.   On: 08/18/2019 20:11   CT Angio Chest PE W and/or Wo Contrast  Result Date: 08/18/2019 CLINICAL DATA:  Shortness of breath history of breast cancer EXAM: CT ANGIOGRAPHY CHEST WITH CONTRAST TECHNIQUE: Multidetector CT imaging of the chest was performed using the standard protocol during bolus administration of intravenous contrast. Multiplanar CT image reconstructions and MIPs were obtained to evaluate the vascular anatomy. CONTRAST:  45m OMNIPAQUE IOHEXOL 350 MG/ML SOLN COMPARISON:  Chest x-ray 08/18/2019, CT chest 06/10/2019, PET CT 06/18/2018 FINDINGS: Cardiovascular: Satisfactory opacification of the pulmonary arteries to the segmental level. No evidence of pulmonary embolism. Nonaneurysmal aorta. No dissection. Cardiac size within normal limits. Small pericardial effusion. Mediastinum/Nodes: Midline trachea. No thyroid mass. Left axillary adenopathy with nodes measuring up to 16 mm. Esophagus within normal limits Lungs/Pleura: Moderate right pleural effusion. Patchy bilateral ground-glass density and septal thickening suspect for edema. Upper Abdomen: No acute abnormality.  Small amount of ascites Musculoskeletal: Post mastectomy and reconstruction changes on the left. Right breast implant. Widespread sclerosis involving the shoulder girdles, sternum, ribs and spine consistent with skeletal metastatic disease, progression since 06/10/2019 comparison CT Review of the MIP images confirms the above findings. IMPRESSION: 1. Negative for acute pulmonary embolus or aortic dissection. 2. Moderate right pleural effusion.  Small pericardial effusion. 3. Hazy bilateral lung densities with septal thickening suggestive of edema 4. Mild left axillary  adenopathy consistent with metastatic disease. Widespread skeletal sclerosis consistent with metastatic disease, progressed as compared with 06/10/2019. 5. Small amount of upper abdominal ascites Electronically Signed   By: KDonavan FoilM.D.   On: 08/18/2019 20:20   DG Chest Port 1 View  Result Date: 08/19/2019 CLINICAL DATA:  Breast cancer. Mastectomy. Ex-smoker. Status post thoracentesis. EXAM: PORTABLE CHEST 1 VIEW COMPARISON:  08/18/2019 plain film and CTA. FINDINGS: Widespread sclerotic osseous metastasis. Midline trachea. Normal heart size and mediastinal contours. No pleural effusion or pneumothorax. No residual right-sided pleural effusion. Smooth septal thickening. No lobar consolidation. IMPRESSION: 1. No residual right-sided pleural effusion. No pneumothorax. 2. Interstitial thickening, likely related to mild pulmonary venous congestion. Electronically Signed   By: KAbigail MiyamotoM.D.   On: 08/19/2019 16:22   [place]domen Limited RUQ  Result Date: 08/19/2019 CLINICAL DATA:  Elevated LFTs EXAM: ULTRASOUND ABDOMEN LIMITED RIGHT UPPER QUADRANT COMPARISON:  None. FINDINGS: Gallbladder: Gallbladder sludge and layering small stones are seen. Gallbladder wall is 3.3 mm. No sonographic Murphy sign noted by sonographer. Common bile duct: Diameter: 5.2 mm Liver: No focal lesion identified. Within normal limits in parenchymal echogenicity. Portal vein is patent on color Doppler imaging with normal direction of blood flow towards the liver. Other: Incidentally noted is a small right pleural effusion. There is a small amount of perihepatic ascites. There is also mild right pelvicaliectasis present. IMPRESSION: Sludge/gallbladder stones.  No evidence of acute cholecystitis. Small amount of perihepatic ascites and right pleural effusion Mild right hydronephrosis. Electronically Signed   By: BPrudencio PairM.D.   On: 08/19/2019 00:15   [place]ORACENTESIS ASP PLEURAL SPACE W/IMG GUIDE  Result Date:  08/19/2019 INDICATION: 54year old female with small right-sided pleural effusion, history of lobular breast carcinoma EXAM: ULTRASOUND GUIDED RIGHT THORACENTESIS MEDICATIONS: None. COMPLICATIONS: None immediate. PROCEDURE: An ultrasound guided thoracentesis was thoroughly discussed with the patient and questions answered. The benefits, risks, alternatives and complications were  also discussed. The patient understands and wishes to proceed with the procedure. Written consent was obtained. Ultrasound was performed to localize and mark an adequate pocket of fluid in the right chest. The area was then prepped and draped in the normal sterile fashion. 1% Lidocaine was used for local anesthesia. Under ultrasound guidance a 6 Fr Safe-T-Centesis catheter was introduced. Thoracentesis was performed. The catheter was removed and a dressing applied. FINDINGS: A total of approximately 400 mL of amber colored fluid was removed. Samples were sent to the laboratory as requested by the clinical team. IMPRESSION: Successful ultrasound guided right thoracentesis yielding 400 mL of pleural fluid. Electronically Signed   By: Jacqulynn Cadet M.D.   On: 08/19/2019 16:35    Primary cancer of left breast with metastasis to other site [facility]) #54 year old female patient with a history of Metastatic breast carcinoma-involving the bone marrow/bones/GI tract involvement-is currently admitted to hospital for worsening shortness of breath/nausea vomiting diarrhea/acute renal failure  #Acute hypoxic respiratory failure-likely secondary to progressive right pleural effusion/question lymphangitic spread; clinically suspicious for malignant effusion.  CT scan negative for PE.  #Metastatic breast cancer ER/PR positive-bone marrow/bones/GI involvement- currently on Faslodex; Started Abema 150 mg BID appx 3 week ago.   #Nausea vomiting diarrhea-likely secondary to abemaciclib.    #Recommendations:   #Agree with thoracentesis; highly  suspicious for malignant effusion.  #Acute renal failure secondary-prerenal/nausea vomiting diarrhea from abemaciclib.  Hold abemaciclib for now.  Recommend IV fluids given the exposure to contrast.  #CODE STATUS DNR/DNI  # Thank you Dr.Zhang for allowing me to participate in the care of your pleasant patient. Please do not hesitate to contact me with questions or concerns in the interim.  Discussed with Dr. Roosevelt Locks.  Discussed with the patient's husband by the bedside.  Also left a message for the patient's daughter Crystal to discuss above.   # I reviewed the blood work- with the patient in detail; also reviewed the imaging independently [as summarized above]; and with the patient in detail.   All questions were answered. The patient knows to call the clinic with any problems, questions or concerns.    Cammie Sickle, MD 08/19/2019 9:40 PM

## 2019-08-19 NOTE — Evaluation (Signed)
Physical Therapy Evaluation Patient Details Name: TERRYANN VERBEEK MRN: [id number] DOB: May 15, 1965 Today's Date: 08/19/2019   History of Present Illness  Patient is a pleasant 54 year old female who came to the ED from oncologist when she was found to be tachycardic and desaturating. PMh includes metastatic breast cancer with mets to bone marrow, GERD, thrombocytopenia, pleural effusion, anemia associated with chemotherapy, dehydration, AKI, hypomagnesemia, . Was hypoxic with CT scan showing moderate righ tside pleural effusion and acute kdiney injury with hypokalemia,  Clinical Impression  Patient is a very pleasant 54 year old female with generalized weakness secondary to stage IV cancer. Patient tolerated ambulation without oxygen with Sp02 maintaining 89-93% saturation however upon seated after ambulation her SP02 dropped to 84%, oxygen placed back on patient (2L via nasal cannula) and Sp02 raised back to 94% once returned to supine position. Patient tolerated short duration ambulation however did display diffuse weakness. Patient would benefit from skilled physical therapy to improve strength and capacity for functional mobility. Upon discharge patient would benefit from home health physical therapy to address deficits of strength.     Follow Up Recommendations Home health PT    Equipment Recommendations  None recommended by PT    Recommendations for Other Services       Precautions / Restrictions Precautions Precautions: None Restrictions Weight Bearing Restrictions: No      Mobility  Bed Mobility Overal bed mobility: Independent             General bed mobility comments: supine to sit and sit to supine ind with no need for increase time or UE support  Transfers Overall transfer level: Independent               General transfer comment: no AD required, fatigued but able to transfer without LOB or assistance  Ambulation/Gait Ambulation/Gait assistance: Min  guard Gait Distance (Feet): 30 Feet Assistive device: None Gait Pattern/deviations: Step-through pattern;Narrow base of support;Decreased stride length Gait velocity: decreased   General Gait Details: Sp02 dropped to 89 on room air with ambulation.  Stairs            Wheelchair Mobility    Modified Rankin (Stroke Patients Only)       Balance Overall balance assessment: Modified Independent (patient able to maintain seated and standing stability against resistance)                                           Pertinent Vitals/Pain Pain Assessment: No/denies pain    Home Living Family/patient expects to be discharged to:: Private residence Living Arrangements: Spouse/significant other;Children (daughter came to help) Available Help at Discharge: Family Type of Home: House Home Access: Stairs to enter Entrance Stairs-Rails: Psychiatric nurse of Steps: 3 Home Layout: One level Home Equipment: Grab bars - tub/shower Additional Comments: Patient reports she has no equipment at home. Is not on oxygen at home, does not use an AD. Lives with husband and daughter has recently come to stay and help out.    Prior Function Level of Independence: Independent         Comments: Is indep with ADLs and iADLS but does need extra time due to fatigue from cancer diagnosis.     Hand Dominance        Extremity/Trunk Assessment   Upper Extremity Assessment Upper Extremity Assessment: Defer to OT evaluation    Lower Extremity  Assessment Lower Extremity Assessment: Overall WFL for tasks assessed (Grossly 4-/5 bilaterally however does have limited capacity for prolonged resistance)    Cervical / Trunk Assessment Cervical / Trunk Assessment: Normal  Communication   Communication: No difficulties  Cognition Arousal/Alertness: Awake/alert Behavior During Therapy: WFL for tasks assessed/performed Overall Cognitive Status: Within Functional Limits  for tasks assessed                                        General Comments General comments (skin integrity, edema, etc.): patient apears fatigued    Exercises Other Exercises Other Exercises: Patient educated on safe transfers and mobility, cues for breathing technique for improved Sp02 levels with patient demonstrating understanding.   Assessment/Plan    PT Assessment Patient needs continued PT services  PT Problem List Decreased strength;Decreased activity tolerance;Decreased mobility;Cardiopulmonary status limiting activity       PT Treatment Interventions Gait training;Stair training;Functional mobility training;Therapeutic activities;Therapeutic exercise;Patient/family education;Neuromuscular re-education;Balance training    PT Goals (Current goals can be found in the Care Plan section)  Acute Rehab PT Goals Patient Stated Goal: to return home PT Goal Formulation: With patient Time For Goal Achievement: 09/02/19 Potential to Achieve Goals: Fair    Frequency Min 2X/week   Barriers to discharge Other (comment) would benefit from HHPT    Co-evaluation               AM-PAC PT "6 Clicks" Mobility  Outcome Measure Help needed turning from your back to your side while in a flat bed without using bedrails?: None Help needed moving from lying on your back to sitting on the side of a flat bed without using bedrails?: None Help needed moving to and from a bed to a chair (including a wheelchair)?: None Help needed standing up from a chair using your arms (e.g., wheelchair or bedside chair)?: None Help needed to walk in hospital room?: A Little Help needed climbing 3-5 steps with a railing? : A Little 6 Click Score: 22    End of Session Equipment Utilized During Treatment: Gait belt;Oxygen (2L via nasal cannula at end of session due to desat to 84% seated) Activity Tolerance: Patient tolerated treatment well Patient left: in bed;with call bell/phone  within reach Nurse Communication: Mobility status (patient's oxygen saturation levels) PT Visit Diagnosis: Other abnormalities of gait and mobility (R26.89);Muscle weakness (generalized) (M62.81)    Time: 0950-1008 PT Time Calculation (min) (ACUTE ONLY): 18 min   Charges:   PT Evaluation $PT Eval Low Complexity: 1 Low        Janna Arch, PT, DPT    08/19/2019, 11:54 AM

## 2019-08-19 NOTE — TOC Initial Note (Signed)
Transition of Care [facility]) - Initial/Assessment Note    Patient Details  Name: Natasha Dennis MRN: [id number] Date of Birth: 1965/12/16  Transition of Care [facility]) CM/SW Contact:    Anselm Pancoast, RN Phone Number: 08/19/2019, 3:05 PM  Clinical Narrative:                 Spoke with spouse and discussed potential needs including home health. Family does not have a preference on agencies. Spouse states patient was independent until a week ago and was driving. No assistance needed with ADL's prior to this acute event. Son lives with patient and daughter, Blanchard Kelch is very involved with care. Spouse, Butch, is requesting calls be made to daughter instead of himself. Family concerned about patient being in emergency department for extended amount of time waiting on bed. RN CM acknowledge concerns and offered to try and get patient on hospital room bed. Family very appreciative of assistance. No other needs at this time.   Expected Discharge Plan: [place] Barriers to Discharge: Continued Medical Work up   Patient Goals and CMS Choice Patient states their goals for this hospitalization and ongoing recovery are:: Get feeling better and get back home      Expected Discharge Plan and Services Expected Discharge Plan: [place] Choice: [place] arrangements for the past 2 months: Single Family Home                                      Prior Living Arrangements/Services Living arrangements for the past 2 months: Single Family Home Lives with:: Spouse, Adult Children Patient language and need for interpreter reviewed:: Yes Do you feel safe going back to the place where you live?: Yes      Need for Family Participation in Patient Care: Yes (Comment) Care giver support system in place?: Yes (comment)   Criminal Activity/Legal Involvement Pertinent to Current Situation/Hospitalization: No - Comment as  needed  Activities of Daily Living      Permission Sought/Granted Permission sought to share information with : Case Manager, Customer service manager Permission granted to share information with : Yes, Verbal Permission Granted  Share Information with NAME: [facility] department/Home Health agencies           Emotional Assessment Appearance:: Appears stated age Attitude/Demeanor/Rapport: Engaged Affect (typically observed): Accepting Orientation: : Oriented to Self, Oriented to Place, Oriented to  Time, Oriented to Situation Alcohol / Substance Use: Never Used Psych Involvement: No (comment)  Admission diagnosis:  Acute respiratory failure with hypoxia ([place]) [J96.01] Patient Active Problem List   Diagnosis Date Noted  . Pleural effusion 08/18/2019  . Anemia associated with chemotherapy 08/18/2019  . AKI (acute kidney injury) ([place]) 08/18/2019  . Dehydration 08/18/2019  . Hypokalemia 08/18/2019  . Hypomagnesemia 08/18/2019  . Acute respiratory failure with hypoxia ([place]) 08/18/2019  . Early satiety   . Heartburn   . Gastritis without bleeding   . Goals of care, counseling/discussion 06/09/2019  . Cancer, metastatic to bone ([place]) 06/09/2019  . Thrombocytopenia ([place]) 12/19/2018  . Primary osteoarthritis of right shoulder 10/15/2018  . Arthritis of right glenohumeral joint 10/15/2018  . Chronic right shoulder pain 10/15/2018  . Anxiety 04/21/2018  . Fungal nail infection 04/21/2018  . History of left breast cancer 02/01/2016  . BMI 33.0-33.9,adult 11/29/2015  . Primary cancer of left breast  with metastasis to other site [facility]) 09/28/2015  . Osteopenia 09/28/2015  . GERD (gastroesophageal reflux disease) 09/16/2014   PCP:  Verl Bangs, FNP Pharmacy:   [facility], [place] [address] Phone: [phone] Fax: [phone]  [id number] [place], [place] [address] [place] [address] Phone: [phone] Fax: [place], [place] [address] Phone: [phone] Fax: [phone]     Social Determinants of Health (SDOH) Interventions    Readmission Risk Interventions No flowsheet data found.

## 2019-08-19 NOTE — Progress Notes (Signed)
PROGRESS NOTE    Natasha Dennis  YIF:[id number] DOB: Jul 28, 1965 DOA: 08/18/2019 PCP: Verl Bangs, FNP   Chief complaint: Nausea, vomiting, shortness of breath  Brief Narrative: Patient is a 54 year old female with history of metastatic breast cancer who present to the hospital with nausea vomiting and diarrhea.  She was recently seen by oncology, her abemaciclib was discontinued 2 days ago.  Diarrhea has been better since came to the emergency room. She also had some hypoxemia with short of breath.  Chest CT scan showed moderate right-sided pleural effusion.  She had acute kidney injury with hypokalemia.  She received fluids and potassium supplement.    Assessment & Plan:   Active Problems:   GERD (gastroesophageal reflux disease)   Primary cancer of left breast with metastasis to other site (HCC)   Thrombocytopenia (HCC)   Pleural effusion   Anemia associated with chemotherapy   AKI (acute kidney injury) ([place])   Dehydration   Hypokalemia   Hypomagnesemia   Acute respiratory failure with hypoxia ([place])  #1.  Acute respite failure with hypoxemia.   Most likely due to pleural effusion.  No evidence of pneumonia.  BNP 268.  No evidence of volume overload.  She has scheduled thoracentesis today.  We will continue to monitor.  2.  Acute kidney injury secondary to dehydration. This is secondary to recent nausea vomiting diarrhea.  GI symptom is better today.  No additional diarrhea.  Due to persistent renal insufficiency, she will be given IV fluids.  3.  Hypokalemia. No hypomagnesemia.  Continue IV supplement.  4.  Liver function changes. Right upper quadrant ultrasound did not show any evidence of cholecystitis.  Did not report any evidence liver metastasis.  5.  Thrombocytopenia with anemia. Most likely due to cancer treatment.  Will follow closely.  Not currently on heparin.  6.  Metastatic breast cancer. Discussed with oncology.    DVT prophylaxis: SCds Code  Status:DNR Family Communication: Husband at bedside. Disposition Plan:  . Patient came from: Home            . Anticipated d/c place: Home . Barriers to d/c OR conditions which need to be met to effect a safe d/c:   Consultants:   Oncology  Procedures: Thoracentesis. Antimicrobials:  None  Subjective: Patient still has some nausea, no vomiting.  No additional diarrhea since admission. Short of breath still present, on 2 L oxygen.  No cough. No fever or chills.  Objective: Vitals:   08/18/19 2120 08/18/19 2230 08/18/19 2300 08/19/19 0600  BP:  117/71 130/78 108/62  Pulse: 98 97 100 98  Resp: _0 Temp:    97.6 F (36.4 C)  TempSrc:    Oral  SpO2: 99% 98% 98% 97%  Weight:      Height:        Intake/Output Summary (Last 24 hours) at 08/19/2019 1046 Last data filed at 08/18/2019 2200 Gross per 24 hour  Intake --  Output 500 ml  Net -500 ml   Filed Weights   08/18/19 1453  Weight: 79 kg    Examination:  General exam: Appears calm and comfortable  Respiratory system: Decreased breathing sounds on the right side.Marland Kitchen Respiratory effort normal. Cardiovascular system: S1 & S2 heard, RRR. No JVD, murmurs, rubs, gallops or clicks. No pedal edema. Gastrointestinal system: Abdomen is nondistended, soft and nontender. No organomegaly or masses felt. Normal bowel sounds heard. Central nervous system: Alert and oriented. No focal neurological deficits. Extremities: Symmetric  Skin:  No rashes, lesions or ulcers Psychiatry: Judgement and insight appear normal. Mood & affect appropriate.     Data Reviewed: I have personally reviewed following labs and imaging studies  CBC: Recent Labs  Lab 08/18/19 1038 08/19/19 0611  WBC 10.2 9.6  NEUTROABS 4.1 4.1  HGB 8.9* 8.2*  HCT 28.6* 26.6*  MCV 87.2 88.1  PLT 131* 263*   Basic Metabolic Panel: Recent Labs  Lab 08/18/19 1038 08/19/19 0611  NA 131* 136  K 2.8* 3.0*  CL 100 105  CO2 20* 21*  GLUCOSE 120* 92   BUN 25* 24*  CREATININE 1.53* 1.58*  CALCIUM 7.3* 7.5*  MG 2.1 2.2  PHOS  --  4.4   GFR: Estimated Creatinine Clearance: 42.7 mL/min (A) (by C-G formula based on SCr of 1.58 mg/dL (H)). Liver Function Tests: Recent Labs  Lab 08/18/19 1038 08/19/19 0611  AST 88* 93*  ALT 38 41  ALKPHOS 359* 337*  BILITOT 0.8 0.8  PROT 5.9* 5.2*  ALBUMIN 2.6* 2.4*   No results for input(s): LIPASE, AMYLASE in the last 168 hours. No results for input(s): AMMONIA in the last 168 hours. Coagulation Profile: No results for input(s): INR, PROTIME in the last 168 hours. Cardiac Enzymes: Recent Labs  Lab 08/19/19 0611  CKTOTAL 43   BNP (last 3 results) No results for input(s): PROBNP in the last 8760 hours. HbA1C: No results for input(s): HGBA1C in the last 72 hours. CBG: No results for input(s): GLUCAP in the last 168 hours. Lipid Profile: No results for input(s): CHOL, HDL, LDLCALC, TRIG, CHOLHDL, LDLDIRECT in the last 72 hours. Thyroid Function Tests: Recent Labs    08/19/19 0611  TSH 0.625   Anemia Panel: Recent Labs    08/19/19 0611  FOLATE 7.3  FERRITIN 1,348*  TIBC 230*  IRON 61  RETICCTPCT 1.7   Sepsis Labs: Recent Labs  Lab 08/19/19 3354  LATICACIDVEN 0.7    Recent Results (from the past 240 hour(s))  SARS Coronavirus 2 by RT PCR (hospital order, performed in [facility] hospital lab) Nasopharyngeal Nasopharyngeal Swab     Status: None   Collection Time: 08/18/19  8:45 PM   Specimen: Nasopharyngeal Swab  Result Value Ref Range Status   SARS Coronavirus 2 NEGATIVE NEGATIVE Final    Comment: (NOTE) SARS-CoV-2 target nucleic acids are NOT DETECTED.  The SARS-CoV-2 RNA is generally detectable in upper and lower respiratory specimens during the acute phase of infection. The lowest concentration of SARS-CoV-2 viral copies this assay can detect is 250 copies / mL. A negative result does not preclude SARS-CoV-2 infection and should not be used as the sole basis for  treatment or other patient management decisions.  A negative result may occur with improper specimen collection / handling, submission of specimen other than nasopharyngeal swab, presence of viral mutation(s) within the areas targeted by this assay, and inadequate number of viral copies (<250 copies / mL). A negative result must be combined with clinical observations, patient history, and epidemiological information.  Fact Sheet for Patients:   StrictlyIdeas.no  Fact Sheet for Healthcare Providers: BankingDealers.co.za  This test is not yet approved or  cleared by the [place] FDA and has been authorized for detection and/or diagnosis of SARS-CoV-2 by FDA under an Emergency Use Authorization (EUA).  This EUA will remain in effect (meaning this test can be used) for the duration of the COVID-19 declaration under Section 564(b)(1) of the Act, 21 U.S.C. section 360bbb-3(b)(1), unless the authorization is terminated or revoked  sooner.  Performed at [facility], [address], [place], [place] [address]          Radiology Studies: DG Chest 2 View  Result Date: 08/18/2019 CLINICAL DATA:  Dyspnea on exertion for 2 weeks, history of metastatic breast cancer EXAM: CHEST - 2 VIEW COMPARISON:  06/10/2019 FINDINGS: Frontal and lateral views of the chest demonstrate extensive diffuse sclerotic bony metastases unchanged since recent chest CT. Minimal blunting of the right costophrenic angle may reflect small effusion. No airspace disease or pneumothorax. Cardiac silhouette is unremarkable. IMPRESSION: 1. Trace right pleural effusion. 2. Extensive sclerotic bony metastases. Electronically Signed   By: Randa Ngo M.D.   On: 08/18/2019 15:38   CT Head Wo Contrast  Result Date: 08/18/2019 CLINICAL DATA:  Headache history of metastatic breast cancer EXAM: CT HEAD WITHOUT CONTRAST TECHNIQUE: Contiguous axial images were obtained  from the base of the skull through the vertex without intravenous contrast. COMPARISON:  CT brain 11/18/2010 FINDINGS: Brain: No acute territorial infarction, hemorrhage or intracranial mass. The ventricles are nonenlarged. Small chronic infarct in the right cerebellum. Stable ventricle size. Vascular: No hyperdense vessels.  No unexpected calcification Skull: Normal. Negative for fracture or focal lesion. Sinuses/Orbits: No acute finding. Other: None IMPRESSION: Negative.  No CT evidence for acute intracranial abnormality Electronically Signed   By: Donavan Foil M.D.   On: 08/18/2019 20:11   CT Angio Chest PE W and/or Wo Contrast  Result Date: 08/18/2019 CLINICAL DATA:  Shortness of breath history of breast cancer EXAM: CT ANGIOGRAPHY CHEST WITH CONTRAST TECHNIQUE: Multidetector CT imaging of the chest was performed using the standard protocol during bolus administration of intravenous contrast. Multiplanar CT image reconstructions and MIPs were obtained to evaluate the vascular anatomy. CONTRAST:  29m OMNIPAQUE IOHEXOL 350 MG/ML SOLN COMPARISON:  Chest x-ray 08/18/2019, CT chest 06/10/2019, PET CT 06/18/2018 FINDINGS: Cardiovascular: Satisfactory opacification of the pulmonary arteries to the segmental level. No evidence of pulmonary embolism. Nonaneurysmal aorta. No dissection. Cardiac size within normal limits. Small pericardial effusion. Mediastinum/Nodes: Midline trachea. No thyroid mass. Left axillary adenopathy with nodes measuring up to 16 mm. Esophagus within normal limits Lungs/Pleura: Moderate right pleural effusion. Patchy bilateral ground-glass density and septal thickening suspect for edema. Upper Abdomen: No acute abnormality.  Small amount of ascites Musculoskeletal: Post mastectomy and reconstruction changes on the left. Right breast implant. Widespread sclerosis involving the shoulder girdles, sternum, ribs and spine consistent with skeletal metastatic disease, progression since 06/10/2019  comparison CT Review of the MIP images confirms the above findings. IMPRESSION: 1. Negative for acute pulmonary embolus or aortic dissection. 2. Moderate right pleural effusion.  Small pericardial effusion. 3. Hazy bilateral lung densities with septal thickening suggestive of edema 4. Mild left axillary adenopathy consistent with metastatic disease. Widespread skeletal sclerosis consistent with metastatic disease, progressed as compared with 06/10/2019. 5. Small amount of upper abdominal ascites Electronically Signed   By: KDonavan FoilM.D.   On: 08/18/2019 20:20   [place]domen Limited RUQ  Result Date: 08/19/2019 CLINICAL DATA:  Elevated LFTs EXAM: ULTRASOUND ABDOMEN LIMITED RIGHT UPPER QUADRANT COMPARISON:  None. FINDINGS: Gallbladder: Gallbladder sludge and layering small stones are seen. Gallbladder wall is 3.3 mm. No sonographic Murphy sign noted by sonographer. Common bile duct: Diameter: 5.2 mm Liver: No focal lesion identified. Within normal limits in parenchymal echogenicity. Portal vein is patent on color Doppler imaging with normal direction of blood flow towards the liver. Other: Incidentally noted is a small right pleural effusion. There is a small  amount of perihepatic ascites. There is also mild right pelvicaliectasis present. IMPRESSION: Sludge/gallbladder stones.  No evidence of acute cholecystitis. Small amount of perihepatic ascites and right pleural effusion Mild right hydronephrosis. Electronically Signed   By: Prudencio Pair M.D.   On: 08/19/2019 00:15        Scheduled Meds: . docusate sodium  100 mg Oral BID  . pantoprazole  40 mg Oral Daily  . PARoxetine  20 mg Oral Daily  . sodium chloride flush  3 mL Intravenous Q12H  . sodium chloride flush  3 mL Intravenous Q12H  . trimethoprim  100 mg Oral Daily   Continuous Infusions: . sodium chloride    . 0.9 % NaCl with KCl 20 mEq / L    . potassium chloride 10 mEq (08/19/19 1015)     LOS: 1 day    Time spent: 28  minutes    Sharen Hones, MD [facility]   To contact the attending provider between 7A-7P or the covering provider during after hours 7P-7A, please log into the web site [email or web address] and access using universal [place] password for that web site. If you do not have the password, please call the hospital operator.  08/19/2019, 10:46 AM

## 2019-08-19 NOTE — TOC Initial Note (Signed)
Transition of Care [facility]) - Initial/Assessment Note    Patient Details  Name: Natasha Dennis MRN: [id number] Date of Birth: 27-Jul-1965  Transition of Care [facility]) CM/SW Contact:    Anselm Pancoast, RN Phone Number: 08/19/2019, 2:52 PM  Clinical Narrative:                 LVMM for spouse, Butch, requesting callback to discuss potential discharge needs .        Patient Goals and CMS Choice        Expected Discharge Plan and Services                                                Prior Living Arrangements/Services                       Activities of Daily Living      Permission Sought/Granted                  Emotional Assessment              Admission diagnosis:  Acute respiratory failure with hypoxia ([place]) [J96.01] Patient Active Problem List   Diagnosis Date Noted  . Pleural effusion 08/18/2019  . Anemia associated with chemotherapy 08/18/2019  . AKI (acute kidney injury) ([place]) 08/18/2019  . Dehydration 08/18/2019  . Hypokalemia 08/18/2019  . Hypomagnesemia 08/18/2019  . Acute respiratory failure with hypoxia ([place]) 08/18/2019  . Early satiety   . Heartburn   . Gastritis without bleeding   . Goals of care, counseling/discussion 06/09/2019  . Cancer, metastatic to bone ([place]) 06/09/2019  . Thrombocytopenia ([place]) 12/19/2018  . Primary osteoarthritis of right shoulder 10/15/2018  . Arthritis of right glenohumeral joint 10/15/2018  . Chronic right shoulder pain 10/15/2018  . Anxiety 04/21/2018  . Fungal nail infection 04/21/2018  . History of left breast cancer 02/01/2016  . BMI 33.0-33.9,adult 11/29/2015  . Primary cancer of left breast with metastasis to other site ([place]) 09/28/2015  . Osteopenia 09/28/2015  . GERD (gastroesophageal reflux disease) 09/16/2014   PCP:  Verl Bangs, FNP Pharmacy:   [facility], [place] [address] Phone:  [phone] Fax: [phone]  [id number] [place], [place] [address] [address] Phone: [phone] Fax: [place], [place] [address] Phone: [phone] Fax: [phone]     Social Determinants of Health (SDOH) Interventions    Readmission Risk Interventions No flowsheet data found.

## 2019-08-19 NOTE — Evaluation (Signed)
Occupational Therapy Evaluation Patient Details Name: Natasha Dennis MRN: [id number] DOB: May 08, 1965 Today's Date: 08/19/2019    History of Present Illness Patient is a pleasant 54 year old female who came to the ED from oncologist when she was found to be tachycardic and desaturating. PMh includes metastatic breast cancer with mets to bone marrow, GERD, thrombocytopenia, pleural effusion, anemia associated with chemotherapy, dehydration, AKI, hypomagnesemia, . Was hypoxic with CT scan showing moderate righ tside pleural effusion and acute kdiney injury with hypokalemia,   Clinical Impression   Natasha Dennis presents to OT with poor endurance and generalized weakness that impacts her ability to safely and independently complete functional tasks.  Prior to admission, pt was grossly mod I in all ADLs and IADLs, however requiring extra time recently 2/2 fatigue and poor endurance.  Pt currently requires supervision/stand by assist for basic ADLs 2/2 poor endurance.  OTR provided cueing for pacing and monitoring O2 level with activity.  Pt was able to complete bed mobility and sit to stand transfer independently, but her SpO2 level on 2L [place] dropped from 95 to 90% with standing.  OTR educated pt on pursed lip breathing technique and energy conservation strategies.  Pt verbalized understanding.  Pt's SpO2 quickly recovered with rest and pursed lip breathing.  [name] will continue to benefit from skilled OT services in acute setting to address endurance, functional strengthening, safety, and independence in ADLs.  Recommend HHOT upon discharge.    Follow Up Recommendations  Home health OT    Equipment Recommendations  Tub/shower seat;3 in 1 bedside commode    Recommendations for Other Services       Precautions / Restrictions Precautions Precautions: None Restrictions Weight Bearing Restrictions: No      Mobility Bed Mobility Overal bed mobility: Independent             General  bed mobility comments: supine to sit and sit to supine ind with no need for increase time or UE support  Transfers Overall transfer level: Independent Equipment used: None             General transfer comment: no AD required, fatigued but able to transfer without LOB or assistance    Balance Overall balance assessment: Mild deficits observed, not formally tested                                         ADL either performed or assessed with clinical judgement   ADL Overall ADL's : Needs assistance/impaired                                     Functional mobility during ADLs: Supervision/safety General ADL Comments: Pt generally requires supervision/stand by assist for ADLs 2/2 poor endurance.  OTR provided cueing for pacing and monitoring O2 level.     Vision Baseline Vision/History: Wears glasses Wears Glasses: Reading only Patient Visual Report: No change from baseline Vision Assessment?: No apparent visual deficits     Perception     Praxis      Pertinent Vitals/Pain Pain Assessment: No/denies pain     Hand Dominance     Extremity/Trunk Assessment Upper Extremity Assessment Upper Extremity Assessment: Overall WFL for tasks assessed (grossly 4-/5 strength, decreased endurance)   Lower Extremity Assessment Lower Extremity Assessment: Defer to PT evaluation   Cervical /  Trunk Assessment Cervical / Trunk Assessment: Normal   Communication Communication Communication: No difficulties   Cognition Arousal/Alertness: Awake/alert Behavior During Therapy: WFL for tasks assessed/performed Overall Cognitive Status: Within Functional Limits for tasks assessed                                 General Comments: grossly oriented, pleasant and engaged in therapy   General Comments  Pt's SpO2 = 96% at rest on 2L [place] O2, decreased to 90% with standing.  Quickly recovered with cues for pursed lip breathing and rest.     Exercises  Other Exercises: provided education re: OT role and plan of care, fall and safety precautions, self care, energy conservation strategies, pursed lip breathing technique   Shoulder Instructions      Home Living Family/patient expects to be discharged to:: Private residence Living Arrangements: Spouse/significant other;Children (daughter staying with pt to help) Available Help at Discharge: Family Type of Home: House Home Access: Stairs to enter Technical brewer of Steps: 3 Entrance Stairs-Rails: Right;Left Home Layout: One level     Bathroom Shower/Tub: Occupational psychologist: Handicapped height     Home Equipment: Grab bars - tub/shower   Additional Comments: Patient reports she has no equipment at home. Is not on oxygen at home, does not use an AD. Lives with husband and daughter has recently come to stay and help out.      Prior Functioning/Environment Level of Independence: Independent        Comments: Pt reports she is independent in all ADLs and IADLs at home, including driving.  She has recently been requiring extra time to complete functional tasks 2/2 poor endurance and SOB.  Pt enjoys camping and building things.        OT Problem List: Decreased strength;Decreased activity tolerance;Impaired balance (sitting and/or standing);Cardiopulmonary status limiting activity;Decreased knowledge of use of DME or AE      OT Treatment/Interventions: Self-care/ADL training;Therapeutic exercise;Energy conservation;DME and/or AE instruction;Therapeutic activities;Balance training;Patient/family education    OT Goals(Current goals can be found in the care plan section) Acute Rehab OT Goals Patient Stated Goal: to return home OT Goal Formulation: With patient Time For Goal Achievement: 09/02/19 Potential to Achieve Goals: Good  OT Frequency: Min 1X/week   Barriers to D/C:            Co-evaluation              AM-PAC OT "6 Clicks" Daily  Activity     Outcome Measure Help from another person eating meals?: None Help from another person taking care of personal grooming?: None Help from another person toileting, which includes using toliet, bedpan, or urinal?: A Little Help from another person bathing (including washing, rinsing, drying)?: A Little Help from another person to put on and taking off regular upper body clothing?: None Help from another person to put on and taking off regular lower body clothing?: A Little 6 Click Score: 21   End of Session    Activity Tolerance: Patient tolerated treatment well Patient left: in bed;with call bell/phone within reach;with family/visitor present  OT Visit Diagnosis: Other abnormalities of gait and mobility (R26.89);Muscle weakness (generalized) (M62.81)                Time: 4496-7591 OT Time Calculation (min): 10 min Charges:  OT General Charges $OT Visit: 1 Visit OT Evaluation $OT Eval Moderate Complexity: 1 Mod  [place],  OTR/L 08/19/19, 2:04 PM

## 2019-08-19 NOTE — Assessment & Plan Note (Addendum)
#  55 year old female patient with a history of Metastatic breast carcinoma-involving the bone marrow/bones/GI tract involvement-is currently admitted to hospital for worsening shortness of breath/nausea vomiting diarrhea/acute renal failure  #Acute hypoxic respiratory failure-likely secondary to progressive right pleural effusion/question lymphangitic spread; clinically suspicious for malignant effusion-status post thoracentesis; cytology positive for breast cancer.  #A. fib with RVR--on Eliquis rate controlled; Cardizem.  #Metastatic breast cancer ER/PR positive-bone marrow/bones/GI involvement- currently on Faslodex; discontinued abemaciclib secondary to nausea vomiting prerenal/acute renal failure.  Discussed regarding possible use of chemotherapy given the poor tolerance to current therapy; also progression of disease.   #Acute renal failure-creatinine 1.5 baseline 0.9; monitor closely.  #Discussed with the patient about plan of care; will reach out to patient daughter Crystal.

## 2019-08-19 NOTE — TOC Progression Note (Addendum)
Transition of Care [facility]) - Progression Note    Patient Details  Name: Natasha Dennis MRN: [id number] Date of Birth: 05-Dec-1965  [facility] CM/SW Contact  Anselm Pancoast, RN Phone Number: 08/19/2019, 3:10 PM  Clinical Narrative:    RN CM outreached to attending and charge nurse with request for hospital bed. Patient will be transferred to hospital bed/mattress for comfort.   Outreach to [facility] for possible home health-they are out of network for this insurance.    Expected Discharge Plan: [place] Barriers to Discharge: Continued Medical Work up  Expected Discharge Plan and Services Expected Discharge Plan: [place] Choice: [place] arrangements for the past 2 months: Single Family Home                                       Social Determinants of Health (SDOH) Interventions    Readmission Risk Interventions No flowsheet data found.

## 2019-08-20 ENCOUNTER — Inpatient Hospital Stay: Payer: No Typology Code available for payment source

## 2019-08-20 ENCOUNTER — Inpatient Hospital Stay
Admit: 2019-08-20 | Discharge: 2019-08-20 | Disposition: A | Discharge status: Home / Self Care | Payer: No Typology Code available for payment source | Attending: Acute Care | Admitting: Acute Care

## 2019-08-20 DIAGNOSIS — I482 Chronic atrial fibrillation, unspecified: Secondary | ICD-10-CM

## 2019-08-20 DIAGNOSIS — I4891 Unspecified atrial fibrillation: Secondary | ICD-10-CM

## 2019-08-20 DIAGNOSIS — E43 Unspecified severe protein-calorie malnutrition: Secondary | ICD-10-CM | POA: Insufficient documentation

## 2019-08-20 LAB — BASIC METABOLIC PANEL
Anion gap: 9 (ref 5–15)
Anion gap: 8 (ref 5–15)
BUN: 23 mg/dL — ABNORMAL HIGH (ref 6–20)
BUN: 23 mg/dL — ABNORMAL HIGH (ref 6–20)
CO2: 21 mmol/L — ABNORMAL LOW (ref 22–32)
CO2: 21 mmol/L — ABNORMAL LOW (ref 22–32)
Calcium: 7.8 mg/dL — ABNORMAL LOW (ref 8.9–10.3)
Calcium: 7.7 mg/dL — ABNORMAL LOW (ref 8.9–10.3)
Chloride: 105 mmol/L (ref 98–111)
Chloride: 106 mmol/L (ref 98–111)
Creatinine, Ser: 1.43 mg/dL — ABNORMAL HIGH (ref 0.44–1.00)
Creatinine, Ser: 1.41 mg/dL — ABNORMAL HIGH (ref 0.44–1.00)
GFR calc Af Amer: 48 mL/min — ABNORMAL LOW (ref >60)
GFR calc Af Amer: 48 mL/min — ABNORMAL LOW (ref >60)
GFR calc non Af Amer: 41 mL/min — ABNORMAL LOW (ref >60)
GFR calc non Af Amer: 42 mL/min — ABNORMAL LOW (ref >60)
Glucose, Bld: 95 mg/dL (ref 70–99)
Glucose, Bld: 106 mg/dL — ABNORMAL HIGH (ref 70–99)
Potassium: 3.3 mmol/L — ABNORMAL LOW (ref 3.5–5.1)
Potassium: 3.8 mmol/L (ref 3.5–5.1)
Sodium: 135 mmol/L (ref 135–145)
Sodium: 135 mmol/L (ref 135–145)

## 2019-08-20 LAB — CBC WITH DIFFERENTIAL/PLATELET
Abs Immature Granulocytes: 0.09 10*3/uL — ABNORMAL HIGH (ref 0.00–0.07)
Basophils Absolute: 0.1 10*3/uL (ref 0.0–0.1)
Basophils Relative: 1 %
Eosinophils Absolute: 0.1 10*3/uL (ref 0.0–0.5)
Eosinophils Relative: 1 %
HCT: 28 % — ABNORMAL LOW (ref 36.0–46.0)
Hemoglobin: 8.6 g/dL — ABNORMAL LOW (ref 12.0–15.0)
Immature Granulocytes: 1 %
Lymphocytes Relative: 48 %
Lymphs Abs: 4.7 10*3/uL — ABNORMAL HIGH (ref 0.7–4.0)
MCH: 27.4 pg (ref 26.0–34.0)
MCHC: 30.7 g/dL (ref 30.0–36.0)
MCV: 89.2 fL (ref 80.0–100.0)
Monocytes Absolute: 0.5 10*3/uL (ref 0.1–1.0)
Monocytes Relative: 6 %
Neutro Abs: 4.1 10*3/uL (ref 1.7–7.7)
Neutrophils Relative %: 43 %
Platelets: 115 10*3/uL — ABNORMAL LOW (ref 150–400)
RBC: 3.14 MIL/uL — ABNORMAL LOW (ref 3.87–5.11)
RDW: 24.5 % — ABNORMAL HIGH (ref 11.5–15.5)
Smear Review: NORMAL
WBC: 9.5 10*3/uL (ref 4.0–10.5)
nRBC: 0.8 % — ABNORMAL HIGH (ref 0.0–0.2)

## 2019-08-20 LAB — PH, BODY FLUID: pH, Body Fluid: 7.4

## 2019-08-20 LAB — MAGNESIUM
Magnesium: 2.2 mg/dL (ref 1.7–2.4)
Magnesium: 2.1 mg/dL (ref 1.7–2.4)

## 2019-08-20 LAB — PROTEIN, BODY FLUID (OTHER): Total Protein, Body Fluid Other: 2.1 g/dL

## 2019-08-20 LAB — FIBRIN DERIVATIVES D-DIMER (ARMC ONLY): Fibrin derivatives D-dimer (ARMC): 6352.21 ng/mL (FEU) — ABNORMAL HIGH (ref 0.00–499.00)

## 2019-08-20 LAB — APTT: aPTT: 45 seconds — ABNORMAL HIGH (ref 24–36)

## 2019-08-20 LAB — PROTIME-INR
INR: 1.8 — ABNORMAL HIGH (ref 0.8–1.2)
Prothrombin Time: 19.9 seconds — ABNORMAL HIGH (ref 11.4–15.2)

## 2019-08-20 LAB — CYTOLOGY - NON PAP

## 2019-08-20 LAB — BRAIN NATRIURETIC PEPTIDE: B Natriuretic Peptide: 316.4 pg/mL — ABNORMAL HIGH (ref 0.0–100.0)

## 2019-08-20 LAB — HEPARIN LEVEL (UNFRACTIONATED): Heparin Unfractionated: 0.45 IU/mL (ref 0.30–0.70)

## 2019-08-20 MED ORDER — FUROSEMIDE 10 MG/ML IJ SOLN: 20.0000 mg | Freq: Once | INTRAMUSCULAR | Status: DC

## 2019-08-20 MED ORDER — METOPROLOL TARTRATE 5 MG/5ML IV SOLN
5.0000 mg | INTRAVENOUS | Status: DC | PRN
  Administered 2019-08-20: 5 mg via INTRAVENOUS
  Filled 2019-08-20 (×2): qty 5

## 2019-08-20 MED ORDER — APIXABAN 5 MG PO TABS
5.0000 mg | ORAL_TABLET | Freq: Two times a day (BID) | ORAL | Status: DC
  Administered 2019-08-20 – 2019-08-21 (×2): 5 mg via ORAL
  Filled 2019-08-20 (×2): qty 1

## 2019-08-20 MED ORDER — POTASSIUM CHLORIDE CRYS ER 20 MEQ PO TBCR
40.0000 meq | EXTENDED_RELEASE_TABLET | Freq: Once | ORAL | Status: AC
  Administered 2019-08-20: 40 meq via ORAL
  Filled 2019-08-20: qty 2

## 2019-08-20 MED ORDER — HEPARIN (PORCINE) 25000 UT/250ML-% IV SOLN
1000.0000 [IU]/h | INTRAVENOUS | Status: DC
  Administered 2019-08-20: 02:00:00 1000 [IU]/h via INTRAVENOUS
  Filled 2019-08-20: qty 250

## 2019-08-20 MED ORDER — DILTIAZEM HCL ER COATED BEADS 180 MG PO CP24
180.0000 mg | ORAL_CAPSULE | Freq: Every day | ORAL | Status: DC
  Administered 2019-08-20 – 2019-08-21 (×2): 180 mg via ORAL
  Filled 2019-08-20 (×2): qty 1

## 2019-08-20 MED ORDER — KATE FARMS STANDARD 1.4 PO LIQD
325.0000 mL | Freq: Three times a day (TID) | ORAL | Status: DC
  Administered 2019-08-21: 09:00:00 325 mL via ORAL
  Filled 2019-08-20 (×3): qty 325

## 2019-08-20 NOTE — Progress Notes (Addendum)
ANTICOAGULATION CONSULT NOTE - Initial Consult  Pharmacy Consult for heparin Indication: atrial fibrillation  Allergies  Allergen Reactions  . Influenza Vaccines Swelling    Joint swelling, rash    Patient Measurements: Height: _0  (167.6 cm) Weight: 79 kg (174 lb 2.6 oz) IBW/kg (Calculated) : 59.3 Heparin Dosing Weight: 67 kg  Vital Signs: Temp: 97.6 F (36.4 C) (06/17 0041) Temp Source: Oral (06/16 2028) BP: 115/68 (06/17 0041) Pulse Rate: 103 (06/17 0041)  Labs: Recent Labs    08/18/19 1038 08/18/19 1846 08/18/19 2024 08/19/19 0611 08/20/19 0027  HGB 8.9*  --   --  8.2*  --   HCT 28.6*  --   --  26.6*  --   PLT 131*  --   --  126*  --   APTT  --   --   --   --  45*  LABPROT  --   --   --   --  19.9*  INR  --   --   --   --  1.8*  CREATININE 1.53*  --   --  1.58* 1.43*  CKTOTAL  --   --   --  43  --   TROPONINIHS  --  16 14  --   --     Estimated Creatinine Clearance: 47.2 mL/min (A) (by C-G formula based on SCr of 1.43 mg/dL (H)).   Medical History: Past Medical History:  Diagnosis Date  . Breast cancer ([place]) 03-17-2009   lt Mastectomy and chemo  . Cancer ([place])   . GERD (gastroesophageal reflux disease)   . History of bone marrow biopsy 06/03/2019  . Neoplastic malignant related fatigue     Medications:  Scheduled:  . docusate sodium  100 mg Oral BID  . pantoprazole  40 mg Oral Daily  . PARoxetine  20 mg Oral Daily  . potassium chloride  40 mEq Oral Once  . sodium chloride flush  3 mL Intravenous Q12H  . sodium chloride flush  3 mL Intravenous Q12H  . trimethoprim  100 mg Oral Daily    Assessment: Patient admitted for SOB w/ h/o breast CA w/ mets to bone, baseline CBC is low w/ declining platelets possibly s/t chemo, aPTT also initially elevated d/t same. On EKG patient is in new onset afib not on anticoagulation PTA. Patient is being started on heparin drip for anticoagulation of new onset afib w/ CHADS-VASc = 2.  Goal of Therapy:  Heparin  level 0.3-0.7 units/ml Monitor platelets by anticoagulation protocol: Yes   Plan:  Will omit bolus considering patient's baseline CBC is already low for patient's demographic with declining platelets and elevated aPTT possibly d/t chemo. Will start rate at 1000 units/hr and will check an anti-Xa level at 1000. Will monitor CBC's w/ routine labs and adjust per anti-Xa levels.  Tobie Lords, PharmD, BCPS Clinical Pharmacist 08/20/2019,1:44 AM

## 2019-08-20 NOTE — Progress Notes (Signed)
Initial Nutrition Assessment  DOCUMENTATION CODES:   Severe malnutrition in context of chronic illness  INTERVENTION:  Recommend liberalizing diet to regular.  Provide Dillard [place] Standard 1.4 Cal vanilla po TID between meals, each supplement provides 455 kcal and 20 grams of protein.  Provided "Making the Most of Each Bite" and "Taste and Smell Changes" handouts from the Academy of [employer] Dietetics to patient. Encouraged patient to eat small, frequent meals throughout the day. Discussed strategies to increase calorie/protein intake. Discussed strategies for taste changes.  NUTRITION DIAGNOSIS:   Severe Malnutrition related to chronic illness (metastatic breast cancer) as evidenced by 16.8% weight loss over the past 8 months, moderate fat depletion, severe fat depletion, moderate muscle depletion, severe muscle depletion.  GOAL:   Patient will meet greater than or equal to 90% of their needs  MONITOR:   PO intake, Supplement acceptance, Labs, Weight trends, I & O's  REASON FOR ASSESSMENT:   Malnutrition Screening Tool, Consult Malnutrition Eval  ASSESSMENT:   54 year old female with PMHx of GERD, metastatic invasive lobular breast cancer on first-line therapy with abemaciclib plus Faslodex admitted with N/V/D, acute respiratory failure with hypoxemia due to pleural effusion, A-fib with RVR.   Met with patient at bedside. She reports she has had a poor appetite for the past 7-8 weeks since she has been on chemotherapy. She was experiencing N/V but reports that has improved now. She also experiences taste changes and reports everything tastes metallic. She is also experiencing diarrhea. She can only eat small amounts at meals now. She reports attempting to eat several times throughout the day but she can only have a few bites. Today at lunch she had 5-6 bites of soup. She reports she has tried Boost before but it was too thick and sweet and she could not tolerate. Discussed ONS  options that are less sweet. Patient is amenable to trying Dillard [place] to see if she can tolerate. Patient reports she has an appointment scheduled next week with outpatient RD at cancer center.   Patient reports her UBW was 215 lbs and that she has been losing weight since she started chemo. According to chart patient has not been 215 lbs since 2019. She was 209.4 lbs on 12/19/2018, 192.6 lbs on 05/25/2019, 184.6 lbs on 06/23/2019, and is now 79 kg (174.16 lbs). She has lost 35.24 lbs (16.8% body weight) over the past 8 months, which is significant for time frame. Of that she has lost 18.44 lbs (9.6% body weight) over the past 3 months.  Medications reviewed and include: Protonix.  Labs reviewed: CO2 21, BUN 23, Creatinine 1.41.  NUTRITION - FOCUSED PHYSICAL EXAM:    Most Recent Value  Orbital Region Severe depletion  Upper Arm Region Moderate depletion  Thoracic and Lumbar Region Mild depletion  Buccal Region Severe depletion  Temple Region Severe depletion  Clavicle Bone Region Moderate depletion  Clavicle and Acromion Bone Region Moderate depletion  Scapular Bone Region Unable to assess  Dorsal Hand Moderate depletion  Patellar Region Moderate depletion  Anterior Thigh Region Moderate depletion  Posterior Calf Region Severe depletion  Edema (RD Assessment) None  Hair Reviewed  Eyes Reviewed  Mouth Reviewed  Skin Reviewed  Nails Reviewed     Diet Order:   Diet Order            Diet Heart Room service appropriate? Yes; Fluid consistency: Thin  Diet effective now  EDUCATION NEEDS:   Education needs have been addressed  Skin:  Skin Assessment: Reviewed RN Assessment  Last BM:  08/19/2019  Height:   Ht Readings from Last 1 Encounters:  08/18/19 '5\' 6"'  (1.676 m)   Weight:   Wt Readings from Last 1 Encounters:  08/18/19 79 kg   BMI:  Body mass index is 28.11 kg/m.  Estimated Nutritional Needs:   Kcal:  2000-2300  Protein:  100-115 grams  Fluid:   2-2.3 L/day  Jacklynn Barnacle, MS, RD, LDN Pager number available on Amion

## 2019-08-20 NOTE — Progress Notes (Signed)
/  Cross Cover Brief Note New onset atrial fib/flutter. Ventricular rate mostly below 120, but had some non sustained rates 130-140,   Patietn denies associated symptoms. She does endorse shortness of breath expecially noted with exertion but ongoijg for the last 3 weeks.D dimer above 6000, CTA chest 6/15 IMPRESSION: 1. Negative for acute pulmonary embolus or aortic dissection. 2. Moderate right pleural effusion.  Small pericardial effusion. 3. Hazy bilateral lung densities with septal thickening suggestive of edema 4. Mild left axillary adenopathy consistent with metastatic disease. Widespread skeletal sclerosis consistent with metastatic disease, progressed as compared with 06/10/2019. 5. Small amount of upper abdominal ascites  CHA2DS2VASc = 2. Repeated chest xray without frank increase in interstitial edema, however bnp slightly more elevate than day prior.   Continuous IV fluids discontinued PRN metoprolol ordered for v rate above 120.  Heparin drip initiated per pharmacy dosing Cardiology consulted Echo ordered.

## 2019-08-20 NOTE — Consult Note (Signed)
CARDIOLOGY CONSULT NOTE               Patient ID: Natasha Dennis MRN: [id number] DOB/AGE: 10/10/1965 54 y.o.  Admit date: 08/18/2019 Referring Physician Sharen Hones, MD Primary Physician [place] Primary Cardiologist None per patient Reason for Consultation New onset atrial flutter/fibrillation  HPI: 54 year old female referred for evaluation of new onset atrial flutter/atrial fibrillation with a history of metastatic breast cancer. The patient presented to [facility] ER with nausea, vomiting, and diarrhea since starting chemotherapy in January. While at an oncology office visit, the patient was noted to be tachycardic, hypoxic, and was sent to the ER. Patient reported poor oral intake recently. Admission labs notable K 2.8, Na 131, BUN 25, creatinine 1.53, AST 88, alk phos 359, BNP 268.7 and 316.4. Chest CT revealed a new moderate right pleural effusion and small pericardial effusion, without evidence of PE. The patient underwent thoracentesis yesterday, yielding 400 mL of fluid. Early this morning, CCMD notified nursing staff that patient was in atrial fibrillation/flutter. She was started on heparin drip, echocardiogram was order, and was given IV metoprolol. The patient is minimally symptomatic, reporting occasional brief fluttering sensation without chest pain. Her breathing is somewhat improved status post thoracentesis. She has a chads vasc score of 1 (female).      Review of systems complete and found to be negative unless listed above     Past Medical History:  Diagnosis Date  . Breast cancer ([place]) 03-17-2009   lt Mastectomy and chemo  . Cancer ([place])   . GERD (gastroesophageal reflux disease)   . History of bone marrow biopsy 06/03/2019  . Neoplastic malignant related fatigue     Past Surgical History:  Procedure Laterality Date  . ABDOMINAL HYSTERECTOMY    . AUGMENTATION MAMMAPLASTY Right    RT BREAST IMPLANT ONLY  . ESOPHAGOGASTRODUODENOSCOPY (EGD)  WITH PROPOFOL N/A 07/17/2019   Procedure: ESOPHAGOGASTRODUODENOSCOPY (EGD) WITH PROPOFOL;  Surgeon: Lucilla Lame, MD;  Location: [facility] ENDOSCOPY;  Service: Endoscopy;  Laterality: N/A;  . MASTECTOMY Left 2011  . OOPHORECTOMY      Medications Prior to Admission  Medication Sig Dispense Refill Last Dose  . abemaciclib (VERZENIO) 150 MG tablet Take 1 tablet (150 mg total) by mouth every 12 (twelve) hours. 56 tablet 6 Past Week at 2200  . ALPRAZolam (XANAX) 1 MG tablet Take 1 tablet (1 mg total) by mouth at bedtime as needed for anxiety. 30 tablet 0 prn at prn  . [place]-Glucosamine-Vit D (OSTEO BI-FLEX ONE PER DAY PO) Take by mouth.   Past Week at 0900  . cholecalciferol (VITAMIN D) 1000 units tablet Take 1,000 Units by mouth daily.   Past Week at 0900  . dexlansoprazole (DEXILANT) 60 MG capsule Take 1 capsule (60 mg total) by mouth daily. 30 capsule 11 Past Week at 0900  . diphenoxylate-atropine (LOMOTIL) 2.5-0.025 MG tablet Take 1 tablet by mouth 4 (four) times daily as needed for diarrhea or loose stools. Take it along with immodium 30 tablet 0 Past Month at prn  . ergocalciferol (VITAMIN D2) 1.25 MG (50000 UT) capsule Take 1 capsule (50,000 Units total) by mouth once a week. 12 capsule 1 Past Week at 0900  . ondansetron (ZOFRAN) 8 MG tablet Take one pill 30-60 mins prior to taking Verzinio to prevent nausea. 40 tablet 1 Past Month at 2200  . PARoxetine (PAXIL) 20 MG tablet Take 1 tablet (20 mg total) by mouth daily. 90 tablet 1 Past Week at 0900  . prochlorperazine (  COMPAZINE) 10 MG tablet Take 1 tablet (10 mg total) by mouth every 6 (six) hours as needed for nausea or vomiting. 30 tablet 3 Past Week at prn  . trimethoprim (TRIMPEX) 100 MG tablet Take 1 tablet (100 mg total) by mouth daily. 90 tablet 0 Past Week at 0900   Social History   Socioeconomic History  . Marital status: Married    Spouse name: Not on file  . Number of children: Not on file  . Years of education: Not on file  .  Highest education level: Not on file  Occupational History  . Not on file  Tobacco Use  . Smoking status: Former Smoker    Quit date: 03/05/1984    Years since quitting: 35.4  . Smokeless tobacco: Never Used  Vaping Use  . Vaping Use: Never used  Substance and Sexual Activity  . Alcohol use: No  . Drug use: No  . Sexual activity: Not on file  Other Topics Concern  . Not on file  Social History Narrative  . Not on file   Social Determinants of Health   Financial Resource Strain:   . Difficulty of Paying Living Expenses:   Food Insecurity:   . Worried About Charity fundraiser in the Last Year:   . Arboriculturist in the Last Year:   Transportation Needs:   . Film/video editor (Medical):   Marland Kitchen Lack of Transportation (Non-Medical):   Physical Activity:   . Days of Exercise per Week:   . Minutes of Exercise per Session:   Stress:   . Feeling of Stress :   Social Connections:   . Frequency of Communication with Friends and Family:   . Frequency of Social Gatherings with Friends and Family:   . Attends Religious Services:   . Active Member of Clubs or Organizations:   . Attends Archivist Meetings:   Marland Kitchen Marital Status:   Intimate Partner Violence:   . Fear of Current or Ex-Partner:   . Emotionally Abused:   Marland Kitchen Physically Abused:   . Sexually Abused:     Family History  Problem Relation Age of Onset  . Diabetes Mother   . Arthritis Mother   . Heart disease Father   . Breast cancer Neg Hx       Review of systems complete and found to be negative unless listed above      PHYSICAL EXAM  General: Well developed, well nourished, in no acute distress, sitting up in bed HEENT:  Normocephalic and atramatic Neck:  No JVD.  Lungs: Clear bilaterally to auscultation, normal effort of breathing on supplemental oxygen via [place] Heart: irregularly irregular, no murmurs Abdomen: no obvious distention Msk: no obvious deformities Extremities: No clubbing, cyanosis  or edema.   Neuro: Alert and oriented X 3. Psych:  Good affect, responds appropriately  Labs:   Lab Results  Component Value Date   WBC 9.5 08/20/2019   HGB 8.6 (L) 08/20/2019   HCT 28.0 (L) 08/20/2019   MCV 89.2 08/20/2019   PLT 115 (L) 08/20/2019    Recent Labs  Lab 08/19/19 0611 08/20/19 0027 08/20/19 0515  NA 136   < > 135  K 3.0*   < > 3.8  CL 105   < > 106  CO2 21*   < > 21*  BUN 24*   < > 23*  CREATININE 1.58*   < > 1.41*  CALCIUM 7.5*   < > 7.7*  PROT 5.2*  --   --  BILITOT 0.8  --   --   ALKPHOS 337*  --   --   ALT 41  --   --   AST 93*  --   --   GLUCOSE 92   < > 106*   < > = values in this interval not displayed.   Lab Results  Component Value Date   CKTOTAL 43 08/19/2019    Lab Results  Component Value Date   CHOL 195 10/21/2018   CHOL 221 (H) 02/19/2017   CHOL 181 09/28/2015   Lab Results  Component Value Date   HDL 52 10/21/2018   HDL 60 02/19/2017   HDL 56 09/28/2015   Lab Results  Component Value Date   LDLCALC 126 (H) 10/21/2018   LDLCALC 142 (H) 02/19/2017   LDLCALC 99 09/28/2015   Lab Results  Component Value Date   TRIG 87 10/21/2018   TRIG 96 02/19/2017   TRIG 130 09/28/2015   Lab Results  Component Value Date   CHOLHDL 3.7 02/19/2017   CHOLHDL 3.2 09/28/2015   CHOLHDL 4.0 01/14/2015   No results found for: LDLDIRECT    Radiology: DG Chest 1 View  Result Date: 08/20/2019 CLINICAL DATA:  Atrial flutter EXAM: CHEST  1 VIEW COMPARISON:  08/19/2019 FINDINGS: Heart is normal size. Diffuse interstitial prominence throughout the lungs, unchanged. No effusions. Diffuse skeletal sclerotic metastasis noted as seen on prior CT. IMPRESSION: Diffuse interstitial prominence throughout the lungs, stable. Diffuse skeletal metastases. Electronically Signed   By: Rolm Baptise M.D.   On: 08/20/2019 02:48   DG Chest 2 View  Result Date: 08/18/2019 CLINICAL DATA:  Dyspnea on exertion for 2 weeks, history of metastatic breast cancer EXAM:  CHEST - 2 VIEW COMPARISON:  06/10/2019 FINDINGS: Frontal and lateral views of the chest demonstrate extensive diffuse sclerotic bony metastases unchanged since recent chest CT. Minimal blunting of the right costophrenic angle may reflect small effusion. No airspace disease or pneumothorax. Cardiac silhouette is unremarkable. IMPRESSION: 1. Trace right pleural effusion. 2. Extensive sclerotic bony metastases. Electronically Signed   By: Randa Ngo M.D.   On: 08/18/2019 15:38   CT Head Wo Contrast  Result Date: 08/18/2019 CLINICAL DATA:  Headache history of metastatic breast cancer EXAM: CT HEAD WITHOUT CONTRAST TECHNIQUE: Contiguous axial images were obtained from the base of the skull through the vertex without intravenous contrast. COMPARISON:  CT brain 11/18/2010 FINDINGS: Brain: No acute territorial infarction, hemorrhage or intracranial mass. The ventricles are nonenlarged. Small chronic infarct in the right cerebellum. Stable ventricle size. Vascular: No hyperdense vessels.  No unexpected calcification Skull: Normal. Negative for fracture or focal lesion. Sinuses/Orbits: No acute finding. Other: None IMPRESSION: Negative.  No CT evidence for acute intracranial abnormality Electronically Signed   By: Donavan Foil M.D.   On: 08/18/2019 20:11   CT Angio Chest PE W and/or Wo Contrast  Result Date: 08/18/2019 CLINICAL DATA:  Shortness of breath history of breast cancer EXAM: CT ANGIOGRAPHY CHEST WITH CONTRAST TECHNIQUE: Multidetector CT imaging of the chest was performed using the standard protocol during bolus administration of intravenous contrast. Multiplanar CT image reconstructions and MIPs were obtained to evaluate the vascular anatomy. CONTRAST:  42m OMNIPAQUE IOHEXOL 350 MG/ML SOLN COMPARISON:  Chest x-ray 08/18/2019, CT chest 06/10/2019, PET CT 06/18/2018 FINDINGS: Cardiovascular: Satisfactory opacification of the pulmonary arteries to the segmental level. No evidence of pulmonary embolism.  Nonaneurysmal aorta. No dissection. Cardiac size within normal limits. Small pericardial effusion. Mediastinum/Nodes: Midline trachea. No thyroid mass. Left axillary  adenopathy with nodes measuring up to 16 mm. Esophagus within normal limits Lungs/Pleura: Moderate right pleural effusion. Patchy bilateral ground-glass density and septal thickening suspect for edema. Upper Abdomen: No acute abnormality.  Small amount of ascites Musculoskeletal: Post mastectomy and reconstruction changes on the left. Right breast implant. Widespread sclerosis involving the shoulder girdles, sternum, ribs and spine consistent with skeletal metastatic disease, progression since 06/10/2019 comparison CT Review of the MIP images confirms the above findings. IMPRESSION: 1. Negative for acute pulmonary embolus or aortic dissection. 2. Moderate right pleural effusion.  Small pericardial effusion. 3. Hazy bilateral lung densities with septal thickening suggestive of edema 4. Mild left axillary adenopathy consistent with metastatic disease. Widespread skeletal sclerosis consistent with metastatic disease, progressed as compared with 06/10/2019. 5. Small amount of upper abdominal ascites Electronically Signed   By: Donavan Foil M.D.   On: 08/18/2019 20:20   DG Chest Port 1 View  Result Date: 08/19/2019 CLINICAL DATA:  Breast cancer. Mastectomy. Ex-smoker. Status post thoracentesis. EXAM: PORTABLE CHEST 1 VIEW COMPARISON:  08/18/2019 plain film and CTA. FINDINGS: Widespread sclerotic osseous metastasis. Midline trachea. Normal heart size and mediastinal contours. No pleural effusion or pneumothorax. No residual right-sided pleural effusion. Smooth septal thickening. No lobar consolidation. IMPRESSION: 1. No residual right-sided pleural effusion. No pneumothorax. 2. Interstitial thickening, likely related to mild pulmonary venous congestion. Electronically Signed   By: Abigail Miyamoto M.D.   On: 08/19/2019 16:22   US Abdomen Limited  RUQ  Result Date: 08/19/2019 CLINICAL DATA:  Elevated LFTs EXAM: ULTRASOUND ABDOMEN LIMITED RIGHT UPPER QUADRANT COMPARISON:  None. FINDINGS: Gallbladder: Gallbladder sludge and layering small stones are seen. Gallbladder wall is 3.3 mm. No sonographic Murphy sign noted by sonographer. Common bile duct: Diameter: 5.2 mm Liver: No focal lesion identified. Within normal limits in parenchymal echogenicity. Portal vein is patent on color Doppler imaging with normal direction of blood flow towards the liver. Other: Incidentally noted is a small right pleural effusion. There is a small amount of perihepatic ascites. There is also mild right pelvicaliectasis present. IMPRESSION: Sludge/gallbladder stones.  No evidence of acute cholecystitis. Small amount of perihepatic ascites and right pleural effusion Mild right hydronephrosis. Electronically Signed   By: Prudencio Pair M.D.   On: 08/19/2019 00:15   US THORACENTESIS ASP PLEURAL SPACE W/IMG GUIDE  Result Date: 08/19/2019 INDICATION: 54 year old female with small right-sided pleural effusion, history of lobular breast carcinoma EXAM: ULTRASOUND GUIDED RIGHT THORACENTESIS MEDICATIONS: None. COMPLICATIONS: None immediate. PROCEDURE: An ultrasound guided thoracentesis was thoroughly discussed with the patient and questions answered. The benefits, risks, alternatives and complications were also discussed. The patient understands and wishes to proceed with the procedure. Written consent was obtained. Ultrasound was performed to localize and mark an adequate pocket of fluid in the right chest. The area was then prepped and draped in the normal sterile fashion. 1% Lidocaine was used for local anesthesia. Under ultrasound guidance a 6 Fr Safe-T-Centesis catheter was introduced. Thoracentesis was performed. The catheter was removed and a dressing applied. FINDINGS: A total of approximately 400 mL of amber colored fluid was removed. Samples were sent to the laboratory as  requested by the clinical team. IMPRESSION: Successful ultrasound guided right thoracentesis yielding 400 mL of pleural fluid. Electronically Signed   By: Jacqulynn Cadet M.D.   On: 08/19/2019 16:35    Telemetry: atrial flutter 90s-107 bpm  ASSESSMENT AND PLAN:  1. New onset atrial flutter/fibrillation in the setting of metastatic breast cancer, dehydration, and acute hypoxic respiratory failure likely secondary  to moderate pleural effusion, status post thoracentesis. She is minimally symptomatic. IV metoprolol PRN has been given. Heart rate stable in the 90s-low 100s bpm. She has a chads vasc score of 1 (female). Currently on heparin drip. 2. Metastatic breast cancer 3. Acute respiratory failure likely secondary to moderate pleural effusion  Recommendations: 1. Obtain and review 2D echocardiogram.  2. Discontinue heparin; start on Eliquis 5 mg BID despite low chads vasc score in anticipation for possible cardioversion as outpatient if patient does not convert to sinus rhythm. 3. Continue IV metoprolol PRN for now for rates 120 bpm or greater. If blood pressure remains stable, may add daily metoprolol succinate 25 mg for rate control. 4. Avoid overhydrating 5. Replenish K  6. Further recommendations pending patient's initial course.  Signed: Clabe Seal PA-C 08/20/2019, 8:30 AM   Discussed with Dr. Saralyn Pilar who agrees with above plan.

## 2019-08-20 NOTE — Plan of Care (Signed)

## 2019-08-20 NOTE — Progress Notes (Signed)
*  PRELIMINARY RESULTS* Echocardiogram 2D Echocardiogram has been performed.  Natasha Dennis 08/20/2019, 1:43 PM

## 2019-08-20 NOTE — Progress Notes (Signed)
Natasha Dennis   DOB:03/16/1965   EH#:[id number]    Subjective: Overnight patient developed A. fib with RVR; currently on IV heparin.  Continues have shortness of breath especially with minimal exertion.  She continues to continuation of oxygen.  No nausea no vomiting.  Objective:  Vitals:   08/20/19 1500 08/20/19 2008  BP: (!) 113/51 101/66  Pulse: 82 74  Resp: 20 19  Temp: 97.8 F (36.6 C) 98.1 F (36.7 C)  SpO2: 99% 100%     Intake/Output Summary (Last 24 hours) at 08/20/2019 2331 Last data filed at 08/20/2019 1000 Gross per 24 hour  Intake 569.61 ml  Output 600 ml  Net -30.39 ml    Physical Exam Constitutional:      Comments: 2 L nasal cannula oxygen saturating 95%.  Alone.  HENT:     Head: Normocephalic and atraumatic.     Mouth/Throat:     Pharynx: No oropharyngeal exudate.  Eyes:     Pupils: Pupils are equal, round, and reactive to light.  Cardiovascular:     Rate and Rhythm: Tachycardia present. Rhythm irregular.  Pulmonary:     Effort: No respiratory distress.     Breath sounds: Decreased breath sounds present. No wheezing.     Comments: Positive for basilar crackles. Abdominal:     General: Bowel sounds are normal. There is no distension.     Palpations: Abdomen is soft. There is no mass.     Tenderness: There is no abdominal tenderness. There is no guarding or rebound.  Musculoskeletal:        General: No tenderness. Normal range of motion.     Cervical back: Normal range of motion and neck supple.  Skin:    General: Skin is warm.  Neurological:     Mental Status: She is alert and oriented to person, place, and time.  Psychiatric:        Mood and Affect: Affect normal.      Labs:  Lab Results  Component Value Date   WBC 9.5 08/20/2019   HGB 8.6 (L) 08/20/2019   HCT 28.0 (L) 08/20/2019   MCV 89.2 08/20/2019   PLT 115 (L) 08/20/2019   NEUTROABS 4.1 08/20/2019    Lab Results  Component Value Date   NA 135 08/20/2019   K 3.8 08/20/2019   CL  106 08/20/2019   CO2 21 (L) 08/20/2019    Studies:  DG Chest 1 View  Result Date: 08/20/2019 CLINICAL DATA:  Atrial flutter EXAM: CHEST  1 VIEW COMPARISON:  08/19/2019 FINDINGS: Heart is normal size. Diffuse interstitial prominence throughout the lungs, unchanged. No effusions. Diffuse skeletal sclerotic metastasis noted as seen on prior CT. IMPRESSION: Diffuse interstitial prominence throughout the lungs, stable. Diffuse skeletal metastases. Electronically Signed   By: Rolm Baptise M.D.   On: 08/20/2019 02:48   DG Chest Port 1 View  Result Date: 08/19/2019 CLINICAL DATA:  Breast cancer. Mastectomy. Ex-smoker. Status post thoracentesis. EXAM: PORTABLE CHEST 1 VIEW COMPARISON:  08/18/2019 plain film and CTA. FINDINGS: Widespread sclerotic osseous metastasis. Midline trachea. Normal heart size and mediastinal contours. No pleural effusion or pneumothorax. No residual right-sided pleural effusion. Smooth septal thickening. No lobar consolidation. IMPRESSION: 1. No residual right-sided pleural effusion. No pneumothorax. 2. Interstitial thickening, likely related to mild pulmonary venous congestion. Electronically Signed   By: Abigail Miyamoto M.D.   On: 08/19/2019 16:22   US Abdomen Limited RUQ  Result Date: 08/19/2019 CLINICAL DATA:  Elevated LFTs EXAM: ULTRASOUND ABDOMEN LIMITED RIGHT UPPER  QUADRANT COMPARISON:  None. FINDINGS: Gallbladder: Gallbladder sludge and layering small stones are seen. Gallbladder wall is 3.3 mm. No sonographic Murphy sign noted by sonographer. Common bile duct: Diameter: 5.2 mm Liver: No focal lesion identified. Within normal limits in parenchymal echogenicity. Portal vein is patent on color Doppler imaging with normal direction of blood flow towards the liver. Other: Incidentally noted is a small right pleural effusion. There is a small amount of perihepatic ascites. There is also mild right pelvicaliectasis present. IMPRESSION: Sludge/gallbladder stones.  No evidence of acute  cholecystitis. Small amount of perihepatic ascites and right pleural effusion Mild right hydronephrosis. Electronically Signed   By: Prudencio Pair M.D.   On: 08/19/2019 00:15   US THORACENTESIS ASP PLEURAL SPACE W/IMG GUIDE  Result Date: 08/19/2019 INDICATION: 54 year old female with small right-sided pleural effusion, history of lobular breast carcinoma EXAM: ULTRASOUND GUIDED RIGHT THORACENTESIS MEDICATIONS: None. COMPLICATIONS: None immediate. PROCEDURE: An ultrasound guided thoracentesis was thoroughly discussed with the patient and questions answered. The benefits, risks, alternatives and complications were also discussed. The patient understands and wishes to proceed with the procedure. Written consent was obtained. Ultrasound was performed to localize and mark an adequate pocket of fluid in the right chest. The area was then prepped and draped in the normal sterile fashion. 1% Lidocaine was used for local anesthesia. Under ultrasound guidance a 6 Fr Safe-T-Centesis catheter was introduced. Thoracentesis was performed. The catheter was removed and a dressing applied. FINDINGS: A total of approximately 400 mL of amber colored fluid was removed. Samples were sent to the laboratory as requested by the clinical team. IMPRESSION: Successful ultrasound guided right thoracentesis yielding 400 mL of pleural fluid. Electronically Signed   By: Jacqulynn Cadet M.D.   On: 08/19/2019 16:35    Primary cancer of left breast with metastasis to other site [facility]) #54 year old female patient with a history of Metastatic breast carcinoma-involving the bone marrow/bones/GI tract involvement-is currently admitted to hospital for worsening shortness of breath/nausea vomiting diarrhea/acute renal failure  #Acute hypoxic respiratory failure-likely secondary to progressive right pleural effusion/question lymphangitic spread; clinically suspicious for malignant effusion-status post thoracentesis; awaiting cytology.   Interstitial infiltrates-question CHF; awaiting 2D echo.  #A. fib with RVR-IV heparin; beta-blocker awaiting cardiac evaluation; echo as above.  #Metastatic breast cancer ER/PR positive-bone marrow/bones/GI involvement- currently on Faslodex; discontinued abemaciclib secondary to nausea vomiting prerenal/acute renal failure.  #Acute renal failure-creatinine 1.5 baseline 0.9; secondary to prerenal-continue hydration.  #I discussed with the patient the above plan.  Also spoke to patient's daughter Crystal over the phone/updated current status.  Cammie Sickle, MD 08/20/2019

## 2019-08-20 NOTE — Progress Notes (Signed)
PROGRESS NOTE    Natasha Dennis  YYF:[id number] DOB: Dec 29, 1965 DOA: 08/18/2019 PCP: Verl Bangs, FNP   Chief complaint.  Shortness of breath and hypoxemia.  Brief Narrative:  Patient is a 54 year old female with history of metastatic breast cancer who present to the hospital with nausea vomiting and diarrhea.  She was recently seen by oncology, her abemaciclib was discontinued 2 days ago.  Diarrhea has been better since came to the emergency room. She also had some hypoxemia with short of breath.  Chest CT scan showed moderate right-sided pleural effusion.  She had acute kidney injury with hypokalemia.  She received fluids and potassium supplement.  6/16.  Patient had a thoracentesis performed yesterday, moved 500 mL of pleural fluid.  Initial study does not suggest any infection.  Cytology sent out.  6/17.  Patient developed atrial fibrillation with rapid ventricle response.  Received as needed IV metoprolol, initially placed on heparin, transitioned to Eliquis this morning.  Start diltiazem 180 mg daily.     Assessment & Plan:   Active Problems:   GERD (gastroesophageal reflux disease)   Primary cancer of left breast with metastasis to other site (HCC)   Thrombocytopenia (HCC)   Pleural effusion   Anemia associated with chemotherapy   AKI (acute kidney injury) ([place])   Dehydration   Hypokalemia   Hypomagnesemia   Acute respiratory failure with hypoxia ([place])  #1.  Acute respiratory failure with hypoxemia.   Likely due to pleural effusion.  Echocardiogram still pending.  We will try to wean oxygen off as patient oxygen saturation  99 to 100% on 2 L oxygen.  2.  Acute kidney injury secondary to dehydration. Condition improving.  Recheck BMP tomorrow.  3.  Hypokalemia. Improved.  4.  Atrial fibrillation with rapid ventricle response. Anticoagulation started.  Continue diltiazem 180 mg daily.  We will add a beta-blocker if heart rate still fast and blood pressure not  controlled.  5.  Thrombocytopenia and anemia. Likely due to cancer treatment.  Stable.  Check iron B12 level.  6.  Metastatic breast cancer.   DVT prophylaxis: Eliquis Code Status:DNR Family Communication: Husband at bedside. Disposition Plan:   Patient came from: Home                                                                                                                           Anticipated d/c place: Home  Barriers to d/c OR conditions which need to be met to effect a safe d/c:   Consultants:   Oncology  Procedures: Thoracentesis. Antimicrobials:  None    Subjective: Patient developed atrial fibrillation with rapid ventricle response.  Was given IV metoprolol.  Heparin was started, then transitioned to Eliquis. Currently, patient is still on 2 L oxygen, oxygen saturation improved.  No short of breath. No cough.  No chest pain. No dysuria hematuria. No abdominal pain no nausea vomiting.  Objective: Vitals:   08/20/19 0041 08/20/19 0526 08/20/19 0854 08/20/19 1145  BP: 115/68 110/78 108/72 105/72  Pulse: (!) 103 91 87 (!) 101  Resp: '19 19 17 17  ' Temp: 97.6 F (36.4 C) 97.6 F (36.4 C) 97.7 F (36.5 C) 98.2 F (36.8 C)  TempSrc:  Oral Oral Oral  SpO2: 100% 99% 99% 100%  Weight:      Height:        Intake/Output Summary (Last 24 hours) at 08/20/2019 1231 Last data filed at 08/20/2019 0500 Gross per 24 hour  Intake 569.61 ml  Output 200 ml  Net 369.61 ml   Filed Weights   08/18/19 1453  Weight: 79 kg    Examination:  General exam: Appears calm and comfortable  Respiratory system: Clear to auscultation. Respiratory effort normal. Cardiovascular system: Irregularly irregular and tachycardic. No JVD, murmurs, rubs, gallops or clicks. Trace pedal edema. Gastrointestinal system: Abdomen is nondistended, soft and nontender. No organomegaly or masses felt. Normal bowel sounds heard. Central nervous system: Alert and oriented. No focal  neurological deficits. Extremities: Symmetric 5 x 5 power. Skin: No rashes, lesions or ulcers Psychiatry: Judgement and insight appear normal. Mood & affect appropriate.     Data Reviewed: I have personally reviewed following labs and imaging studies  CBC: Recent Labs  Lab 08/18/19 1038 08/19/19 0611 08/20/19 0515  WBC 10.2 9.6 9.5  NEUTROABS 4.1 4.1 4.1  HGB 8.9* 8.2* 8.6*  HCT 28.6* 26.6* 28.0*  MCV 87.2 88.1 89.2  PLT 131* 126* 220*   Basic Metabolic Panel: Recent Labs  Lab 08/18/19 1038 08/19/19 0611 08/20/19 0027 08/20/19 0515  NA 131* 136 135 135  K 2.8* 3.0* 3.3* 3.8  CL 100 105 105 106  CO2 20* 21* 21* 21*  GLUCOSE 120* 92 95 106*  BUN 25* 24* 23* 23*  CREATININE 1.53* 1.58* 1.43* 1.41*  CALCIUM 7.3* 7.5* 7.8* 7.7*  MG 2.1 2.2 2.2 2.1  PHOS  --  4.4  --   --    GFR: Estimated Creatinine Clearance: 47.8 mL/min (A) (by C-G formula based on SCr of 1.41 mg/dL (H)). Liver Function Tests: Recent Labs  Lab 08/18/19 1038 08/19/19 0611  AST 88* 93*  ALT 38 41  ALKPHOS 359* 337*  BILITOT 0.8 0.8  PROT 5.9* 5.2*  ALBUMIN 2.6* 2.4*   No results for input(s): LIPASE, AMYLASE in the last 168 hours. No results for input(s): AMMONIA in the last 168 hours. Coagulation Profile: Recent Labs  Lab 08/20/19 0027  INR 1.8*   Cardiac Enzymes: Recent Labs  Lab 08/19/19 0611  CKTOTAL 43   BNP (last 3 results) No results for input(s): PROBNP in the last 8760 hours. HbA1C: No results for input(s): HGBA1C in the last 72 hours. CBG: No results for input(s): GLUCAP in the last 168 hours. Lipid Profile: No results for input(s): CHOL, HDL, LDLCALC, TRIG, CHOLHDL, LDLDIRECT in the last 72 hours. Thyroid Function Tests: Recent Labs    08/19/19 0611  TSH 0.625   Anemia Panel: Recent Labs    08/19/19 0611  VITAMINB12 1,677*  FOLATE 7.3  FERRITIN 1,348*  TIBC 230*  IRON 61  RETICCTPCT 1.7   Sepsis Labs: Recent Labs  Lab 08/19/19 2542  LATICACIDVEN  0.7    Recent Results (from the past 240 hour(s))  SARS Coronavirus 2 by RT PCR (hospital order, performed in [facility] hospital lab) Nasopharyngeal Nasopharyngeal Swab     Status: None   Collection Time: 08/18/19  8:45 PM   Specimen: Nasopharyngeal Swab  Result Value Ref Range Status   SARS  Coronavirus 2 NEGATIVE NEGATIVE Final    Comment: (NOTE) SARS-CoV-2 target nucleic acids are NOT DETECTED.  The SARS-CoV-2 RNA is generally detectable in upper and lower respiratory specimens during the acute phase of infection. The lowest concentration of SARS-CoV-2 viral copies this assay can detect is 250 copies / mL. A negative result does not preclude SARS-CoV-2 infection and should not be used as the sole basis for treatment or other patient management decisions.  A negative result may occur with improper specimen collection / handling, submission of specimen other than nasopharyngeal swab, presence of viral mutation(s) within the areas targeted by this assay, and inadequate number of viral copies (<250 copies / mL). A negative result must be combined with clinical observations, patient history, and epidemiological information.  Fact Sheet for Patients:   StrictlyIdeas.no  Fact Sheet for Healthcare Providers: BankingDealers.co.za  This test is not yet approved or  cleared by the [place] FDA and has been authorized for detection and/or diagnosis of SARS-CoV-2 by FDA under an Emergency Use Authorization (EUA).  This EUA will remain in effect (meaning this test can be used) for the duration of the COVID-19 declaration under Section 564(b)(1) of the Act, 21 U.S.C. section 360bbb-3(b)(1), unless the authorization is terminated or revoked sooner.  Performed at [facility], [address] [place], [place] [address]   Body fluid culture     Status: None (Preliminary result)   Collection Time: 08/19/19  4:03 PM   Specimen:  PATH Cytology Pleural fluid  Result Value Ref Range Status   Specimen Description   Final    FLUID Performed at [facility], [address], [place], [place] [address]    Special Requests   Final    NONE Performed at [facility], [address] [place], [place] [address]    Gram Stain   Final    MODERATE WBC PRESENT, PREDOMINANTLY MONONUCLEAR NO ORGANISMS SEEN    Culture   Final    NO GROWTH < 24 HOURS Performed at [facility], [place] [address], [place], [place] [address]    Report Status PENDING  Incomplete         Radiology Studies: DG Chest 1 View  Result Date: 08/20/2019 CLINICAL DATA:  Atrial flutter EXAM: CHEST  1 VIEW COMPARISON:  08/19/2019 FINDINGS: Heart is normal size. Diffuse interstitial prominence throughout the lungs, unchanged. No effusions. Diffuse skeletal sclerotic metastasis noted as seen on prior CT. IMPRESSION: Diffuse interstitial prominence throughout the lungs, stable. Diffuse skeletal metastases. Electronically Signed   By: Rolm Baptise M.D.   On: 08/20/2019 02:48   DG Chest 2 View  Result Date: 08/18/2019 CLINICAL DATA:  Dyspnea on exertion for 2 weeks, history of metastatic breast cancer EXAM: CHEST - 2 VIEW COMPARISON:  06/10/2019 FINDINGS: Frontal and lateral views of the chest demonstrate extensive diffuse sclerotic bony metastases unchanged since recent chest CT. Minimal blunting of the right costophrenic angle may reflect small effusion. No airspace disease or pneumothorax. Cardiac silhouette is unremarkable. IMPRESSION: 1. Trace right pleural effusion. 2. Extensive sclerotic bony metastases. Electronically Signed   By: Randa Ngo M.D.   On: 08/18/2019 15:38   CT Head Wo Contrast  Result Date: 08/18/2019 CLINICAL DATA:  Headache history of metastatic breast cancer EXAM: CT HEAD WITHOUT CONTRAST TECHNIQUE: Contiguous axial images were obtained from the base of the skull through the vertex without intravenous contrast.  COMPARISON:  CT brain 11/18/2010 FINDINGS: Brain: No acute territorial infarction, hemorrhage or intracranial mass.  The ventricles are nonenlarged. Small chronic infarct in the right cerebellum. Stable ventricle size. Vascular: No hyperdense vessels.  No unexpected calcification Skull: Normal. Negative for fracture or focal lesion. Sinuses/Orbits: No acute finding. Other: None IMPRESSION: Negative.  No CT evidence for acute intracranial abnormality Electronically Signed   By: Donavan Foil M.D.   On: 08/18/2019 20:11   CT Angio Chest PE W and/or Wo Contrast  Result Date: 08/18/2019 CLINICAL DATA:  Shortness of breath history of breast cancer EXAM: CT ANGIOGRAPHY CHEST WITH CONTRAST TECHNIQUE: Multidetector CT imaging of the chest was performed using the standard protocol during bolus administration of intravenous contrast. Multiplanar CT image reconstructions and MIPs were obtained to evaluate the vascular anatomy. CONTRAST:  25m OMNIPAQUE IOHEXOL 350 MG/ML SOLN COMPARISON:  Chest x-ray 08/18/2019, CT chest 06/10/2019, PET CT 06/18/2018 FINDINGS: Cardiovascular: Satisfactory opacification of the pulmonary arteries to the segmental level. No evidence of pulmonary embolism. Nonaneurysmal aorta. No dissection. Cardiac size within normal limits. Small pericardial effusion. Mediastinum/Nodes: Midline trachea. No thyroid mass. Left axillary adenopathy with nodes measuring up to 16 mm. Esophagus within normal limits Lungs/Pleura: Moderate right pleural effusion. Patchy bilateral ground-glass density and septal thickening suspect for edema. Upper Abdomen: No acute abnormality.  Small amount of ascites Musculoskeletal: Post mastectomy and reconstruction changes on the left. Right breast implant. Widespread sclerosis involving the shoulder girdles, sternum, ribs and spine consistent with skeletal metastatic disease, progression since 06/10/2019 comparison CT Review of the MIP images confirms the above findings.  IMPRESSION: 1. Negative for acute pulmonary embolus or aortic dissection. 2. Moderate right pleural effusion.  Small pericardial effusion. 3. Hazy bilateral lung densities with septal thickening suggestive of edema 4. Mild left axillary adenopathy consistent with metastatic disease. Widespread skeletal sclerosis consistent with metastatic disease, progressed as compared with 06/10/2019. 5. Small amount of upper abdominal ascites Electronically Signed   By: KDonavan FoilM.D.   On: 08/18/2019 20:20   DG Chest Port 1 View  Result Date: 08/19/2019 CLINICAL DATA:  Breast cancer. Mastectomy. Ex-smoker. Status post thoracentesis. EXAM: PORTABLE CHEST 1 VIEW COMPARISON:  08/18/2019 plain film and CTA. FINDINGS: Widespread sclerotic osseous metastasis. Midline trachea. Normal heart size and mediastinal contours. No pleural effusion or pneumothorax. No residual right-sided pleural effusion. Smooth septal thickening. No lobar consolidation. IMPRESSION: 1. No residual right-sided pleural effusion. No pneumothorax. 2. Interstitial thickening, likely related to mild pulmonary venous congestion. Electronically Signed   By: KAbigail MiyamotoM.D.   On: 08/19/2019 16:22   [place]domen Limited RUQ  Result Date: 08/19/2019 CLINICAL DATA:  Elevated LFTs EXAM: ULTRASOUND ABDOMEN LIMITED RIGHT UPPER QUADRANT COMPARISON:  None. FINDINGS: Gallbladder: Gallbladder sludge and layering small stones are seen. Gallbladder wall is 3.3 mm. No sonographic Murphy sign noted by sonographer. Common bile duct: Diameter: 5.2 mm Liver: No focal lesion identified. Within normal limits in parenchymal echogenicity. Portal vein is patent on color Doppler imaging with normal direction of blood flow towards the liver. Other: Incidentally noted is a small right pleural effusion. There is a small amount of perihepatic ascites. There is also mild right pelvicaliectasis present. IMPRESSION: Sludge/gallbladder stones.  No evidence of acute cholecystitis. Small  amount of perihepatic ascites and right pleural effusion Mild right hydronephrosis. Electronically Signed   By: BPrudencio PairM.D.   On: 08/19/2019 00:15   [place]ORACENTESIS ASP PLEURAL SPACE W/IMG GUIDE  Result Date: 08/19/2019 INDICATION: 54year old female with small right-sided pleural effusion, history of lobular breast carcinoma EXAM: ULTRASOUND GUIDED RIGHT THORACENTESIS MEDICATIONS: None. COMPLICATIONS: None  immediate. PROCEDURE: An ultrasound guided thoracentesis was thoroughly discussed with the patient and questions answered. The benefits, risks, alternatives and complications were also discussed. The patient understands and wishes to proceed with the procedure. Written consent was obtained. Ultrasound was performed to localize and mark an adequate pocket of fluid in the right chest. The area was then prepped and draped in the normal sterile fashion. 1% Lidocaine was used for local anesthesia. Under ultrasound guidance a 6 Fr Safe-T-Centesis catheter was introduced. Thoracentesis was performed. The catheter was removed and a dressing applied. FINDINGS: A total of approximately 400 mL of amber colored fluid was removed. Samples were sent to the laboratory as requested by the clinical team. IMPRESSION: Successful ultrasound guided right thoracentesis yielding 400 mL of pleural fluid. Electronically Signed   By: Jacqulynn Cadet M.D.   On: 08/19/2019 16:35        Scheduled Meds: . apixaban  5 mg Oral BID  . diltiazem  180 mg Oral Daily  . docusate sodium  100 mg Oral BID  . pantoprazole  40 mg Oral Daily  . PARoxetine  20 mg Oral Daily  . sodium chloride flush  3 mL Intravenous Q12H  . sodium chloride flush  3 mL Intravenous Q12H  . trimethoprim  100 mg Oral Daily   Continuous Infusions: . sodium chloride       LOS: 2 days    Time spent: 32 minutes    Sharen Hones, MD [facility]   To contact the attending provider between 7A-7P or the covering provider during after  hours 7P-7A, please log into the web site [email or web address] and access using universal [place] password for that web site. If you do not have the password, please call the hospital operator.  08/20/2019, 12:31 PM

## 2019-08-20 NOTE — Plan of Care (Signed)
Was notified by CCMD that pt was in [place]. Promptly notified NP, who ordered labs and Cardiology consult. EEG reflected Aflutter.  IV fluids stopped.  Fibrin derivatives elevated and heparin drip started.  ECHO ordered and chest xray done. IV metoprolol given.  Pt states she's not in pain or SOB.  Ongoing monitoring.

## 2019-08-21 ENCOUNTER — Other Ambulatory Visit: Payer: Self-pay | Admitting: Internal Medicine

## 2019-08-21 LAB — CBC
HCT: 28.1 % — ABNORMAL LOW (ref 36.0–46.0)
Hemoglobin: 8.7 g/dL — ABNORMAL LOW (ref 12.0–15.0)
MCH: 27.9 pg (ref 26.0–34.0)
MCHC: 31 g/dL (ref 30.0–36.0)
MCV: 90.1 fL (ref 80.0–100.0)
Platelets: 119 10*3/uL — ABNORMAL LOW (ref 150–400)
RBC: 3.12 MIL/uL — ABNORMAL LOW (ref 3.87–5.11)
RDW: 24.8 % — ABNORMAL HIGH (ref 11.5–15.5)
WBC: 10.5 10*3/uL (ref 4.0–10.5)
nRBC: 1.1 % — ABNORMAL HIGH (ref 0.0–0.2)

## 2019-08-21 LAB — BASIC METABOLIC PANEL
Anion gap: 10 (ref 5–15)
BUN: 26 mg/dL — ABNORMAL HIGH (ref 6–20)
CO2: 19 mmol/L — ABNORMAL LOW (ref 22–32)
Calcium: 7.8 mg/dL — ABNORMAL LOW (ref 8.9–10.3)
Chloride: 105 mmol/L (ref 98–111)
Creatinine, Ser: 1.5 mg/dL — ABNORMAL HIGH (ref 0.44–1.00)
GFR calc Af Amer: 45 mL/min — ABNORMAL LOW (ref >60)
GFR calc non Af Amer: 39 mL/min — ABNORMAL LOW (ref >60)
Glucose, Bld: 109 mg/dL — ABNORMAL HIGH (ref 70–99)
Potassium: 3.9 mmol/L (ref 3.5–5.1)
Sodium: 134 mmol/L — ABNORMAL LOW (ref 135–145)

## 2019-08-21 LAB — ECHOCARDIOGRAM COMPLETE
Height: 66 in
Weight: 2786.61 oz

## 2019-08-21 LAB — MAGNESIUM: Magnesium: 2.3 mg/dL (ref 1.7–2.4)

## 2019-08-21 MED ORDER — APIXABAN 5 MG PO TABS: 5.0000 mg | ORAL_TABLET | Freq: Two times a day (BID) | ORAL | 0 refills | Status: AC

## 2019-08-21 MED ORDER — DILTIAZEM HCL ER COATED BEADS 180 MG PO CP24: 180.0000 mg | ORAL_CAPSULE | Freq: Every day | ORAL | 0 refills | Status: AC

## 2019-08-21 NOTE — Progress Notes (Signed)
IVs removed before discharge. Went over discharge instructions with patient. All questions answered and patient stated that she understood. Patient going home POV with daughter.

## 2019-08-21 NOTE — TOC Transition Note (Signed)
Transition of Care [facility]) - CM/SW Discharge Note   Patient Details  Name: Natasha Dennis MRN: [id number] Date of Birth: 06-Jul-1965  [facility] CM/SW Contact:  Meriel Flavors, LCSW Phone Number: 08/21/2019, 3:34 PM   Clinical Narrative:    6/18: Patient being discharged home with HH/PT provided by [facility] and DME provided by [employer].       Barriers to Discharge: Continued Medical Work up   Patient Goals and CMS Choice Patient states their goals for this hospitalization and ongoing recovery are:: Get feeling better and get back home      Discharge Placement                       Discharge Plan and Services     Post Acute Care Choice: Home Health                               Social Determinants of Health (SDOH) Interventions     Readmission Risk Interventions No flowsheet data found.

## 2019-08-21 NOTE — Discharge Summary (Signed)
Physician Discharge Summary  Patient ID: Natasha Dennis MRN: [id number] DOB/AGE: 56/28/1967 56 y.o.  Admit date: 08/18/2019 Discharge date: 08/21/2019  Admission Diagnoses:  Discharge Diagnoses:  Active Problems:   GERD (gastroesophageal reflux disease)   Primary cancer of left breast with metastasis to other site (HCC)   Thrombocytopenia (HCC)   Pleural effusion   Anemia associated with chemotherapy   AKI (acute kidney injury) ([place])   Dehydration   Hypokalemia   Hypomagnesemia   Acute respiratory failure with hypoxia (HCC)   Atrial fibrillation with RVR (HCC)   Protein-calorie malnutrition, severe   Discharged Condition: good  Hospital Course:  Patient is a 54 year old female with history of metastatic breast cancer who present to the hospital with nausea vomiting and diarrhea. She was recently seen by oncology, her abemaciclibwas discontinued 2 days ago. Diarrhea has been better since came to the emergency room. She also had some hypoxemia with short of breath. Chest CT scan showed moderate right-sided pleural effusion. She had acute kidney injury with hypokalemia. She received fluids and potassium supplement.  6/16.  Patient had a thoracentesis performed yesterday, moved 500 mL of pleural fluid.  Initial study does not suggest any infection.  Cytology sent out.  6/17.  Patient developed atrial fibrillation with rapid ventricle response.  Received as needed IV metoprolol, initially placed on heparin, transitioned to Eliquis this morning.  Start diltiazem 180 mg daily.     #1.  Acute respiratory failure with hypoxemia.   Likely due to pleural effusion.  Echocardiogram still pending.  Is status post thoracentesis.  Hypoxemia much improved today.  Her sats are 96% on room air.  Will obtain home oxygen evaluation.  2.  Acute kidney injury secondary to dehydration. Condition improving.    3.  Hypokalemia. Improved.  4.  Atrial fibrillation with rapid ventricle  response. Anticoagulation started.  Continue diltiazem 180 mg daily.  Heart rate much better.  Telemetry still showing atrial fibrillation.  Discussed with cardiology, continue anticoagulation, patient may need cardioversion as outpatient if does not convert automatically.  5.  Thrombocytopenia and anemia. Likely due to cancer treatment.    B12 and iron level normal.  6.  Metastatic breast cancer.  Consults: Cardiology  Significant Diagnostic Studies: Echocardiogram still pending  CT ANGIOGRAPHY CHEST WITH CONTRAST  TECHNIQUE: Multidetector CT imaging of the chest was performed using the standard protocol during bolus administration of intravenous contrast. Multiplanar CT image reconstructions and MIPs were obtained to evaluate the vascular anatomy.  CONTRAST:  43mL OMNIPAQUE IOHEXOL 350 MG/ML SOLN  COMPARISON:  Chest x-ray 08/18/2019, CT chest 06/10/2019, PET CT 06/18/2018  FINDINGS: Cardiovascular: Satisfactory opacification of the pulmonary arteries to the segmental level. No evidence of pulmonary embolism. Nonaneurysmal aorta. No dissection. Cardiac size within normal limits. Small pericardial effusion.  Mediastinum/Nodes: Midline trachea. No thyroid mass. Left axillary adenopathy with nodes measuring up to 16 mm. Esophagus within normal limits  Lungs/Pleura: Moderate right pleural effusion. Patchy bilateral ground-glass density and septal thickening suspect for edema.  Upper Abdomen: No acute abnormality.  Small amount of ascites  Musculoskeletal: Post mastectomy and reconstruction changes on the left. Right breast implant. Widespread sclerosis involving the shoulder girdles, sternum, ribs and spine consistent with skeletal metastatic disease, progression since 06/10/2019 comparison CT  Review of the MIP images confirms the above findings.  IMPRESSION: 1. Negative for acute pulmonary embolus or aortic dissection. 2. Moderate right pleural effusion.   Small pericardial effusion. 3. Hazy bilateral lung densities with septal thickening suggestive of edema 4.  Mild left axillary adenopathy consistent with metastatic disease. Widespread skeletal sclerosis consistent with metastatic disease, progressed as compared with 06/10/2019. 5. Small amount of upper abdominal ascites   Electronically Signed   By: Donavan Foil M.D.   On: 08/18/2019 20:20  ULTRASOUND ABDOMEN LIMITED RIGHT UPPER QUADRANT  COMPARISON:  None.  FINDINGS: Gallbladder:  Gallbladder sludge and layering small stones are seen. Gallbladder wall is 3.3 mm. No sonographic Murphy sign noted by sonographer.  Common bile duct:  Diameter: 5.2 mm  Liver:  No focal lesion identified. Within normal limits in parenchymal echogenicity. Portal vein is patent on color Doppler imaging with normal direction of blood flow towards the liver.  Other: Incidentally noted is a small right pleural effusion. There is a small amount of perihepatic ascites. There is also mild right pelvicaliectasis present.  IMPRESSION: Sludge/gallbladder stones.  No evidence of acute cholecystitis.  Small amount of perihepatic ascites and right pleural effusion  Mild right hydronephrosis.   Treatments: Thoracentesis, anticoagulation,  Discharge Exam: Blood pressure 102/64, pulse 76, temperature 97.6 F (36.4 C), temperature source Oral, resp. rate 18, height 5\' 6"  (1.676 m), weight 79 kg, SpO2 98 %. General appearance: alert and cooperative Resp: clear to auscultation bilaterally Cardio: irregularly irregular rhythm GI: soft, non-tender; bowel sounds normal; no masses,  no organomegaly Extremities: extremities normal, atraumatic, no cyanosis or edema  Disposition: Discharge disposition: 01-Home or Self Care       Discharge Instructions    Diet - low sodium heart healthy   Complete by: As directed    Increase activity slowly   Complete by: As directed      Allergies  as of 08/21/2019      Reactions   Influenza Vaccines Swelling   Joint swelling, rash      Medication List    STOP taking these medications   abemaciclib 150 MG tablet Commonly known as: VERZENIO     TAKE these medications   ALPRAZolam 1 MG tablet Commonly known as: XANAX Take 1 tablet (1 mg total) by mouth at bedtime as needed for anxiety.   apixaban 5 MG Tabs tablet Commonly known as: ELIQUIS Take 1 tablet (5 mg total) by mouth 2 (two) times daily.   cholecalciferol 1000 units tablet Commonly known as: VITAMIN D Take 1,000 Units by mouth daily.   Dexilant 60 MG capsule Generic drug: dexlansoprazole Take 1 capsule (60 mg total) by mouth daily.   diltiazem 180 MG 24 hr capsule Commonly known as: CARDIZEM CD Take 1 capsule (180 mg total) by mouth daily. Start taking on: August 22, 2019   diphenoxylate-atropine 2.5-0.025 MG tablet Commonly known as: LOMOTIL Take 1 tablet by mouth 4 (four) times daily as needed for diarrhea or loose stools. Take it along with immodium   ergocalciferol 1.25 MG (50000 UT) capsule Commonly known as: VITAMIN D2 Take 1 capsule (50,000 Units total) by mouth once a week.   ondansetron 8 MG tablet Commonly known as: ZOFRAN Take one pill 30-60 mins prior to taking Verzinio to prevent nausea.   OSTEO BI-FLEX ONE PER DAY PO Take by mouth.   PARoxetine 20 MG tablet Commonly known as: PAXIL Take 1 tablet (20 mg total) by mouth daily.   prochlorperazine 10 MG tablet Commonly known as: COMPAZINE Take 1 tablet (10 mg total) by mouth every 6 (six) hours as needed for nausea or vomiting.   trimethoprim 100 MG tablet Commonly known as: TRIMPEX Take 1 tablet (100 mg total) by mouth daily.  Follow-up Information    Verl Bangs, FNP Follow up in 1 week(s).   Specialty: Family Medicine Contact information: [place] [address] [phone]        Isaias Cowman, MD Follow up in 2 week(s).   Specialty:  Cardiology Contact information: [facility] [place] [address] [phone]             35 minutes  Signed: Sharen Hones 08/21/2019, 12:09 PM

## 2019-08-21 NOTE — Progress Notes (Signed)
[facility] Cardiology    SUBJECTIVE: The patient reports that she slept well last night. She denies chest pain or recurrent palpitations. With ambulation in her room, she still experiences shortness of breath, but at rest and on nasal cannula, she is not having difficulty breathing.   Vitals:   08/20/19 1500 08/20/19 2008 08/20/19 2348 08/21/19 0408  BP: (!) 113/51 101/66 109/66 108/64  Pulse: 82 74 77 75  Resp: 20 19 19 19   Temp: 97.8 F (36.6 C) 98.1 F (36.7 C) 98.2 F (36.8 C) 98.2 F (36.8 C)  TempSrc: Oral     SpO2: 99% 100% 100% 99%  Weight:      Height:         Intake/Output Summary (Last 24 hours) at 08/21/2019 0837 Last data filed at 08/21/2019 0300 Gross per 24 hour  Intake --  Output 600 ml  Net -600 ml      PHYSICAL EXAM  General: Well developed, well nourished, in no acute distress, resting HEENT:  Normocephalic and atramatic Neck:  No JVD.  Lungs: normal effort of breathing on O2 via , decreased breath sounds right base, no wheezing Heart: irregularly irregular. Normal S1 and S2 without gallops or murmurs.  Abdomen: nondistended Msk:  Back normal, normal gait. Normal strength and tone for age. Extremities: Trace bilateral lower extremity edema.   Neuro: Alert and oriented X 3. Psych:  Good affect, responds appropriately   LABS: Basic Metabolic Panel: Recent Labs    08/19/19 0611 08/20/19 0027 08/20/19 0515 08/21/19 0351  NA 136   < > 135 134*  K 3.0*   < > 3.8 3.9  CL 105   < > 106 105  CO2 21*   < > 21* 19*  GLUCOSE 92   < > 106* 109*  BUN 24*   < > 23* 26*  CREATININE 1.58*   < > 1.41* 1.50*  CALCIUM 7.5*   < > 7.7* 7.8*  MG 2.2   < > 2.1 2.3  PHOS 4.4  --   --   --    < > = values in this interval not displayed.   Liver Function Tests: Recent Labs    08/18/19 1038 08/19/19 0611  AST 88* 93*  ALT 38 41  ALKPHOS 359* 337*  BILITOT 0.8 0.8  PROT 5.9* 5.2*  ALBUMIN 2.6* 2.4*   No results for input(s): LIPASE, AMYLASE in the last 72  hours. CBC: Recent Labs    08/19/19 0611 08/19/19 0611 08/20/19 0515 08/21/19 0351  WBC 9.6   < > 9.5 10.5  NEUTROABS 4.1  --  4.1  --   HGB 8.2*   < > 8.6* 8.7*  HCT 26.6*   < > 28.0* 28.1*  MCV 88.1   < > 89.2 90.1  PLT 126*   < > 115* 119*   < > = values in this interval not displayed.   Cardiac Enzymes: Recent Labs    08/19/19 0611  CKTOTAL 43   BNP: Invalid input(s): POCBNP D-Dimer: No results for input(s): DDIMER in the last 72 hours. Hemoglobin A1C: No results for input(s): HGBA1C in the last 72 hours. Fasting Lipid Panel: No results for input(s): CHOL, HDL, LDLCALC, TRIG, CHOLHDL, LDLDIRECT in the last 72 hours. Thyroid Function Tests: Recent Labs    08/19/19 0611  TSH 0.625   Anemia Panel: Recent Labs    08/19/19 0611  VITAMINB12 1,677*  FOLATE 7.3  FERRITIN 1,348*  TIBC 230*  IRON 61  RETICCTPCT  1.7    DG Chest 1 View  Result Date: 08/20/2019 CLINICAL DATA:  Atrial flutter EXAM: CHEST  1 VIEW COMPARISON:  08/19/2019 FINDINGS: Heart is normal size. Diffuse interstitial prominence throughout the lungs, unchanged. No effusions. Diffuse skeletal sclerotic metastasis noted as seen on prior CT. IMPRESSION: Diffuse interstitial prominence throughout the lungs, stable. Diffuse skeletal metastases. Electronically Signed   By: Rolm Baptise M.D.   On: 08/20/2019 02:48   DG Chest Port 1 View  Result Date: 08/19/2019 CLINICAL DATA:  Breast cancer. Mastectomy. Ex-smoker. Status post thoracentesis. EXAM: PORTABLE CHEST 1 VIEW COMPARISON:  08/18/2019 plain film and CTA. FINDINGS: Widespread sclerotic osseous metastasis. Midline trachea. Normal heart size and mediastinal contours. No pleural effusion or pneumothorax. No residual right-sided pleural effusion. Smooth septal thickening. No lobar consolidation. IMPRESSION: 1. No residual right-sided pleural effusion. No pneumothorax. 2. Interstitial thickening, likely related to mild pulmonary venous congestion.  Electronically Signed   By: Abigail Miyamoto M.D.   On: 08/19/2019 16:22   US THORACENTESIS ASP PLEURAL SPACE W/IMG GUIDE  Result Date: 08/19/2019 INDICATION: 54 year old female with small right-sided pleural effusion, history of lobular breast carcinoma EXAM: ULTRASOUND GUIDED RIGHT THORACENTESIS MEDICATIONS: None. COMPLICATIONS: None immediate. PROCEDURE: An ultrasound guided thoracentesis was thoroughly discussed with the patient and questions answered. The benefits, risks, alternatives and complications were also discussed. The patient understands and wishes to proceed with the procedure. Written consent was obtained. Ultrasound was performed to localize and mark an adequate pocket of fluid in the right chest. The area was then prepped and draped in the normal sterile fashion. 1% Lidocaine was used for local anesthesia. Under ultrasound guidance a 6 Fr Safe-T-Centesis catheter was introduced. Thoracentesis was performed. The catheter was removed and a dressing applied. FINDINGS: A total of approximately 400 mL of amber colored fluid was removed. Samples were sent to the laboratory as requested by the clinical team. IMPRESSION: Successful ultrasound guided right thoracentesis yielding 400 mL of pleural fluid. Electronically Signed   By: Jacqulynn Cadet M.D.   On: 08/19/2019 16:35     Echo Pending  TELEMETRY: atrial fibrillation, rates 70s-low 100s  ASSESSMENT AND PLAN:  Active Problems:   GERD (gastroesophageal reflux disease)   Primary cancer of left breast with metastasis to other site (HCC)   Thrombocytopenia (HCC)   Pleural effusion   Anemia associated with chemotherapy   AKI (acute kidney injury) ([place])   Dehydration   Hypokalemia   Hypomagnesemia   Acute respiratory failure with hypoxia (HCC)   Atrial fibrillation with RVR (HCC)   Protein-calorie malnutrition, severe    1. New onset atrial flutter/fibrillation in the setting of metastatic breast cancer, dehydration, and acute  hypoxic respiratory failure likely secondary to moderate pleural effusion, status post thoracentesis. She received IV metoprolol, and was started on Cardizem CD 120mg . Heart rate stable in the 80s-low 100s bpm. She has a chads vasc score of 1 (female). Started on Eliquis 5 mg BID despite low chads vasc score in anticipation for possible cardioversion as outpatient if patient does not convert to sinus rhythm. 2. Metastatic breast cancer 3. Acute respiratory failure likely secondary to moderate pleural effusion, improving  Recommendations: 1. Continue Eliquis 5 mg BID for stroke prevention 2. Continue Cardizem CD 120 mg daily for rate control 3. Review 2D echocardiogram 4. [place], PA-C 08/21/2019 8:37 AM

## 2019-08-21 NOTE — Progress Notes (Signed)
Physical Therapy Treatment Patient Details Name: Natasha Dennis MRN: [id number] DOB: Jun 09, 1965 Today's Date: 08/21/2019    History of Present Illness Patient is a pleasant 54 year old female who came to the ED from oncologist when she was found to be tachycardic and desaturating. PMh includes metastatic breast cancer with mets to bone marrow, GERD, thrombocytopenia, pleural effusion, anemia associated with chemotherapy, dehydration, AKI, hypomagnesemia, . Was hypoxic with CT scan showing moderate righ tside pleural effusion and acute kdiney injury with hypokalemia,    PT Comments    Pt in bed, on room air.  Needing to use bathoom.  OOB on her own and able to walk with slow gait.  Upon sitting on commode 84% on room air.  RN in and OK'ed to replace O2 at 2lpm and sats increased back to 97%.  She is able to walk 25' x 3 in room with sats stable with O2 and HR increasing up to 120.  Pt appears generally fatigued with task but willing to participate.  Session focused on education and energy conservation techniques and awareness.  She stated she does had a rollator walker at home (was her moms) and she was encouraged to use it at home for general safety and conservation especially for community ambulation.  She voiced understanding.     Follow Up Recommendations  Home health PT     Equipment Recommendations  None recommended by PT    Recommendations for Other Services       Precautions / Restrictions Precautions Precautions: None;Fall Restrictions Weight Bearing Restrictions: No    Mobility  Bed Mobility Overal bed mobility: Independent                Transfers Overall transfer level: Independent Equipment used: None             General transfer comment: no AD required, fatigued but able to transfer without LOB or assistance  Ambulation/Gait Ambulation/Gait assistance: Min guard Gait Distance (Feet): 30 Feet Assistive device: None Gait Pattern/deviations:  Step-through pattern;Narrow base of support;Decreased stride length Gait velocity: decreased   General Gait Details: O2 decreased to 84% on room air to bathroom.  O2 replaced at 2 lpm.   Stairs             Wheelchair Mobility    Modified Rankin (Stroke Patients Only)       Balance Overall balance assessment: Mild deficits observed, not formally tested                                          Cognition Arousal/Alertness: Awake/alert Behavior During Therapy: WFL for tasks assessed/performed Overall Cognitive Status: Within Functional Limits for tasks assessed                                        Exercises      General Comments        Pertinent Vitals/Pain Pain Assessment: No/denies pain    Home Living                      Prior Function            PT Goals (current goals can now be found in the care plan section) Progress towards PT goals: Progressing toward goals    Frequency  Min 2X/week      PT Plan Current plan remains appropriate    Co-evaluation              AM-PAC PT "6 Clicks" Mobility   Outcome Measure  Help needed turning from your back to your side while in a flat bed without using bedrails?: None Help needed moving from lying on your back to sitting on the side of a flat bed without using bedrails?: None Help needed moving to and from a bed to a chair (including a wheelchair)?: None Help needed standing up from a chair using your arms (e.g., wheelchair or bedside chair)?: None Help needed to walk in hospital room?: A Little Help needed climbing 3-5 steps with a railing? : A Little 6 Click Score: 22    End of Session Equipment Utilized During Treatment: Gait belt;Oxygen Activity Tolerance: Patient tolerated treatment well Patient left: in chair;with call bell/phone within reach;with chair alarm set Nurse Communication: Mobility status;Other (comment)       Time:  0940-7680 PT Time Calculation (min) (ACUTE ONLY): 13 min  Charges:  $Gait Training: 8-22 mins                    Chesley Noon, PTA 08/21/19, 11:36 AM

## 2019-08-21 NOTE — Progress Notes (Signed)
Natasha Dennis   DOB:[birth year]   WE#:[id number]    Subjective: Patient slept overnight.  Her heart rate has been better controlled.  Continues have shortness of breath on exertion.  No nausea no vomiting.  Poor appetite.  Mild cough.  Objective:  Vitals:   08/21/19 0904 08/21/19 1158  BP: 119/63 102/64  Pulse:  76  Resp:  18  Temp: 97.6 F (36.4 C) 97.6 F (36.4 C)  SpO2:  98%     Intake/Output Summary (Last 24 hours) at 08/21/2019 1636 Last data filed at 08/21/2019 0300 Gross per 24 hour  Intake --  Output 200 ml  Net -200 ml    Physical Exam HENT:     Head: Normocephalic and atraumatic.     Mouth/Throat:     Pharynx: No oropharyngeal exudate.  Eyes:     Pupils: Pupils are equal, round, and reactive to light.  Cardiovascular:     Rate and Rhythm: Normal rate and regular rhythm.  Pulmonary:     Effort: No respiratory distress.     Breath sounds: Decreased breath sounds present. No wheezing.  Abdominal:     General: Bowel sounds are normal. There is no distension.     Palpations: Abdomen is soft. There is no mass.     Tenderness: There is no abdominal tenderness. There is no guarding or rebound.  Musculoskeletal:        General: No tenderness. Normal range of motion.     Cervical back: Normal range of motion and neck supple.  Skin:    General: Skin is warm.  Neurological:     Mental Status: She is alert and oriented to person, place, and time.  Psychiatric:        Mood and Affect: Affect normal.      Labs:  Lab Results  Component Value Date   WBC 10.5 08/21/2019   HGB 8.7 (L) 08/21/2019   HCT 28.1 (L) 08/21/2019   MCV 90.1 08/21/2019   PLT 119 (L) 08/21/2019   NEUTROABS 4.1 08/20/2019    Lab Results  Component Value Date   NA 134 (L) 08/21/2019   K 3.9 08/21/2019   CL 105 08/21/2019   CO2 19 (L) 08/21/2019    Studies:  DG Chest 1 View  Result Date: 08/20/2019 CLINICAL DATA:  Atrial flutter EXAM: CHEST  1 VIEW COMPARISON:  08/19/2019 FINDINGS:  Heart is normal size. Diffuse interstitial prominence throughout the lungs, unchanged. No effusions. Diffuse skeletal sclerotic metastasis noted as seen on prior CT. IMPRESSION: Diffuse interstitial prominence throughout the lungs, stable. Diffuse skeletal metastases. Electronically Signed   By: Rolm Baptise M.D.   On: 08/20/2019 02:48   ECHOCARDIOGRAM COMPLETE  Result Date: 08/21/2019    ECHOCARDIOGRAM REPORT   Patient Name:   Natasha Dennis Date of Exam: 08/20/2019 Medical Rec #:  [id number]          Height:       66.0 in Accession #:    [id number]         Weight:       174.2 lb Date of Birth:  1966/02/16          BSA:          1.886 m Patient Age:    54 years           BP:           105/72 mmHg Patient Gender: F  HR:           101 bpm. Exam Location:  [facility] Procedure: 2D Echo, Color Doppler and Cardiac Doppler Indications:     Atrial Flutter 427.32  History:         Patient has no prior history of Echocardiogram examinations. No                  cardiac history listed in chart.  Sonographer:     Sherrie Sport RDCS (AE) Referring Phys:  [id number] BRENDA MORRISON Diagnosing Phys: Isaias Cowman MD IMPRESSIONS  1. Left ventricular ejection fraction, by estimation, is 55 to 60%. The left ventricle has normal function. The left ventricle has no regional wall motion abnormalities. There is mild left ventricular hypertrophy. Left ventricular diastolic parameters are indeterminate.  2. Right ventricular systolic function is normal. The right ventricular size is normal. There is normal pulmonary artery systolic pressure.  3. The mitral valve is normal in structure. Mild mitral valve regurgitation. No evidence of mitral stenosis.  4. The aortic valve is normal in structure. Aortic valve regurgitation is not visualized. No aortic stenosis is present.  5. The inferior vena cava is normal in size with greater than 50% respiratory variability, suggesting right atrial pressure of 3 mmHg. FINDINGS  Left  Ventricle: Left ventricular ejection fraction, by estimation, is 55 to 60%. The left ventricle has normal function. The left ventricle has no regional wall motion abnormalities. The left ventricular internal cavity size was normal in size. There is  mild left ventricular hypertrophy. Left ventricular diastolic parameters are indeterminate. Right Ventricle: The right ventricular size is normal. No increase in right ventricular wall thickness. Right ventricular systolic function is normal. There is normal pulmonary artery systolic pressure. The tricuspid regurgitant velocity is 1.90 m/s, and  with an assumed right atrial pressure of 10 mmHg, the estimated right ventricular systolic pressure is 38.2 mmHg. Left Atrium: Left atrial size was normal in size. Right Atrium: Right atrial size was normal in size. Pericardium: Trivial pericardial effusion is present. Mitral Valve: The mitral valve is normal in structure. Normal mobility of the mitral valve leaflets. Mild mitral valve regurgitation. No evidence of mitral valve stenosis. Tricuspid Valve: The tricuspid valve is normal in structure. Tricuspid valve regurgitation is mild . No evidence of tricuspid stenosis. Aortic Valve: The aortic valve is normal in structure. Aortic valve regurgitation is not visualized. No aortic stenosis is present. Aortic valve mean gradient measures 3.5 mmHg. Aortic valve peak gradient measures 6.2 mmHg. Aortic valve area, by VTI measures 2.28 cm. Pulmonic Valve: The pulmonic valve was normal in structure. Pulmonic valve regurgitation is not visualized. No evidence of pulmonic stenosis. Aorta: The aortic root is normal in size and structure. Venous: The inferior vena cava is normal in size with greater than 50% respiratory variability, suggesting right atrial pressure of 3 mmHg. IAS/Shunts: No atrial level shunt detected by color flow Doppler.  LEFT VENTRICLE PLAX 2D LVIDd:         3.90 cm  Diastology LVIDs:         2.90 cm  LV e' lateral:    8.49 cm/s LV PW:         1.01 cm  LV E/e' lateral: 6.7 LV IVS:        0.88 cm  LV e' medial:    4.24 cm/s LVOT diam:     2.00 cm  LV E/e' medial:  13.4 LV SV:         43 LV  SV Index:   23 LVOT Area:     3.14 cm  RIGHT VENTRICLE RV Basal diam:  2.77 cm LEFT ATRIUM              Index       RIGHT ATRIUM           Index LA diam:        3.70 cm  1.96 cm/m  RA Area:     18.30 cm LA Vol (A2C):   123.0 ml 65.23 ml/m RA Volume:   49.90 ml  26.46 ml/m LA Vol (A4C):   70.6 ml  37.44 ml/m LA Biplane Vol: 101.0 ml 53.56 ml/m  AORTIC VALVE                   PULMONIC VALVE AV Area (Vmax):    2.10 cm    PV Vmax:        0.88 m/s AV Area (Vmean):   2.02 cm    PV Peak grad:   3.1 mmHg AV Area (VTI):     2.28 cm    RVOT Peak grad: 4 mmHg AV Vmax:           124.00 cm/s AV Vmean:          87.900 cm/s AV VTI:            0.188 m AV Peak Grad:      6.2 mmHg AV Mean Grad:      3.5 mmHg LVOT Vmax:         82.90 cm/s LVOT Vmean:        56.500 cm/s LVOT VTI:          0.136 m LVOT/AV VTI ratio: 0.73  AORTA Ao Root diam: 3.00 cm MITRAL VALVE               TRICUSPID VALVE MV Area (PHT): 7.44 cm    TR Peak grad:   14.4 mmHg MV Decel Time: 102 msec    TR Vmax:        190.00 cm/s MV E velocity: 57.00 cm/s MV A velocity: 78.00 cm/s  SHUNTS MV E/A ratio:  0.73        Systemic VTI:  0.14 m                            Systemic Diam: 2.00 cm Isaias Cowman MD Electronically signed by Isaias Cowman MD Signature Date/Time: 08/21/2019/1:13:15 PM    Final     Primary cancer of left breast with metastasis to other site [facility]) #54 year old female patient with a history of Metastatic breast carcinoma-involving the bone marrow/bones/GI tract involvement-is currently admitted to hospital for worsening shortness of breath/nausea vomiting diarrhea/acute renal failure  #Acute hypoxic respiratory failure-likely secondary to progressive right pleural effusion/question lymphangitic spread; clinically suspicious for malignant effusion-status post  thoracentesis; cytology positive for breast cancer.  #A. fib with RVR--on Eliquis rate controlled; Cardizem.  #Metastatic breast cancer ER/PR positive-bone marrow/bones/GI involvement- currently on Faslodex; discontinued abemaciclib secondary to nausea vomiting prerenal/acute renal failure.  Discussed regarding possible use of chemotherapy given the poor tolerance to current therapy; also progression of disease.   #Acute renal failure-creatinine 1.5 baseline 0.9; monitor closely.  #Discussed with the patient about plan of care; will reach out to patient daughter Crystal.   Cammie Sickle, MD 08/21/2019  4:36 PM

## 2019-08-21 NOTE — Discharge Instructions (Signed)
Follow-up with PCP in 1 week. Follow-up with oncology as scheduled. Follow-up with cardiology in 2 weeks

## 2019-08-21 NOTE — Progress Notes (Signed)
START ON PATHWAY REGIMEN - Breast     A cycle is every 28 days (3 weeks on and 1 week off):     Paclitaxel   **Always confirm dose/schedule in your pharmacy ordering system**  Patient Characteristics: Distant Metastases or Locoregional Recurrent Disease - Unresected or Locally Advanced Unresectable Disease Progressing after Neoadjuvant and Local Therapies, HER2 Negative/Unknown/Equivocal, ER Positive, Chemotherapy, First Line Therapeutic Status: Distant Metastases BRCA Mutation Status: Absent ER Status: Positive (+) HER2 Status: Negative (-) PR Status: Positive (+) Line of Therapy: First Line Intent of Therapy: Non-Curative / Palliative Intent, Discussed with Patient

## 2019-08-21 NOTE — Progress Notes (Signed)
SATURATION QUALIFICATIONS: (This note is used to comply with regulatory documentation for home oxygen)  Patient Saturations on Room Air at Rest = 88%  Patient Saturations on Room Air while Ambulating = 84%  Patient Saturations on 2 Liters of oxygen while Ambulating = 96%  Please briefly explain why patient needs home oxygen: Patients O2 sats drop when she is ambulating or sitting with just RA. Patient does need oxygen for home.

## 2019-08-22 ENCOUNTER — Telehealth: Payer: Self-pay | Admitting: Internal Medicine

## 2019-08-22 LAB — CANCER ANTIGEN 27.29: CA 27.29: 436.7 U/mL — ABNORMAL HIGH (ref 0.0–38.6)

## 2019-08-22 LAB — CEA: CEA: 449 ng/mL — ABNORMAL HIGH (ref 0.0–4.7)

## 2019-08-22 LAB — CANCER ANTIGEN 15-3: CA 15-3: 123 U/mL — ABNORMAL HIGH (ref 0.0–25.0)

## 2019-08-22 NOTE — Telephone Encounter (Signed)
On 6/18- spoke to pt's daughter re: progression of her cancer on current therapy. Need to switch to chemotherapy.  C- Mid next week-Please schedule MD; labs- cbc/cmp; possibleTaxol chemo.

## 2019-08-23 LAB — BODY FLUID CULTURE: Culture: NO GROWTH

## 2019-08-24 ENCOUNTER — Inpatient Hospital Stay: Payer: No Typology Code available for payment source

## 2019-08-24 ENCOUNTER — Encounter: Payer: Self-pay | Admitting: Internal Medicine

## 2019-08-24 ENCOUNTER — Encounter: Payer: Self-pay | Admitting: Oncology

## 2019-08-24 ENCOUNTER — Other Ambulatory Visit: Payer: Self-pay | Admitting: *Deleted

## 2019-08-24 ENCOUNTER — Other Ambulatory Visit: Payer: Self-pay

## 2019-08-24 ENCOUNTER — Inpatient Hospital Stay (HOSPITAL_BASED_OUTPATIENT_CLINIC_OR_DEPARTMENT_OTHER): Payer: No Typology Code available for payment source | Admitting: Oncology

## 2019-08-24 VITALS — BP 109/65 | HR 91 | Temp 99.0°F | Resp 20

## 2019-08-24 DIAGNOSIS — E860 Dehydration: Secondary | ICD-10-CM

## 2019-08-24 DIAGNOSIS — C7951 Secondary malignant neoplasm of bone: Secondary | ICD-10-CM

## 2019-08-24 DIAGNOSIS — Z7901 Long term (current) use of anticoagulants: Secondary | ICD-10-CM | POA: Diagnosis not present

## 2019-08-24 DIAGNOSIS — Z9221 Personal history of antineoplastic chemotherapy: Secondary | ICD-10-CM | POA: Diagnosis not present

## 2019-08-24 DIAGNOSIS — E871 Hypo-osmolality and hyponatremia: Secondary | ICD-10-CM | POA: Diagnosis not present

## 2019-08-24 DIAGNOSIS — C50912 Malignant neoplasm of unspecified site of left female breast: Secondary | ICD-10-CM

## 2019-08-24 DIAGNOSIS — Z87440 Personal history of urinary (tract) infections: Secondary | ICD-10-CM | POA: Diagnosis not present

## 2019-08-24 DIAGNOSIS — C7952 Secondary malignant neoplasm of bone marrow: Secondary | ICD-10-CM | POA: Diagnosis not present

## 2019-08-24 DIAGNOSIS — Z9012 Acquired absence of left breast and nipple: Secondary | ICD-10-CM | POA: Diagnosis not present

## 2019-08-24 DIAGNOSIS — K219 Gastro-esophageal reflux disease without esophagitis: Secondary | ICD-10-CM | POA: Diagnosis not present

## 2019-08-24 DIAGNOSIS — Z5111 Encounter for antineoplastic chemotherapy: Secondary | ICD-10-CM | POA: Diagnosis not present

## 2019-08-24 DIAGNOSIS — Z170 Estrogen receptor positive status [ER+]: Secondary | ICD-10-CM | POA: Diagnosis not present

## 2019-08-24 DIAGNOSIS — Z79811 Long term (current) use of aromatase inhibitors: Secondary | ICD-10-CM | POA: Diagnosis not present

## 2019-08-24 DIAGNOSIS — R630 Anorexia: Secondary | ICD-10-CM | POA: Diagnosis not present

## 2019-08-24 DIAGNOSIS — Z923 Personal history of irradiation: Secondary | ICD-10-CM | POA: Diagnosis not present

## 2019-08-24 DIAGNOSIS — Z79899 Other long term (current) drug therapy: Secondary | ICD-10-CM | POA: Diagnosis not present

## 2019-08-24 DIAGNOSIS — Z87891 Personal history of nicotine dependence: Secondary | ICD-10-CM | POA: Diagnosis not present

## 2019-08-24 DIAGNOSIS — R531 Weakness: Secondary | ICD-10-CM

## 2019-08-24 LAB — COMPREHENSIVE METABOLIC PANEL
ALT: 39 U/L (ref 0–44)
AST: 104 U/L — ABNORMAL HIGH (ref 15–41)
Albumin: 2.4 g/dL — ABNORMAL LOW (ref 3.5–5.0)
Alkaline Phosphatase: 384 U/L — ABNORMAL HIGH (ref 38–126)
Anion gap: 10 (ref 5–15)
BUN: 23 mg/dL — ABNORMAL HIGH (ref 6–20)
CO2: 21 mmol/L — ABNORMAL LOW (ref 22–32)
Calcium: 7.9 mg/dL — ABNORMAL LOW (ref 8.9–10.3)
Chloride: 95 mmol/L — ABNORMAL LOW (ref 98–111)
Creatinine, Ser: 1.25 mg/dL — ABNORMAL HIGH (ref 0.44–1.00)
GFR calc Af Amer: 56 mL/min — ABNORMAL LOW (ref >60)
GFR calc non Af Amer: 48 mL/min — ABNORMAL LOW (ref >60)
Glucose, Bld: 104 mg/dL — ABNORMAL HIGH (ref 70–99)
Potassium: 4.3 mmol/L (ref 3.5–5.1)
Sodium: 126 mmol/L — ABNORMAL LOW (ref 135–145)
Total Bilirubin: 0.9 mg/dL (ref 0.3–1.2)
Total Protein: 5.7 g/dL — ABNORMAL LOW (ref 6.5–8.1)

## 2019-08-24 LAB — CBC WITH DIFFERENTIAL/PLATELET
Abs Immature Granulocytes: 0.24 10*3/uL — ABNORMAL HIGH (ref 0.00–0.07)
Basophils Absolute: 0.1 10*3/uL (ref 0.0–0.1)
Basophils Relative: 1 %
Eosinophils Absolute: 0 10*3/uL (ref 0.0–0.5)
Eosinophils Relative: 0 %
HCT: 28.3 % — ABNORMAL LOW (ref 36.0–46.0)
Hemoglobin: 8.9 g/dL — ABNORMAL LOW (ref 12.0–15.0)
Immature Granulocytes: 2 %
Lymphocytes Relative: 41 %
Lymphs Abs: 4.7 10*3/uL — ABNORMAL HIGH (ref 0.7–4.0)
MCH: 27.4 pg (ref 26.0–34.0)
MCHC: 31.4 g/dL (ref 30.0–36.0)
MCV: 87.1 fL (ref 80.0–100.0)
Monocytes Absolute: 0.8 10*3/uL (ref 0.1–1.0)
Monocytes Relative: 7 %
Neutro Abs: 5.7 10*3/uL (ref 1.7–7.7)
Neutrophils Relative %: 49 %
Platelets: 113 10*3/uL — ABNORMAL LOW (ref 150–400)
RBC: 3.25 MIL/uL — ABNORMAL LOW (ref 3.87–5.11)
RDW: 25.4 % — ABNORMAL HIGH (ref 11.5–15.5)
WBC: 11.7 10*3/uL — ABNORMAL HIGH (ref 4.0–10.5)
nRBC: 2.2 % — ABNORMAL HIGH (ref 0.0–0.2)

## 2019-08-24 MED ORDER — SODIUM CHLORIDE 0.9 % IV SOLN
Freq: Once | INTRAVENOUS | Status: AC
  Filled 2019-08-24: qty 250

## 2019-08-24 MED ORDER — DEXAMETHASONE SODIUM PHOSPHATE 10 MG/ML IJ SOLN
10.0000 mg | Freq: Once | INTRAMUSCULAR | Status: AC
  Administered 2019-08-24: 10 mg via INTRAVENOUS
  Filled 2019-08-24: qty 1

## 2019-08-24 NOTE — Progress Notes (Signed)
Symptom Management Consult note [facility]  [phone] [phone]  Patient Care Team: Verl Bangs, FNP as PCP - General (Family Medicine) Cammie Sickle, MD as Consulting Physician (Internal Medicine) Lucilla Lame, MD as Consulting Physician (Gastroenterology) Hollice Espy, MD as Consulting Physician (Urology) Alfonzo Feller, RN as [facility] Management   Name of the patient: Natasha Dennis  [id number]  [birth year]   Date of visit: 08/24/2019   Diagnosis- Breast Cancer   Chief complaint/ Reason for visit- Mouth sores/weakness/fatigue  Heme/Onc history:  Oncology History Overview Note  #  May 2010-stage II lobular cancer left breast; [Dr.Choksi] on a protocol received neoadjuvant chemotherapy s/p mastectomy reconstruction/TRAM; right breast augmentation.  Initially tamoxifen until July 2012-then Femara 2.5 mg; stopped because of joint pains; July 2013-Aromasin again stopped because of joint pains [total of 4 years-endocrine therapy].  #Bilateral salpingo-oophorectomy.   #March/April 2021-thrombocytopenia/worsening anemia-bone marrow biopsy-"carcinoma".  ER/PR status HER-2/neu status-NA; March 2021 abdomen pelvis CT scan-vague infiltrative lesion noted duodenal area; June 08, 2019-PET scan left axilla adenopathy; multiple bone lesions.   #April 2021-left axial lymph node biopsy; positive for breast carcinoma; NGS pending.    # 20th April 2021-Faslodex  # NGS/MOLECULAR TESTS:    # PALLIATIVE CARE EVALUATION:  # PAIN MANAGEMENT:    DIAGNOSIS:   STAGE:         ;  GOALS:  CURRENT/MOST RECENT THERAPY :     Primary cancer of left breast with metastasis to other site [facility])  07/05/2008 Initial Diagnosis   History is obtained through chart review from Dr. Metro Kung notes. She was originally diagnosed with breast cancer, invasive lobular carcinoma in May 2010. She was placed on the protocol and  received neoadjuvant chemotherapy. She had left mastectomy with reconstruction surgery and right breast augmentation with implant. Subsequently she was placed on tamoxifen until July of 2012, then Femara 2.5 mg.  Femara was stopped because of bony pains July of 2013. She was then switched to Aromasin. Patient has stopped taking Aromasin because of perceived side effect. In total, she had received anti-estrogen therapy for almost 4 years   10/17/2010 Surgery   She had bilateral salpingo-oophorectomy   01/21/2016 Imaging   No MRI evidence of malignancy in the right breast or reconstructed left breast (TRAM flap).   08/25/2019 -  Chemotherapy   The patient had PACLitaxel (TAXOL) 156 mg in sodium chloride 0.9 % 250 mL chemo infusion (</= 26m/m2), 80 mg/m2, Intravenous,  Once, 0 of 4 cycles  for chemotherapy treatment.     Interval history-Natasha Dennis a 54year old female with past medical history significant for A. fib with RVR, acute respiratory failure, GERD, osteopenia, AKI, obesity with recent diagnosis of stage IV metastatic breast cancer to the bone.  She was initially diagnosed back in 2010 with stage II disease.  Underwent neoadjuvant chemotherapy/mastectomy and reconstructive surgery.  She received 4 years of endocrine therapy which ultimately discontinued secondary to joint pain.  She was found to have an infiltrative lesion in the duodenal area on CT scan from March 2021.  PET scan in April 2021 confirmed a left axillary adenopathy with multiple bone lesions.  Underwent lymph node biopsy which is positive for breast carcinoma.  She was started on abemaciclib but unfortunately had progressive nausea, vomiting and diarrhea with weakness and shortness of breath. Abemaciclib was discontinued.  Plan was to begin single agent Taxol this week.  [facility],  poor oral intake and exertional dyspnea.  She was taken to the emergency room for further work-up and to  rule out PE.  She was found to have a pleural effusion and 500 mL of pleural fluid was removed positive  and also found to be in A. fib with RVR.  She was discharged on 08/21/19 on Eliquis and diltiazem.  Today, patient presents to symptom management for persistent weakness, decreased oral intake and mouth sores and bleeding impairing her ability to eat and drink.  Endorses feeling better since discharge with more energy and sleeping less.  Appetite is still poor but she is able to drink water.  She has trouble eating solid foods due to mouth sensitivity and "metallic taste".  She continues to be O2 dependent and notes a dramatic drop in her saturation levels with exertion and with removal of nasal cannula.  Family is concerned about her performance status.  They are here to discuss plan of care moving forward.  ECOG FS:3 - Symptomatic, >50% confined to bed  Review of systems- Review of Systems  Constitutional: Positive for malaise/fatigue. Negative for chills, fever and weight loss.  HENT: Negative for congestion, ear pain and tinnitus.   Eyes: Negative.  Negative for blurred vision and double vision.  Respiratory: Positive for shortness of breath. Negative for cough and sputum production.   Cardiovascular: Negative.  Negative for chest pain, palpitations and leg swelling.  Gastrointestinal: Negative.  Negative for abdominal pain, constipation, diarrhea, nausea and vomiting.  Genitourinary: Negative for dysuria, frequency and urgency.  Musculoskeletal: Negative for back pain and falls.  Skin: Negative.  Negative for rash.  Neurological: Positive for weakness. Negative for headaches.  Endo/Heme/Allergies: Negative.  Does not bruise/bleed easily.  Psychiatric/Behavioral: Negative.  Negative for depression. The patient is not nervous/anxious and does not have insomnia.     Current treatment- Faslodex- recent d/c of abemaciclib  Allergies  Allergen Reactions  . Influenza Vaccines Swelling     Joint swelling, rash     Past Medical History:  Diagnosis Date  . Breast cancer ([place]) 03-17-2009   lt Mastectomy and chemo  . Cancer ([place])   . GERD (gastroesophageal reflux disease)   . History of bone marrow biopsy 06/03/2019  . Neoplastic malignant related fatigue      Past Surgical History:  Procedure Laterality Date  . ABDOMINAL HYSTERECTOMY    . AUGMENTATION MAMMAPLASTY Right    RT BREAST IMPLANT ONLY  . ESOPHAGOGASTRODUODENOSCOPY (EGD) WITH PROPOFOL N/A 07/17/2019   Procedure: ESOPHAGOGASTRODUODENOSCOPY (EGD) WITH PROPOFOL;  Surgeon: Lucilla Lame, MD;  Location: [facility] ENDOSCOPY;  Service: Endoscopy;  Laterality: N/A;  . MASTECTOMY Left 2011  . OOPHORECTOMY      Social History   Socioeconomic History  . Marital status: Married    Spouse name: Not on file  . Number of children: Not on file  . Years of education: Not on file  . Highest education level: Not on file  Occupational History  . Not on file  Tobacco Use  . Smoking status: Former Smoker    Quit date: 03/05/1984    Years since quitting: 35.4  . Smokeless tobacco: Never Used  Vaping Use  . Vaping Use: Never used  Substance and Sexual Activity  . Alcohol use: No  . Drug use: No  . Sexual activity: Not on file  Other Topics Concern  . Not on file  Social History Narrative  . Not on file   Social Determinants of Health   Financial Resource Strain:   .  Difficulty of Paying Living Expenses:   Food Insecurity:   . Worried About Charity fundraiser in the Last Year:   . Arboriculturist in the Last Year:   Transportation Needs:   . Film/video editor (Medical):   Marland Kitchen Lack of Transportation (Non-Medical):   Physical Activity:   . Days of Exercise per Week:   . Minutes of Exercise per Session:   Stress:   . Feeling of Stress :   Social Connections:   . Frequency of Communication with Friends and Family:   . Frequency of Social Gatherings with Friends and Family:   . Attends Religious Services:   .  Active Member of Clubs or Organizations:   . Attends Archivist Meetings:   Marland Kitchen Marital Status:   Intimate Partner Violence:   . Fear of Current or Ex-Partner:   . Emotionally Abused:   Marland Kitchen Physically Abused:   . Sexually Abused:     Family History  Problem Relation Age of Onset  . Diabetes Mother   . Arthritis Mother   . Heart disease Father   . Breast cancer Neg Hx      Current Outpatient Medications:  .  ALPRAZolam (XANAX) 1 MG tablet, Take 1 tablet (1 mg total) by mouth at bedtime as needed for anxiety., Disp: 30 tablet, Rfl: 0 .  apixaban (ELIQUIS) 5 MG TABS tablet, Take 1 tablet (5 mg total) by mouth 2 (two) times daily., Disp: 60 tablet, Rfl: 0 .  Boswellia-Glucosamine-Vit D (OSTEO BI-FLEX ONE PER DAY PO), Take by mouth., Disp: , Rfl:  .  cholecalciferol (VITAMIN D) 1000 units tablet, Take 1,000 Units by mouth daily., Disp: , Rfl:  .  dexlansoprazole (DEXILANT) 60 MG capsule, Take 1 capsule (60 mg total) by mouth daily., Disp: 30 capsule, Rfl: 11 .  diltiazem (CARDIZEM CD) 180 MG 24 hr capsule, Take 1 capsule (180 mg total) by mouth daily., Disp: 30 capsule, Rfl: 0 .  diphenoxylate-atropine (LOMOTIL) 2.5-0.025 MG tablet, Take 1 tablet by mouth 4 (four) times daily as needed for diarrhea or loose stools. Take it along with immodium, Disp: 30 tablet, Rfl: 0 .  ergocalciferol (VITAMIN D2) 1.25 MG (50000 UT) capsule, Take 1 capsule (50,000 Units total) by mouth once a week., Disp: 12 capsule, Rfl: 1 .  ondansetron (ZOFRAN) 8 MG tablet, Take one pill 30-60 mins prior to taking Verzinio to prevent nausea., Disp: 40 tablet, Rfl: 1 .  PARoxetine (PAXIL) 20 MG tablet, Take 1 tablet (20 mg total) by mouth daily., Disp: 90 tablet, Rfl: 1 .  prochlorperazine (COMPAZINE) 10 MG tablet, Take 1 tablet (10 mg total) by mouth every 6 (six) hours as needed for nausea or vomiting., Disp: 30 tablet, Rfl: 3 .  trimethoprim (TRIMPEX) 100 MG tablet, Take 1 tablet (100 mg total) by mouth daily.,  Disp: 90 tablet, Rfl: 0  Physical exam: There were no vitals filed for this visit. Physical Exam Constitutional:      Appearance: Normal appearance.  HENT:     Head: Normocephalic and atraumatic.  Eyes:     Pupils: Pupils are equal, round, and reactive to light.  Cardiovascular:     Rate and Rhythm: Normal rate and regular rhythm.     Heart sounds: Normal heart sounds. No murmur heard.   Pulmonary:     Effort: Pulmonary effort is normal.     Breath sounds: Normal breath sounds. No wheezing.  Abdominal:     General: Bowel sounds are normal.  There is no distension.     Palpations: Abdomen is soft.     Tenderness: There is no abdominal tenderness.  Musculoskeletal:        General: Normal range of motion.     Cervical back: Normal range of motion.  Skin:    General: Skin is warm and dry.     Findings: No rash.  Neurological:     Mental Status: She is alert and oriented to person, place, and time.  Psychiatric:        Judgment: Judgment normal.      CMP Latest Ref Rng & Units 08/21/2019  Glucose 70 - 99 mg/dL 109(H)  BUN 6 - 20 mg/dL 26(H)  Creatinine 0.44 - 1.00 mg/dL 1.50(H)  Sodium 135 - 145 mmol/L 134(L)  Potassium 3.5 - 5.1 mmol/L 3.9  Chloride 98 - 111 mmol/L 105  CO2 22 - 32 mmol/L 19(L)  Calcium 8.9 - 10.3 mg/dL 7.8(L)  Total Protein 6.5 - 8.1 g/dL -  Total Bilirubin 0.3 - 1.2 mg/dL -  Alkaline Phos 38 - 126 U/L -  AST 15 - 41 U/L -  ALT 0 - 44 U/L -   CBC Latest Ref Rng & Units 08/21/2019  WBC 4.0 - 10.5 K/uL 10.5  Hemoglobin 12.0 - 15.0 g/dL 8.7(L)  Hematocrit 36 - 46 % 28.1(L)  Platelets 150 - 400 K/uL 119(L)    No images are attached to the encounter.  DG Chest 1 View  Result Date: 08/20/2019 CLINICAL DATA:  Atrial flutter EXAM: CHEST  1 VIEW COMPARISON:  08/19/2019 FINDINGS: Heart is normal size. Diffuse interstitial prominence throughout the lungs, unchanged. No effusions. Diffuse skeletal sclerotic metastasis noted as seen on prior CT. IMPRESSION:  Diffuse interstitial prominence throughout the lungs, stable. Diffuse skeletal metastases. Electronically Signed   By: Rolm Baptise M.D.   On: 08/20/2019 02:48   DG Chest 2 View  Result Date: 08/18/2019 CLINICAL DATA:  Dyspnea on exertion for 2 weeks, history of metastatic breast cancer EXAM: CHEST - 2 VIEW COMPARISON:  06/10/2019 FINDINGS: Frontal and lateral views of the chest demonstrate extensive diffuse sclerotic bony metastases unchanged since recent chest CT. Minimal blunting of the right costophrenic angle may reflect small effusion. No airspace disease or pneumothorax. Cardiac silhouette is unremarkable. IMPRESSION: 1. Trace right pleural effusion. 2. Extensive sclerotic bony metastases. Electronically Signed   By: Randa Ngo M.D.   On: 08/18/2019 15:38   CT Head Wo Contrast  Result Date: 08/18/2019 CLINICAL DATA:  Headache history of metastatic breast cancer EXAM: CT HEAD WITHOUT CONTRAST TECHNIQUE: Contiguous axial images were obtained from the base of the skull through the vertex without intravenous contrast. COMPARISON:  CT brain 11/18/2010 FINDINGS: Brain: No acute territorial infarction, hemorrhage or intracranial mass. The ventricles are nonenlarged. Small chronic infarct in the right cerebellum. Stable ventricle size. Vascular: No hyperdense vessels.  No unexpected calcification Skull: Normal. Negative for fracture or focal lesion. Sinuses/Orbits: No acute finding. Other: None IMPRESSION: Negative.  No CT evidence for acute intracranial abnormality Electronically Signed   By: Donavan Foil M.D.   On: 08/18/2019 20:11   CT Angio Chest PE W and/or Wo Contrast  Result Date: 08/18/2019 CLINICAL DATA:  Shortness of breath history of breast cancer EXAM: CT ANGIOGRAPHY CHEST WITH CONTRAST TECHNIQUE: Multidetector CT imaging of the chest was performed using the standard protocol during bolus administration of intravenous contrast. Multiplanar CT image reconstructions and MIPs were obtained  to evaluate the vascular anatomy. CONTRAST:  22m OMNIPAQUE IOHEXOL  350 MG/ML SOLN COMPARISON:  Chest x-ray 08/18/2019, CT chest 06/10/2019, PET CT 06/18/2018 FINDINGS: Cardiovascular: Satisfactory opacification of the pulmonary arteries to the segmental level. No evidence of pulmonary embolism. Nonaneurysmal aorta. No dissection. Cardiac size within normal limits. Small pericardial effusion. Mediastinum/Nodes: Midline trachea. No thyroid mass. Left axillary adenopathy with nodes measuring up to 16 mm. Esophagus within normal limits Lungs/Pleura: Moderate right pleural effusion. Patchy bilateral ground-glass density and septal thickening suspect for edema. Upper Abdomen: No acute abnormality.  Small amount of ascites Musculoskeletal: Post mastectomy and reconstruction changes on the left. Right breast implant. Widespread sclerosis involving the shoulder girdles, sternum, ribs and spine consistent with skeletal metastatic disease, progression since 06/10/2019 comparison CT Review of the MIP images confirms the above findings. IMPRESSION: 1. Negative for acute pulmonary embolus or aortic dissection. 2. Moderate right pleural effusion.  Small pericardial effusion. 3. Hazy bilateral lung densities with septal thickening suggestive of edema 4. Mild left axillary adenopathy consistent with metastatic disease. Widespread skeletal sclerosis consistent with metastatic disease, progressed as compared with 06/10/2019. 5. Small amount of upper abdominal ascites Electronically Signed   By: Donavan Foil M.D.   On: 08/18/2019 20:20   DG Chest Port 1 View  Result Date: 08/19/2019 CLINICAL DATA:  Breast cancer. Mastectomy. Ex-smoker. Status post thoracentesis. EXAM: PORTABLE CHEST 1 VIEW COMPARISON:  08/18/2019 plain film and CTA. FINDINGS: Widespread sclerotic osseous metastasis. Midline trachea. Normal heart size and mediastinal contours. No pleural effusion or pneumothorax. No residual right-sided pleural effusion. Smooth  septal thickening. No lobar consolidation. IMPRESSION: 1. No residual right-sided pleural effusion. No pneumothorax. 2. Interstitial thickening, likely related to mild pulmonary venous congestion. Electronically Signed   By: Abigail Miyamoto M.D.   On: 08/19/2019 16:22   ECHOCARDIOGRAM COMPLETE  Result Date: 08/21/2019    ECHOCARDIOGRAM REPORT   Patient Name:   DANISHA BRASSFIELD Date of Exam: 08/20/2019 Medical Rec #:  [id number]          Height:       66.0 in Accession #:    [id number]         Weight:       174.2 lb Date of Birth:  Jan 11, 1966          BSA:          1.886 m Patient Age:    87 years           BP:           105/72 mmHg Patient Gender: F                  HR:           101 bpm. Exam Location:  [facility] Procedure: 2D Echo, Color Doppler and Cardiac Doppler Indications:     Atrial Flutter 427.32  History:         Patient has no prior history of Echocardiogram examinations. No                  cardiac history listed in chart.  Sonographer:     Sherrie Sport RDCS (AE) Referring Phys:  [id number] BRENDA MORRISON Diagnosing Phys: Isaias Cowman MD IMPRESSIONS  1. Left ventricular ejection fraction, by estimation, is 55 to 60%. The left ventricle has normal function. The left ventricle has no regional wall motion abnormalities. There is mild left ventricular hypertrophy. Left ventricular diastolic parameters are indeterminate.  2. Right ventricular systolic function is normal. The right ventricular size is normal. There is normal pulmonary artery systolic  pressure.  3. The mitral valve is normal in structure. Mild mitral valve regurgitation. No evidence of mitral stenosis.  4. The aortic valve is normal in structure. Aortic valve regurgitation is not visualized. No aortic stenosis is present.  5. The inferior vena cava is normal in size with greater than 50% respiratory variability, suggesting right atrial pressure of 3 mmHg. FINDINGS  Left Ventricle: Left ventricular ejection fraction, by estimation, is 55 to 60%.  The left ventricle has normal function. The left ventricle has no regional wall motion abnormalities. The left ventricular internal cavity size was normal in size. There is  mild left ventricular hypertrophy. Left ventricular diastolic parameters are indeterminate. Right Ventricle: The right ventricular size is normal. No increase in right ventricular wall thickness. Right ventricular systolic function is normal. There is normal pulmonary artery systolic pressure. The tricuspid regurgitant velocity is 1.90 m/s, and  with an assumed right atrial pressure of 10 mmHg, the estimated right ventricular systolic pressure is 49.6 mmHg. Left Atrium: Left atrial size was normal in size. Right Atrium: Right atrial size was normal in size. Pericardium: Trivial pericardial effusion is present. Mitral Valve: The mitral valve is normal in structure. Normal mobility of the mitral valve leaflets. Mild mitral valve regurgitation. No evidence of mitral valve stenosis. Tricuspid Valve: The tricuspid valve is normal in structure. Tricuspid valve regurgitation is mild . No evidence of tricuspid stenosis. Aortic Valve: The aortic valve is normal in structure. Aortic valve regurgitation is not visualized. No aortic stenosis is present. Aortic valve mean gradient measures 3.5 mmHg. Aortic valve peak gradient measures 6.2 mmHg. Aortic valve area, by VTI measures 2.28 cm. Pulmonic Valve: The pulmonic valve was normal in structure. Pulmonic valve regurgitation is not visualized. No evidence of pulmonic stenosis. Aorta: The aortic root is normal in size and structure. Venous: The inferior vena cava is normal in size with greater than 50% respiratory variability, suggesting right atrial pressure of 3 mmHg. IAS/Shunts: No atrial level shunt detected by color flow Doppler.  LEFT VENTRICLE PLAX 2D LVIDd:         3.90 cm  Diastology LVIDs:         2.90 cm  LV e' lateral:   8.49 cm/s LV PW:         1.01 cm  LV E/e' lateral: 6.7 LV IVS:        0.88  cm  LV e' medial:    4.24 cm/s LVOT diam:     2.00 cm  LV E/e' medial:  13.4 LV SV:         43 LV SV Index:   23 LVOT Area:     3.14 cm  RIGHT VENTRICLE RV Basal diam:  2.77 cm LEFT ATRIUM              Index       RIGHT ATRIUM           Index LA diam:        3.70 cm  1.96 cm/m  RA Area:     18.30 cm LA Vol (A2C):   123.0 ml 65.23 ml/m RA Volume:   49.90 ml  26.46 ml/m LA Vol (A4C):   70.6 ml  37.44 ml/m LA Biplane Vol: 101.0 ml 53.56 ml/m  AORTIC VALVE                   PULMONIC VALVE AV Area (Vmax):    2.10 cm    PV Vmax:  0.88 m/s AV Area (Vmean):   2.02 cm    PV Peak grad:   3.1 mmHg AV Area (VTI):     2.28 cm    RVOT Peak grad: 4 mmHg AV Vmax:           124.00 cm/s AV Vmean:          87.900 cm/s AV VTI:            0.188 m AV Peak Grad:      6.2 mmHg AV Mean Grad:      3.5 mmHg LVOT Vmax:         82.90 cm/s LVOT Vmean:        56.500 cm/s LVOT VTI:          0.136 m LVOT/AV VTI ratio: 0.73  AORTA Ao Root diam: 3.00 cm MITRAL VALVE               TRICUSPID VALVE MV Area (PHT): 7.44 cm    TR Peak grad:   14.4 mmHg MV Decel Time: 102 msec    TR Vmax:        190.00 cm/s MV E velocity: 57.00 cm/s MV A velocity: 78.00 cm/s  SHUNTS MV E/A ratio:  0.73        Systemic VTI:  0.14 m                            Systemic Diam: 2.00 cm Isaias Cowman MD Electronically signed by Isaias Cowman MD Signature Date/Time: 08/21/2019/1:13:15 PM    Final    US Abdomen Limited RUQ  Result Date: 08/19/2019 CLINICAL DATA:  Elevated LFTs EXAM: ULTRASOUND ABDOMEN LIMITED RIGHT UPPER QUADRANT COMPARISON:  None. FINDINGS: Gallbladder: Gallbladder sludge and layering small stones are seen. Gallbladder wall is 3.3 mm. No sonographic Murphy sign noted by sonographer. Common bile duct: Diameter: 5.2 mm Liver: No focal lesion identified. Within normal limits in parenchymal echogenicity. Portal vein is patent on color Doppler imaging with normal direction of blood flow towards the liver. Other: Incidentally noted is a  small right pleural effusion. There is a small amount of perihepatic ascites. There is also mild right pelvicaliectasis present. IMPRESSION: Sludge/gallbladder stones.  No evidence of acute cholecystitis. Small amount of perihepatic ascites and right pleural effusion Mild right hydronephrosis. Electronically Signed   By: Prudencio Pair M.D.   On: 08/19/2019 00:15   US THORACENTESIS ASP PLEURAL SPACE W/IMG GUIDE  Result Date: 08/19/2019 INDICATION: 54 year old female with small right-sided pleural effusion, history of lobular breast carcinoma EXAM: ULTRASOUND GUIDED RIGHT THORACENTESIS MEDICATIONS: None. COMPLICATIONS: None immediate. PROCEDURE: An ultrasound guided thoracentesis was thoroughly discussed with the patient and questions answered. The benefits, risks, alternatives and complications were also discussed. The patient understands and wishes to proceed with the procedure. Written consent was obtained. Ultrasound was performed to localize and mark an adequate pocket of fluid in the right chest. The area was then prepped and draped in the normal sterile fashion. 1% Lidocaine was used for local anesthesia. Under ultrasound guidance a 6 Fr Safe-T-Centesis catheter was introduced. Thoracentesis was performed. The catheter was removed and a dressing applied. FINDINGS: A total of approximately 400 mL of amber colored fluid was removed. Samples were sent to the laboratory as requested by the clinical team. IMPRESSION: Successful ultrasound guided right thoracentesis yielding 400 mL of pleural fluid. Electronically Signed   By: Jacqulynn Cadet M.D.   On: 08/19/2019 16:35   Assessment and  plan- Patient is a 54 y.o. female who presents to symptom management for concerns of a sore and bleeding mouth, dehydration and weakness.   Patient recently hospitalized for acute onset A. fib and hypoxia.  Found to have a malignant pleural effusion. Abemaciclib discontinued secondary to progression of disease. Dr. Rogue Bussing  had previously discussed with patient and family about possibly initiating single agent Taxol if she recovered well from hospitalization. Patient has improved some but given poor tolerance of previous treatment they have opted for hospice care.   Hospice referral sent.   Hyponatremia -sodium level 126.  Proceed with IV fluids  Fatigue/weakness/anorexia-give 10 mg dexamethasone  Plan: Check lab work given patient is not eating or drinking. Will give 500 mL of normal saline along with 10 mg dexamethasone Supportive care. Hospice referral sent-[facility] care  Disposition: Home with hospice.  Visit Diagnosis No diagnosis found.  Patient expressed understanding and was in agreement with this plan. She also understands that She can call clinic at any time with any questions, concerns, or complaints.   Greater than 50% was spent in counseling and coordination of care with this patient including but not limited to discussion of the relevant topics above (See A&P) including, but not limited to diagnosis and management of acute and chronic medical conditions.   Thank you for allowing me to participate in the care of this very pleasant patient.    Jacquelin Hawking, NP [place] [facility] Cell - [phone] Pager- [phone] 08/24/2019 11:10 AM

## 2019-08-24 NOTE — Patient Outreach (Signed)
[place] [facility]) Care Management  08/24/2019  Natasha Dennis [birth year] [id number]   Transition of care telephone call  Referral received:08/19/19 Initial outreach: 08/24/19 Insurance: UMR   Initial unsuccessful telephone call to patient's preferred number in order to complete transition of care assessment; no answer, left HIPAA compliant voicemail message requesting return call.   Objective:  Natasha Dennis  was hospitalized at [facility]  6/15-6/18/21 for Acute Respiratory failure, dehydration, pleural effusion thoracentesis 6/15,  atrial fib, nausea vomiting diarrhea.   . Comorbidities include: metastatic breast cancer,  Protein calorie malnutrition, Anemia, Chemotherapy, GERD.  She  was discharged to home on 08/21/19  with the need for home health services by [facility], home oxygen at 2 liters .    Plan: This RNCM will route unsuccessful outreach letter with [place] Management pamphlet and 24 hour Nurse Advice Line Magnet to [place] Management clinical pool to be mailed to patient's home address. This RNCM will attempt another outreach within 4 business days.   Joylene Draft, RN, BSN  [place] Management Coordinator  [phone]- Mobile [phone]- Toll Free Main Office

## 2019-08-24 NOTE — Telephone Encounter (Signed)
Would you like [place] to bring her in?  Faythe Casa, NP 08/24/2019 8:58 AM

## 2019-08-25 MED ORDER — LIDOCAINE VISCOUS HCL 2 % MT SOLN
15.0000 mL | OROMUCOSAL | 0 refills | Status: AC | PRN
Start: 2019-08-25 — End: ?

## 2019-08-25 MED ORDER — MAGIC MOUTHWASH
5.0000 mL | Freq: Four times a day (QID) | ORAL | 0 refills | Status: AC
Start: 2019-08-25 — End: ?

## 2019-08-26 ENCOUNTER — Encounter: Payer: Self-pay | Admitting: *Deleted

## 2019-08-26 ENCOUNTER — Other Ambulatory Visit: Payer: Self-pay | Admitting: *Deleted

## 2019-08-26 NOTE — Patient Outreach (Addendum)
[place] [facility]) Care Management  08/26/2019  Natasha Dennis Jan 01, 1966 [id number]   Transition of care call/case closure   Referral received:08/19/19 Initial outreach: 08/24/19 Insurance: [employer]    Subjective: 2nd outreach  successful telephone call to patient's preferred number in order to complete transition of care assessment; 2 HIPAA identifiers verified. Explained purpose of call and completed transition of care assessment.  Outreach call to [employer], patient daughter as noted in record to outreach instead of spouse listed as designated release party. Natasha Dennis is with patient and Natasha Dennis gives verbal consent that we speak with Natasha Dennis on her behalf.  Natasha Dennis states that she is feeling a little better on today. She states still not much of an appetite , she is focusing on getting liquids in states everything has a metal taste she had boost on last night that was pretty good. She reports improvement in sore areas in month.  Patient report continuing to wear oxygen at 2 liters and breathing easier. Patient states having a little more strength on today for moving about changing positions. Patient is awaiting Hospice referral visit, she does not plan to resume chemotherapy treatment .  Spouse/children are assisting in her recovery. .    Objective:  Natasha Dennis  was hospitalized at [facility]  6/15-6/18/21 for Acute Respiratory failure, dehydration, pleural effusion thoracentesis 6/15,  atrial fib, nausea vomiting diarrhea.   . Comorbidities include: metastatic breast cancer,  Protein calorie malnutrition, Anemia, Chemotherapy, GERD.  She  was discharged to home on 08/21/19  with the need for home health services by [facility], home oxygen at 2 liters .    Assessment:  Patient voices good understanding of all discharge instructions.  See transition of care flowsheet for assessment details.   Plan:  Reviewed hospital  discharge diagnosis of Acute respiratory failure, dehydration, pleural effusion, atrial fib    and discharge treatment plan using hospital discharge instructions, assessing medication adherence, reviewing problems requiring provider notification, and discussing the importance of follow up with primary care provider and/or specialists as directed. Placed call to [facility], spoke with representative in referrals states they have Hospice referral awaiting additional information from PCP to scheduled initial visit.    Will plan follow up call in the next week, confirm Hospice services in place.     Natasha Draft, RN, BSN  [place] Management Coordinator  [phone]- Mobile [phone]- Toll Free Main Office

## 2019-08-27 ENCOUNTER — Inpatient Hospital Stay: Payer: No Typology Code available for payment source

## 2019-08-28 ENCOUNTER — Telehealth: Payer: Self-pay | Admitting: *Deleted

## 2019-08-28 ENCOUNTER — Inpatient Hospital Stay: Payer: No Typology Code available for payment source | Admitting: Family Medicine

## 2019-08-28 NOTE — Telephone Encounter (Signed)
She received one dose of IV steroids while in clinic. No additional steroids were ordered.   Faythe Casa, NP 08/28/2019 11:22 AM

## 2019-08-28 NOTE — Telephone Encounter (Signed)
Hospice nurse Lattie Haw called stating that when patient was in office, she was told that she was going to be ordered steroids, but per patient, the pharmacy did not get prescription for it. Please advise if you are going to order steroids for her

## 2019-08-31 ENCOUNTER — Other Ambulatory Visit: Payer: Self-pay | Admitting: Internal Medicine

## 2019-08-31 ENCOUNTER — Ambulatory Visit: Payer: No Typology Code available for payment source | Admitting: Internal Medicine

## 2019-08-31 ENCOUNTER — Other Ambulatory Visit: Payer: No Typology Code available for payment source

## 2019-08-31 ENCOUNTER — Ambulatory Visit: Payer: No Typology Code available for payment source

## 2019-08-31 ENCOUNTER — Other Ambulatory Visit: Payer: Self-pay | Admitting: *Deleted

## 2019-08-31 DIAGNOSIS — F4322 Adjustment disorder with anxiety: Secondary | ICD-10-CM

## 2019-08-31 NOTE — Patient Outreach (Signed)
[place] [facility]) Care Management  08/31/2019  [name] 03-Oct-1965 [id number]   Transition of care call/case closure  Referral received: 08/19/19 Initial outreach attempt: 08/24/19 Insurance: UMR    Subjective: Outreach call to [employer], spoke with representative Lemmie Evens, she confirms patient was enrolled in Hospice services as of 08/27/19.  Outreach call to patient daughter Natasha Dennis, she confirms Hospice service in place, explained that they will be managing her care and [facility] care management services will close, she expressed understanding.     Objective: Natasha Dennis hospitalized at [facility] 6/15-6/18/21 for Acute Respiratory failure, dehydration, pleural effusion thoracentesis 6/15, atrial fib, nausea vomiting diarrhea. . Comorbidities include: metastatic breast cancer, Protein calorie malnutrition, Anemia, Chemotherapy, GERD. She was discharged to home on 6/18/21with the need for home health services by [facility], home oxygen at 2 liters .  Plan Will plan case closure ,patient enrolled in home Hospice services with [facility].   Joylene Draft, RN, BSN  [place] Management Coordinator  [phone]- Mobile [phone]- Toll Free Main Office

## 2019-09-01 ENCOUNTER — Other Ambulatory Visit: Payer: Self-pay | Admitting: Hospice and Palliative Medicine

## 2019-09-01 DIAGNOSIS — F4322 Adjustment disorder with anxiety: Secondary | ICD-10-CM

## 2019-09-01 MED ORDER — ALPRAZOLAM 1 MG PO TABS: 1.0000 mg | ORAL_TABLET | Freq: Three times a day (TID) | ORAL | 0 refills | Status: AC | PRN

## 2019-09-01 MED ORDER — MORPHINE SULFATE (CONCENTRATE) 10 MG /0.5 ML PO SOLN: 5.0000 mg | ORAL | 0 refills | Status: AC | PRN

## 2019-09-01 NOTE — Progress Notes (Signed)
I spoke with patient's hospice nurse. Patient is preparing for a trip to the beach. Nurse requested that we Rx morphine elixir to have on hand in the event of pain/dyspnea. Will send rx to pharmacy. Nurse also reports that patient has had worsened LE edema L>R. Apparently, patient has some chronic LE edema. She is anticoagulated with Eliquis. However, we discussed the possible option of workup including LE dopplers. Patient has told the nurse that she wants to avoid clinic visits/hospitalization. She plans to speak with patient to discuss goals.

## 2019-09-10 ENCOUNTER — Telehealth: Payer: Self-pay | Admitting: Internal Medicine

## 2019-09-10 NOTE — Telephone Encounter (Signed)
Spoke with Daughter Chrystal. Condolences offered. Today is the patient's funeral service. Briefly discussed the need for genetics. She will call back at a later time when she is able to speak to Dr. B re: the need for genetics.

## 2019-09-10 NOTE — Telephone Encounter (Signed)
On 7/7-I tried to reach patient's daughter Crystal to offer condolences and to discuss BRCA-2 positive results noted on NGS.  Family will need genetic counseling/testing.   Mailbox full-unable to leave voicemail.   Heather-please reach out to patient's daughter/husband.

## 2019-09-23 ENCOUNTER — Telehealth: Payer: Self-pay | Admitting: Internal Medicine

## 2019-09-23 NOTE — Telephone Encounter (Signed)
On 7/21- spoke to daughter re: genetics counseling; given mom's BRCA on Foundation-1. Pt's daughter agreement.  GB

## 2019-10-04 DEATH — deceased

## 2019-10-23 ENCOUNTER — Encounter: Payer: No Typology Code available for payment source | Admitting: Family Medicine

## 2019-10-23 ENCOUNTER — Encounter: Payer: No Typology Code available for payment source | Admitting: Nurse Practitioner

## 2019-11-12 ENCOUNTER — Ambulatory Visit: Payer: Self-pay | Admitting: Urology

## 2021-03-31 IMAGING — CT CT ABD-PELV W/ CM
2 of 5 series · 15 of 46 positions shown, 17 images · IV contrast (omnipaque)
Comparison: 08/19/2010

CLINICAL DATA: Mid abdominal pain for several months, palpable
abnormality, loose stool, personal history of left breast cancer
status post mastectomy

EXAM:
CT ABDOMEN AND PELVIS WITH CONTRAST
TECHNIQUE: Multidetector CT imaging of the abdomen and pelvis was performed
using the standard protocol following bolus administration of
intravenous contrast.
CONTRAST:  100mL OMNIPAQUE IOHEXOL 300 MG/ML  SOLN

[Series 2: abd pelvis 5.00 · axial · 0.82mm/px · z∈[-1480,-1075]mm · 12 of 91 slices shown, 14 images]
[im 5/91  soft-tissue]
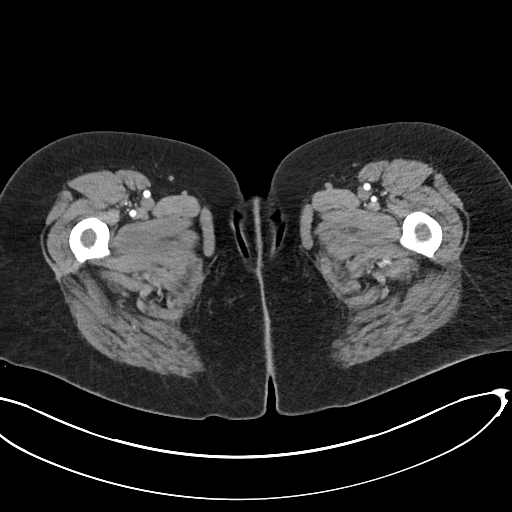
[im 5/91  bone]
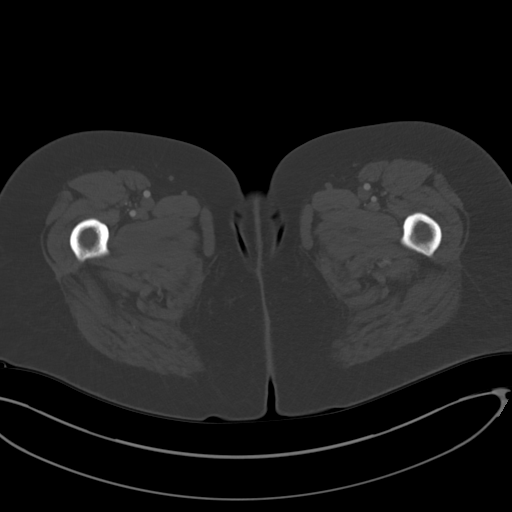
[im 15/91  soft-tissue]
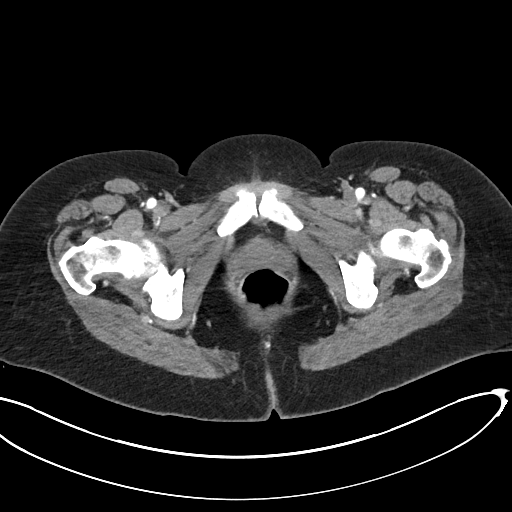
[im 19/91  soft-tissue]
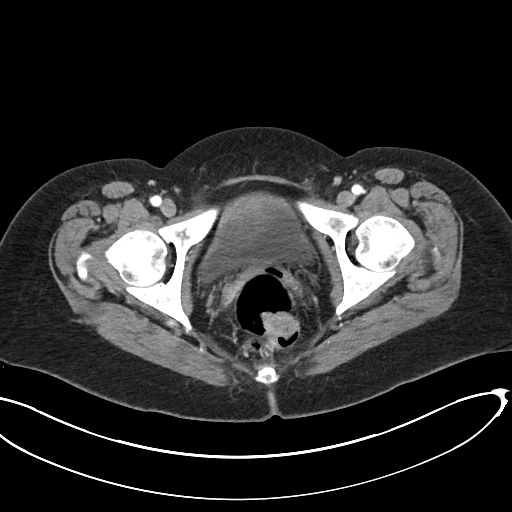
[im 29/91  soft-tissue]
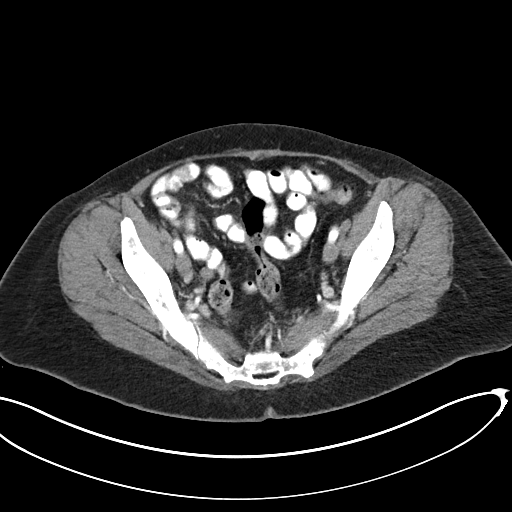
[im 34/91  soft-tissue]
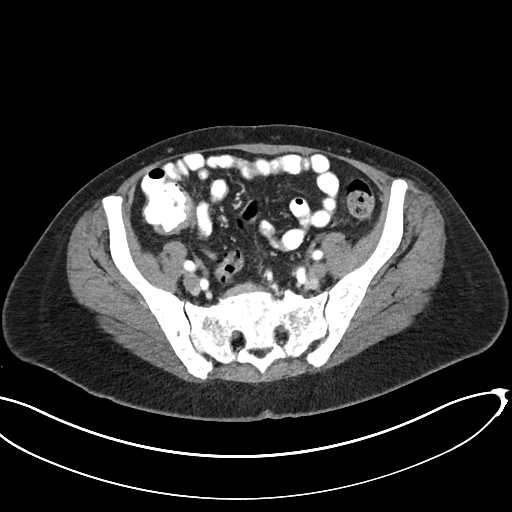
[im 43/91  soft-tissue]
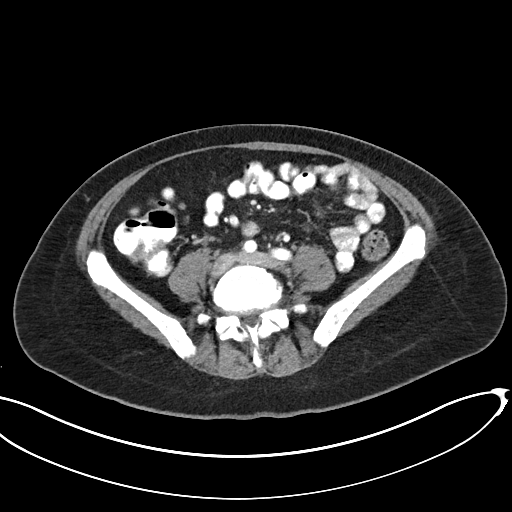
[im 48/91  soft-tissue]
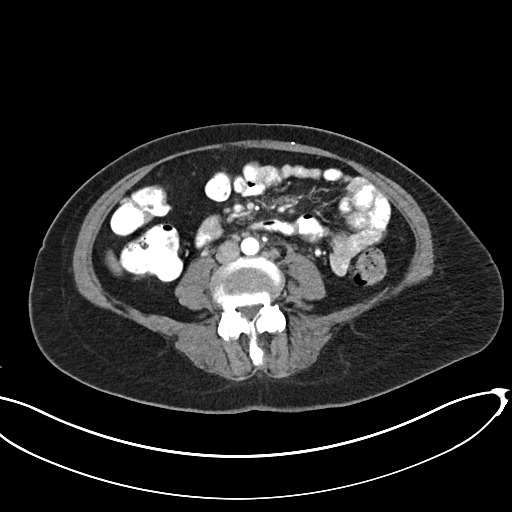
[im 57/91  soft-tissue]
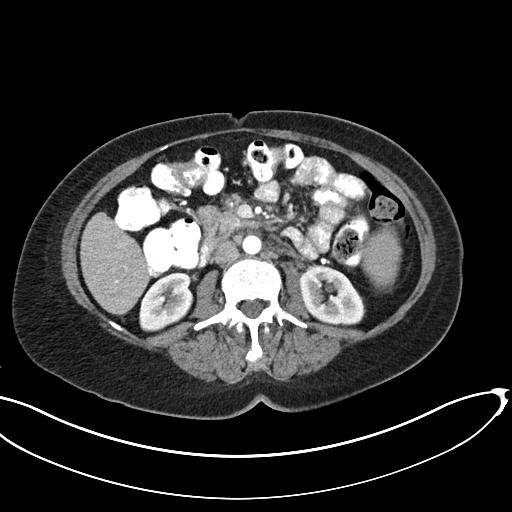
[im 62/91  soft-tissue]
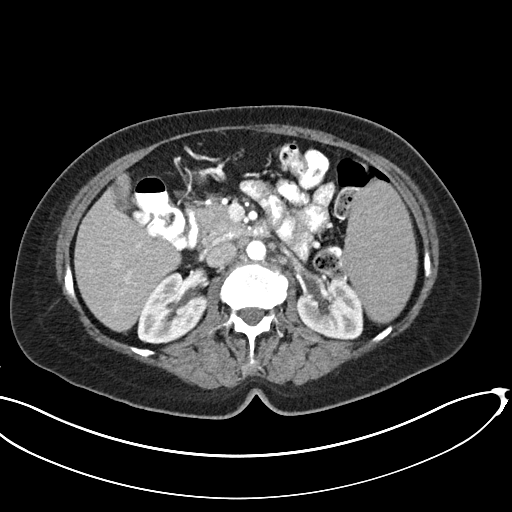
[im 62/91  bone]
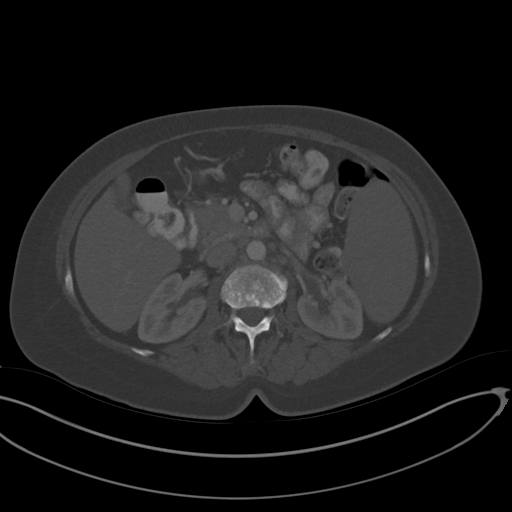
[im 72/91  soft-tissue]
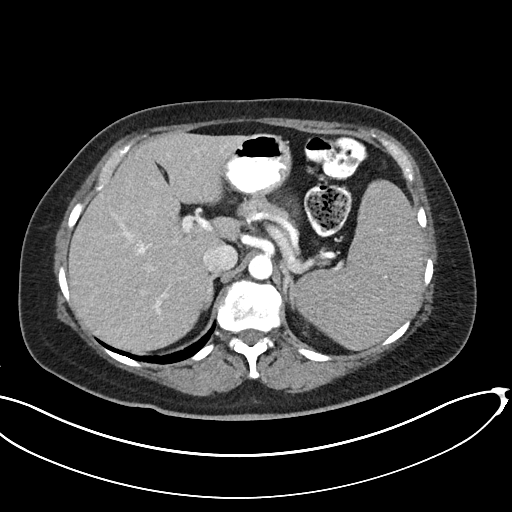
[im 76/91  soft-tissue]
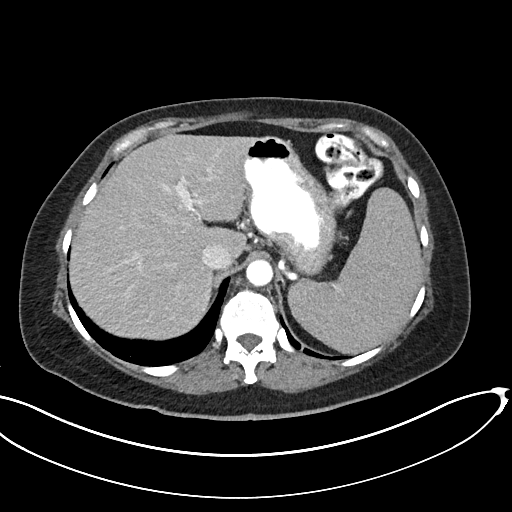
[im 86/91  soft-tissue]
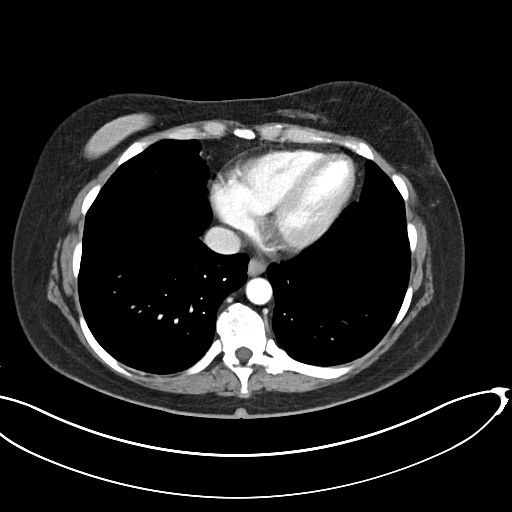

[Series 4: coronals abd pelvis 2.00 cor · coronal · 0.82mm/px · 3 of 144 slices shown]
[im 48/144  soft-tissue]
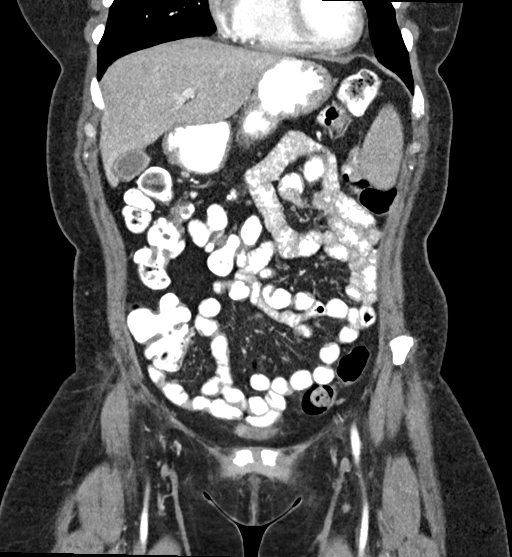
[im 64/144  soft-tissue]
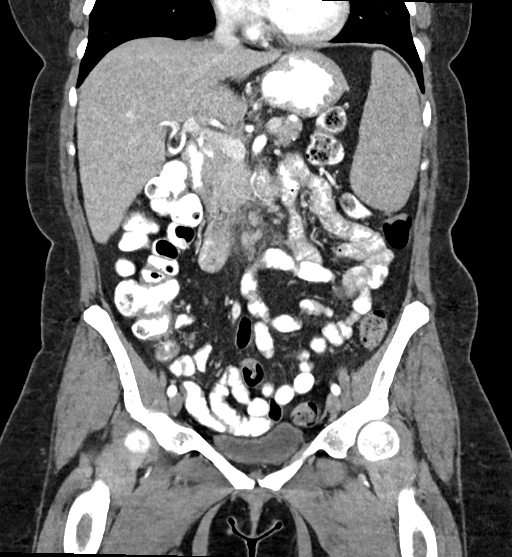
[im 80/144  soft-tissue]
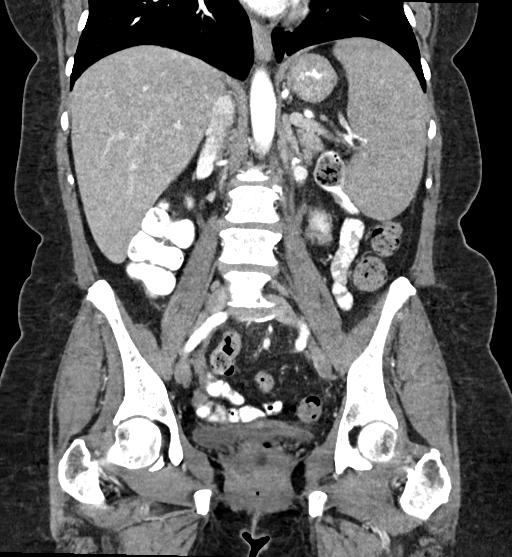

[15 of 46 positions shown; findings below may reference images not displayed]

FINDINGS: Lower chest: No acute pleural or parenchymal lung disease.

Hepatobiliary: Gallbladder is decompressed, limiting evaluation.
Enhancement of the gallbladder mucosa is noted. No evidence of
cholelithiasis. No pericholecystic fat stranding. Liver is
unremarkable.

Pancreas: There is abnormal soft tissue density in fat stranding at
the root of mesentery extending to the uncinate process of the
pancreas. There is adjacent wall thickening within the duodenal
bulb. No discrete mass can be identified.

Spleen: Spleen is enlarged measuring 15.2 cm in craniocaudal length.
No focal parenchymal abnormality.

Adrenals/Urinary Tract: The adrenals appear normal. Kidneys enhance
normally and symmetrically. No urinary tract calculi. The bladder is
unremarkable.

Stomach/Bowel: As stated above, there is circumferential wall
thickening of the duodenal bulb, best seen on axial images
thirty-three through 42 and on coronal image 55 through 61. There is
loss of normal fat plane between the proximal duodenum and
pancreatic head.

I do not see any bowel obstruction or ileus.

Vascular/Lymphatic: Numerous small lymph nodes are seen at the root
of the mesentery, at the area of mesenteric stranding, ill-defined
uncinate process of the pancreas, and circumferential duodenal wall
thickening. Lymph nodes are all less than 1 cm in size.

Vascular structures are grossly unremarkable. No significant aortic
atherosclerosis. The splenic vein, portal vein, and superior
mesenteric vein enhance normally.

Reproductive: Status post hysterectomy. No adnexal masses.

Other: No free fluid or free gas.  No abdominal wall hernia.

Musculoskeletal: Diffuse sclerotic lesions are seen throughout the
axial and appendicular skeleton, consistent with bony metastatic
disease. No pathologic fracture. Reconstructed images demonstrate no
additional findings.
IMPRESSION: 1. Interval development of innumerable sclerotic lesions throughout
the axial and appendicular skeleton, consistent with bony metastatic
disease. Bone scan may be useful for further evaluation.
2. Abnormal soft tissue density at the root of the mesentery, with
loss of normal fat planes between the uncinate process of the
pancreatic head/uncinate process and duodenal bulb. Circumferential
wall thickening of the proximal duodenum as above. Overall, I would
favor neoplasm rather than underlying inflammatory change such as
pancreatitis. MRI with without contrast may be useful for further
evaluation in this area.
3. Splenomegaly.
4. Numerous subcentimeter lymph nodes at the root of the mesentery,
nonspecific. Given the bony findings and adjacent desmoplastic
appearance of the mesentery, neoplasm cannot be excluded. PET-CT may
be useful for further evaluation.

These results will be called to the ordering clinician or
representative by the Radiologist Assistant, and communication
documented in the PACS or [REDACTED].

## 2021-04-05 IMAGING — CT CT BIOPSY BONE MARROW
1 of 2 series · 9 of 14 positions shown, 12 images · non-contrast
Comparison: none

INDICATION: 53-year-old with thrombocytopenia, history of breast cancer and
diffuse sclerotic bone metastasis.

[Series 2: i-spiral 5.0 b30f · axial · 0.66mm/px · z∈[+650,+762]mm · 9 of 41 slices shown, 12 images]
[im 5/41  soft-tissue]
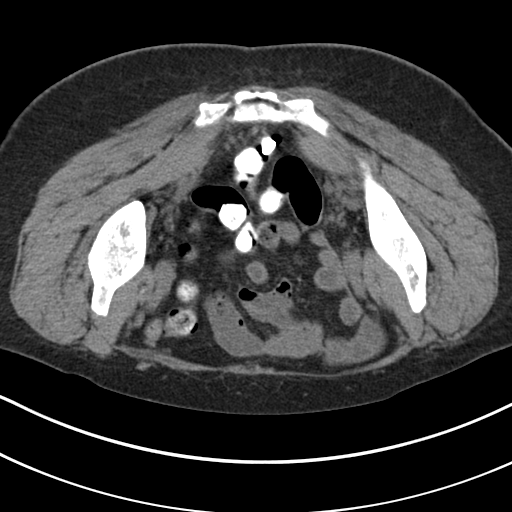
[im 5/41  bone]
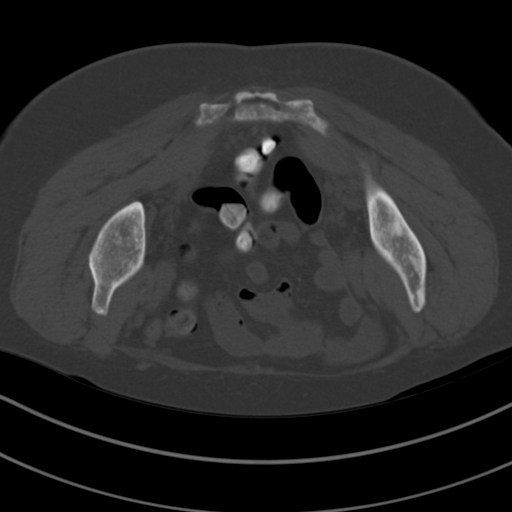
[im 9/41  bone]
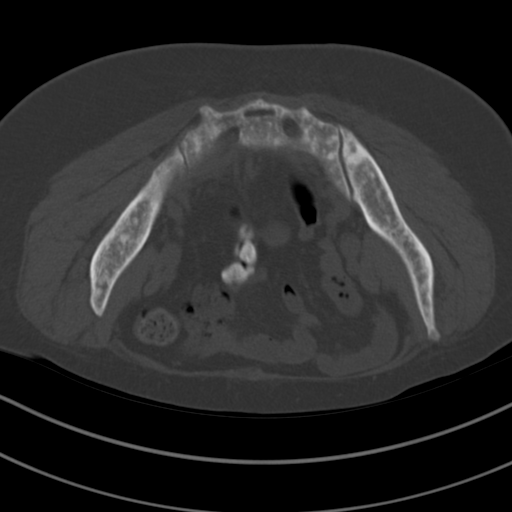
[im 13/41  bone]
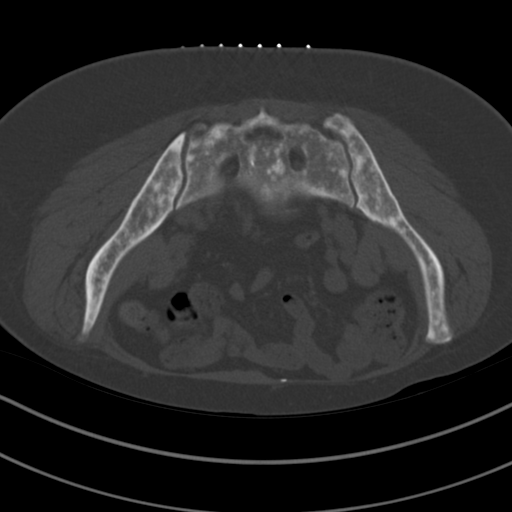
[im 17/41  bone]
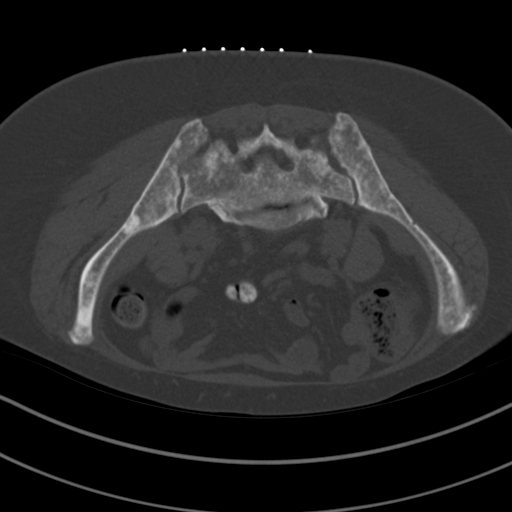
[im 21/41  soft-tissue]
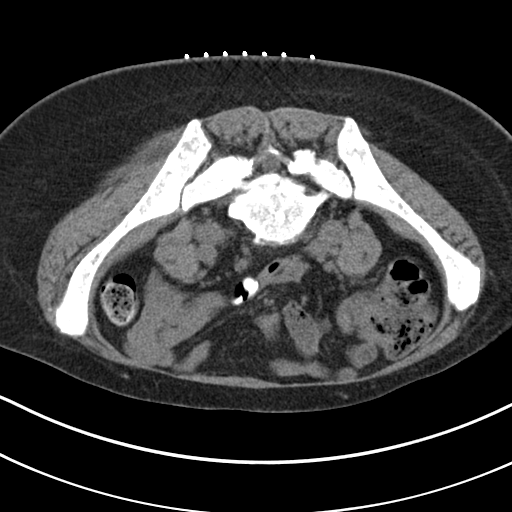
[im 21/41  bone]
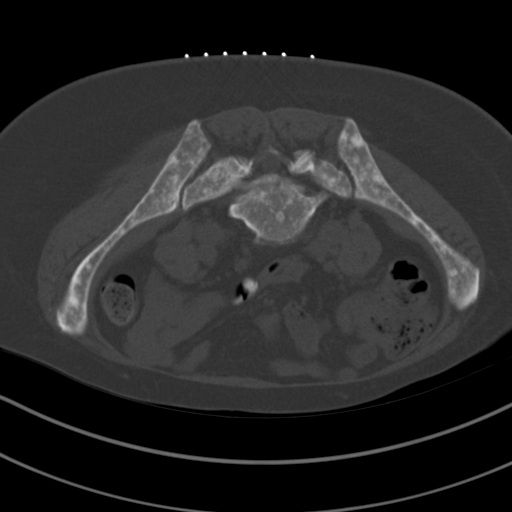
[im 25/41  bone]
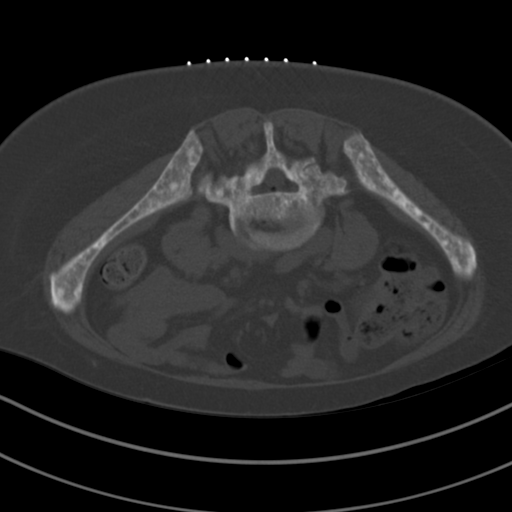
[im 29/41  bone]
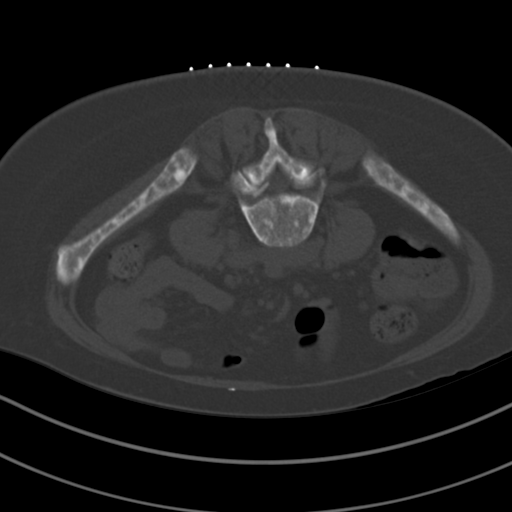
[im 33/41  bone]
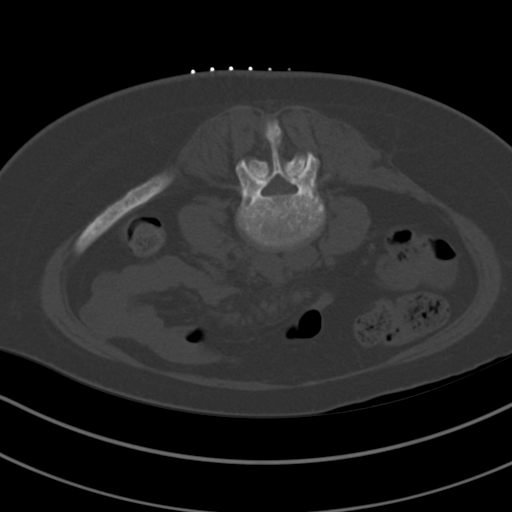
[im 37/41  soft-tissue]
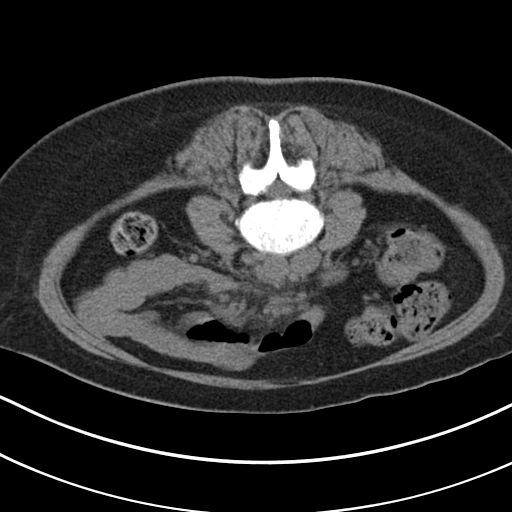
[im 37/41  bone]
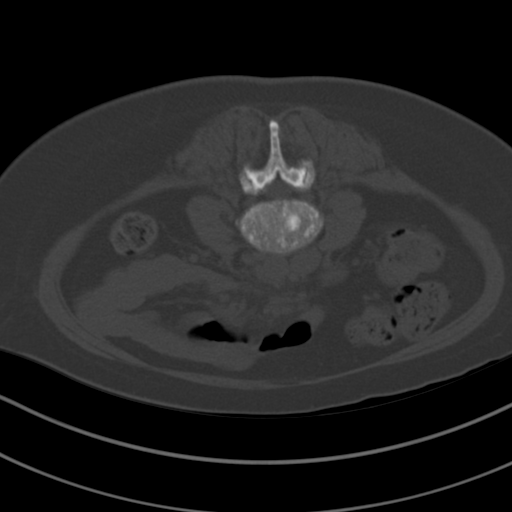

[9 of 14 positions shown; findings below may reference images not displayed]

EXAM:
CT GUIDED BONE MARROW BIOPSY

MEDICATIONS:
None.

ANESTHESIA/SEDATION:
Fentanyl 50 mcg IV; Versed 1 mg IV

Moderate Sedation Time:  19 minutes

The patient was continuously monitored during the procedure by the
interventional radiology nurse under my direct supervision.

COMPLICATIONS:
None immediate.

PROCEDURE:
The procedure was explained to the patient. The risks and benefits
of the procedure were discussed and the patient's questions were
addressed. Informed consent was obtained from the patient. The
patient was placed prone on CT table. Images of the pelvis were
obtained. The right side of back was prepped and draped in sterile
fashion. The skin and right posterior ilium were anesthetized with
1% lidocaine. 11 gauge bone needle was directed into the right ilium
with CT guidance. No blood could be obtained for an aspirate. As a
result, 2 core biopsies were obtained. Bandage placed over the
puncture site.
FINDINGS: Extensive sclerotic lesions throughout the pelvis compatible with
metastatic disease.
IMPRESSION: CT guided bone marrow biopsy.
